# Patient Record
Sex: Male | Born: 1965 | Race: Black or African American | Hispanic: No | Marital: Single | Smoking: Former smoker
Health system: Southern US, Community
[De-identification: ages and names within clinical notes are randomized; demographics above are authoritative.]

## PROBLEM LIST (undated history)

## (undated) DIAGNOSIS — G43909 Migraine, unspecified, not intractable, without status migrainosus: Secondary | ICD-10-CM

## (undated) DIAGNOSIS — F10239 Alcohol dependence with withdrawal, unspecified: Secondary | ICD-10-CM

## (undated) DIAGNOSIS — Z89429 Acquired absence of other toe(s), unspecified side: Secondary | ICD-10-CM

## (undated) DIAGNOSIS — I671 Cerebral aneurysm, nonruptured: Secondary | ICD-10-CM

## (undated) DIAGNOSIS — M1990 Unspecified osteoarthritis, unspecified site: Secondary | ICD-10-CM

## (undated) DIAGNOSIS — F10939 Alcohol use, unspecified with withdrawal, unspecified: Secondary | ICD-10-CM

## (undated) DIAGNOSIS — K7031 Alcoholic cirrhosis of liver with ascites: Secondary | ICD-10-CM

## (undated) DIAGNOSIS — R569 Unspecified convulsions: Secondary | ICD-10-CM

## (undated) DIAGNOSIS — Z7289 Other problems related to lifestyle: Secondary | ICD-10-CM

## (undated) HISTORY — DX: Unspecified osteoarthritis, unspecified site: M19.90

## (undated) HISTORY — DX: Alcohol dependence with withdrawal, unspecified: F10.239

## (undated) HISTORY — DX: Cerebral aneurysm, nonruptured: I67.1

## (undated) HISTORY — PX: TOE AMPUTATION: SHX809

## (undated) HISTORY — DX: Alcohol use, unspecified with withdrawal, unspecified: F10.939

## (undated) HISTORY — PX: POLYPECTOMY: SHX149

## (undated) HISTORY — PX: COLONOSCOPY: SHX174

## (undated) HISTORY — DX: Acquired absence of other toe(s), unspecified side: Z89.429

## (undated) HISTORY — PX: OTHER SURGICAL HISTORY: SHX169

## (undated) HISTORY — DX: Unspecified convulsions: R56.9

## (undated) HISTORY — DX: Alcoholic cirrhosis of liver with ascites: K70.31

---
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
## 1988-12-11 HISTORY — PX: BACK SURGERY: SHX140

## 1999-07-20 ENCOUNTER — Emergency Department (HOSPITAL_COMMUNITY): Admission: EM | Admit: 1999-07-20 | Discharge: 1999-07-20 | Payer: Self-pay | Admitting: Emergency Medicine

## 1999-07-21 ENCOUNTER — Encounter: Payer: Self-pay | Admitting: Nephrology

## 1999-07-21 ENCOUNTER — Ambulatory Visit (HOSPITAL_COMMUNITY): Admission: RE | Admit: 1999-07-21 | Discharge: 1999-07-21 | Payer: Self-pay | Admitting: Nephrology

## 1999-12-19 ENCOUNTER — Emergency Department (HOSPITAL_COMMUNITY): Admission: EM | Admit: 1999-12-19 | Discharge: 1999-12-19 | Payer: Self-pay | Admitting: Emergency Medicine

## 2001-09-21 ENCOUNTER — Encounter: Payer: Self-pay | Admitting: Emergency Medicine

## 2001-09-21 ENCOUNTER — Emergency Department (HOSPITAL_COMMUNITY): Admission: EM | Admit: 2001-09-21 | Discharge: 2001-09-21 | Payer: Self-pay | Admitting: Emergency Medicine

## 2002-11-09 ENCOUNTER — Encounter: Payer: Self-pay | Admitting: Emergency Medicine

## 2002-11-09 ENCOUNTER — Emergency Department (HOSPITAL_COMMUNITY): Admission: EM | Admit: 2002-11-09 | Discharge: 2002-11-09 | Payer: Self-pay | Admitting: Emergency Medicine

## 2004-09-05 ENCOUNTER — Emergency Department (HOSPITAL_COMMUNITY): Admission: EM | Admit: 2004-09-05 | Discharge: 2004-09-05 | Payer: Self-pay | Admitting: Emergency Medicine

## 2004-10-25 ENCOUNTER — Emergency Department (HOSPITAL_COMMUNITY): Admission: EM | Admit: 2004-10-25 | Discharge: 2004-10-25 | Payer: Self-pay | Admitting: Emergency Medicine

## 2005-03-27 ENCOUNTER — Inpatient Hospital Stay (HOSPITAL_COMMUNITY): Admission: RE | Admit: 2005-03-27 | Discharge: 2005-03-31 | Payer: Self-pay | Admitting: Psychiatry

## 2005-03-27 ENCOUNTER — Ambulatory Visit: Payer: Self-pay | Admitting: Psychiatry

## 2005-06-10 ENCOUNTER — Emergency Department (HOSPITAL_COMMUNITY): Admission: EM | Admit: 2005-06-10 | Discharge: 2005-06-10 | Payer: Self-pay | Admitting: Emergency Medicine

## 2005-12-28 ENCOUNTER — Emergency Department (HOSPITAL_COMMUNITY): Admission: EM | Admit: 2005-12-28 | Discharge: 2005-12-28 | Payer: Self-pay | Admitting: Emergency Medicine

## 2006-06-17 ENCOUNTER — Emergency Department (HOSPITAL_COMMUNITY): Admission: EM | Admit: 2006-06-17 | Discharge: 2006-06-17 | Payer: Self-pay | Admitting: *Deleted

## 2014-06-15 ENCOUNTER — Emergency Department (HOSPITAL_COMMUNITY): Payer: Medicaid Other

## 2014-06-15 ENCOUNTER — Emergency Department (HOSPITAL_COMMUNITY)
Admission: EM | Admit: 2014-06-15 | Discharge: 2014-06-16 | Disposition: A | Discharge status: Home / Self Care | Payer: Medicaid Other | Attending: Emergency Medicine | Admitting: Emergency Medicine

## 2014-06-15 ENCOUNTER — Encounter (HOSPITAL_COMMUNITY): Payer: Self-pay | Admitting: Emergency Medicine

## 2014-06-15 DIAGNOSIS — IMO0002 Reserved for concepts with insufficient information to code with codable children: Secondary | ICD-10-CM | POA: Insufficient documentation

## 2014-06-15 DIAGNOSIS — S8990XA Unspecified injury of unspecified lower leg, initial encounter: Secondary | ICD-10-CM | POA: Insufficient documentation

## 2014-06-15 DIAGNOSIS — R296 Repeated falls: Secondary | ICD-10-CM | POA: Insufficient documentation

## 2014-06-15 DIAGNOSIS — S0990XA Unspecified injury of head, initial encounter: Secondary | ICD-10-CM | POA: Insufficient documentation

## 2014-06-15 DIAGNOSIS — S199XXA Unspecified injury of neck, initial encounter: Secondary | ICD-10-CM | POA: Diagnosis not present

## 2014-06-15 DIAGNOSIS — Z79899 Other long term (current) drug therapy: Secondary | ICD-10-CM | POA: Insufficient documentation

## 2014-06-15 DIAGNOSIS — F1010 Alcohol abuse, uncomplicated: Secondary | ICD-10-CM | POA: Insufficient documentation

## 2014-06-15 DIAGNOSIS — I671 Cerebral aneurysm, nonruptured: Secondary | ICD-10-CM | POA: Insufficient documentation

## 2014-06-15 DIAGNOSIS — F17200 Nicotine dependence, unspecified, uncomplicated: Secondary | ICD-10-CM | POA: Diagnosis not present

## 2014-06-15 DIAGNOSIS — Y9389 Activity, other specified: Secondary | ICD-10-CM | POA: Diagnosis not present

## 2014-06-15 DIAGNOSIS — F10920 Alcohol use, unspecified with intoxication, uncomplicated: Secondary | ICD-10-CM

## 2014-06-15 DIAGNOSIS — Y92410 Unspecified street and highway as the place of occurrence of the external cause: Secondary | ICD-10-CM | POA: Diagnosis not present

## 2014-06-15 DIAGNOSIS — S0993XA Unspecified injury of face, initial encounter: Secondary | ICD-10-CM | POA: Diagnosis not present

## 2014-06-15 DIAGNOSIS — Z9889 Other specified postprocedural states: Secondary | ICD-10-CM | POA: Insufficient documentation

## 2014-06-15 DIAGNOSIS — S99919A Unspecified injury of unspecified ankle, initial encounter: Secondary | ICD-10-CM

## 2014-06-15 DIAGNOSIS — S99929A Unspecified injury of unspecified foot, initial encounter: Secondary | ICD-10-CM

## 2014-06-15 LAB — I-STAT CHEM 8, ED
BUN: 4 mg/dL — ABNORMAL LOW (ref 6–23)
CALCIUM ION: 1.09 mmol/L — AB (ref 1.12–1.23)
CREATININE: 1.1 mg/dL (ref 0.50–1.35)
Chloride: 100 mEq/L (ref 96–112)
GLUCOSE: 89 mg/dL (ref 70–99)
HCT: 40 % (ref 39.0–52.0)
HEMOGLOBIN: 13.6 g/dL (ref 13.0–17.0)
Potassium: 3.5 mEq/L — ABNORMAL LOW (ref 3.7–5.3)
SODIUM: 140 meq/L (ref 137–147)
TCO2: 24 mmol/L (ref 0–100)

## 2014-06-15 LAB — ETHANOL: Alcohol, Ethyl (B): 365 mg/dL — ABNORMAL HIGH (ref 0–11)

## 2014-06-15 MED ORDER — IOHEXOL 350 MG/ML SOLN
100.0000 mL | Freq: Once | INTRAVENOUS | Status: AC | PRN
  Administered 2014-06-15: 100 mL via INTRAVENOUS
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
## 2014-06-15 NOTE — ED Notes (Signed)
Pt. Returned from radiology.

## 2014-06-16 NOTE — Discharge Instructions (Signed)
Alcohol Intoxication Follow up with Dr. Estanislado Pandy regarding the possible aneurysm in your brain. Return to the ED if you develop new or worsening symptoms. Alcohol intoxication occurs when the amount of alcohol that a person has consumed impairs his or her ability to mentally and physically function. Alcohol directly impairs the normal chemical activity of the brain. Drinking large amounts of alcohol can lead to changes in mental function and behavior, and it can cause many physical effects that can be harmful.  Alcohol intoxication can range in severity from mild to very severe. Various factors can affect the level of intoxication that occurs, such as the person's age, gender, weight, frequency of alcohol consumption, and the presence of other medical conditions (such as diabetes, seizures, or heart conditions). Dangerous levels of alcohol intoxication may occur when people drink large amounts of alcohol in a short period (binge drinking). Alcohol can also be especially dangerous when combined with certain prescription medicines or "recreational" drugs. SIGNS AND SYMPTOMS Some common signs and symptoms of mild alcohol intoxication include:  Loss of coordination.  Changes in mood and behavior.  Impaired judgment.  Slurred speech. As alcohol intoxication progresses to more severe levels, other signs and symptoms will appear. These may include:  Vomiting.  Confusion and impaired memory.  Slowed breathing.  Seizures.  Loss of consciousness. DIAGNOSIS  Your health care provider will take a medical history and perform a physical exam. You will be asked about the amount and type of alcohol you have consumed. Blood tests will be done to measure the concentration of alcohol in your blood. In many places, your blood alcohol level must be lower than 80 mg/dL (0.08%) to legally drive. However, many dangerous effects of alcohol can occur at much lower levels.  TREATMENT  People with alcohol  intoxication often do not require treatment. Most of the effects of alcohol intoxication are temporary, and they go away as the alcohol naturally leaves the body. Your health care provider will monitor your condition until you are stable enough to go home. Fluids are sometimes given through an IV access tube to help prevent dehydration.  HOME CARE INSTRUCTIONS  Do not drive after drinking alcohol.  Stay hydrated. Drink enough water and fluids to keep your urine clear or pale yellow. Avoid caffeine.   Only take over-the-counter or prescription medicines as directed by your health care provider.  SEEK MEDICAL CARE IF:   You have persistent vomiting.   You do not feel better after a few days.  You have frequent alcohol intoxication. Your health care provider can help determine if you should see a substance use treatment counselor. SEEK IMMEDIATE MEDICAL CARE IF:   You become shaky or tremble when you try to stop drinking.   You shake uncontrollably (seizure).   You throw up (vomit) blood. This may be bright red or may look like black coffee grounds.   You have blood in your stool. This may be bright red or may appear as a black, tarry, bad smelling stool.   You become lightheaded or faint.  MAKE SURE YOU:   Understand these instructions.  Will watch your condition.  Will get help right away if you are not doing well or get worse. Document Released: 09/06/2005 Document Revised: 07/30/2013 Document Reviewed: 05/02/2013 [facility] Patient Information 2015 [place], [place]. This information is not intended to replace advice given to you by your health care provider. Make sure you discuss any questions you have with your health care provider.

## 2014-06-16 NOTE — ED Notes (Signed)
Pt ambulated with no difficulty

## 2014-06-25 ENCOUNTER — Other Ambulatory Visit (HOSPITAL_COMMUNITY): Payer: Self-pay | Admitting: Emergency Medicine

## 2014-06-25 ENCOUNTER — Telehealth (HOSPITAL_COMMUNITY): Payer: Self-pay | Admitting: Interventional Radiology

## 2014-06-25 DIAGNOSIS — I729 Aneurysm of unspecified site: Secondary | ICD-10-CM

## 2014-06-25 NOTE — Telephone Encounter (Signed)
Called pt, spoke to his mother. Asked her to have pt call me to schedule a consultation to discuss his recent incidental finding of a RMCA aneurysm. She states she will have him return my call. JM

## 2014-07-13 ENCOUNTER — Ambulatory Visit (HOSPITAL_COMMUNITY): Payer: No Typology Code available for payment source

## 2014-08-11 ENCOUNTER — Ambulatory Visit (HOSPITAL_COMMUNITY): Admission: RE | Admit: 2014-08-11 | Payer: No Typology Code available for payment source | Source: Ambulatory Visit

## 2014-08-14 ENCOUNTER — Ambulatory Visit (HOSPITAL_COMMUNITY): Admission: RE | Admit: 2014-08-14 | Payer: No Typology Code available for payment source | Source: Ambulatory Visit

## 2014-09-01 ENCOUNTER — Telehealth (HOSPITAL_COMMUNITY): Payer: Self-pay | Admitting: Interventional Radiology

## 2014-09-01 NOTE — Telephone Encounter (Signed)
Attempted to call pt again to reschedule his consult for new dx of aneurysm. No answer and no VM. I also called the other number listed on his chart which belongs to his aunt, Ms. Wynetta Emery. She stated that the best way to contact Mr. Careaga would be to call his mother's home phone. I will try back. JM

## 2015-08-07 ENCOUNTER — Inpatient Hospital Stay (HOSPITAL_COMMUNITY): Payer: Medicaid Other

## 2015-08-07 ENCOUNTER — Inpatient Hospital Stay (HOSPITAL_COMMUNITY)
Admission: EM | Admit: 2015-08-07 | Discharge: 2015-08-10 | DRG: 897 | Disposition: A | Discharge status: Home / Self Care | Payer: Medicaid Other | Attending: Internal Medicine | Admitting: Internal Medicine

## 2015-08-07 ENCOUNTER — Encounter (HOSPITAL_COMMUNITY): Payer: Self-pay | Admitting: Emergency Medicine

## 2015-08-07 DIAGNOSIS — K920 Hematemesis: Secondary | ICD-10-CM

## 2015-08-07 DIAGNOSIS — I671 Cerebral aneurysm, nonruptured: Secondary | ICD-10-CM

## 2015-08-07 DIAGNOSIS — F1010 Alcohol abuse, uncomplicated: Secondary | ICD-10-CM | POA: Diagnosis not present

## 2015-08-07 DIAGNOSIS — F10239 Alcohol dependence with withdrawal, unspecified: Secondary | ICD-10-CM | POA: Diagnosis present

## 2015-08-07 DIAGNOSIS — F17210 Nicotine dependence, cigarettes, uncomplicated: Secondary | ICD-10-CM | POA: Diagnosis present

## 2015-08-07 DIAGNOSIS — F1290 Cannabis use, unspecified, uncomplicated: Secondary | ICD-10-CM | POA: Diagnosis present

## 2015-08-07 DIAGNOSIS — F10230 Alcohol dependence with withdrawal, uncomplicated: Secondary | ICD-10-CM | POA: Diagnosis not present

## 2015-08-07 DIAGNOSIS — E872 Acidosis: Secondary | ICD-10-CM

## 2015-08-07 DIAGNOSIS — E871 Hypo-osmolality and hyponatremia: Secondary | ICD-10-CM | POA: Diagnosis present

## 2015-08-07 DIAGNOSIS — R569 Unspecified convulsions: Secondary | ICD-10-CM | POA: Insufficient documentation

## 2015-08-07 DIAGNOSIS — Z79899 Other long term (current) drug therapy: Secondary | ICD-10-CM

## 2015-08-07 DIAGNOSIS — E860 Dehydration: Secondary | ICD-10-CM | POA: Diagnosis present

## 2015-08-07 DIAGNOSIS — I10 Essential (primary) hypertension: Secondary | ICD-10-CM | POA: Diagnosis present

## 2015-08-07 DIAGNOSIS — F10939 Alcohol use, unspecified with withdrawal, unspecified: Secondary | ICD-10-CM | POA: Insufficient documentation

## 2015-08-07 DIAGNOSIS — E876 Hypokalemia: Secondary | ICD-10-CM | POA: Diagnosis present

## 2015-08-07 DIAGNOSIS — E8729 Other acidosis: Secondary | ICD-10-CM

## 2015-08-07 LAB — COMPREHENSIVE METABOLIC PANEL
ALT: 53 U/L (ref 17–63)
ANION GAP: 17 — AB (ref 5–15)
AST: 99 U/L — AB (ref 15–41)
Albumin: 4.2 g/dL (ref 3.5–5.0)
Alkaline Phosphatase: 69 U/L (ref 38–126)
BILIRUBIN TOTAL: 0.7 mg/dL (ref 0.3–1.2)
CHLORIDE: 91 mmol/L — AB (ref 101–111)
CO2: 23 mmol/L (ref 22–32)
Calcium: 9.5 mg/dL (ref 8.9–10.3)
Creatinine, Ser: 0.66 mg/dL (ref 0.61–1.24)
GFR calc Af Amer: >60 mL/min (ref >60)
Glucose, Bld: 158 mg/dL — ABNORMAL HIGH (ref 65–99)
POTASSIUM: 3.2 mmol/L — AB (ref 3.5–5.1)
Sodium: 131 mmol/L — ABNORMAL LOW (ref 135–145)
TOTAL PROTEIN: 9.2 g/dL — AB (ref 6.5–8.1)

## 2015-08-07 LAB — PHOSPHORUS: PHOSPHORUS: 3.1 mg/dL (ref 2.5–4.6)

## 2015-08-07 LAB — CBC
HEMATOCRIT: 33.9 % — AB (ref 39.0–52.0)
HEMOGLOBIN: 11.9 g/dL — AB (ref 13.0–17.0)
MCH: 27.6 pg (ref 26.0–34.0)
MCHC: 35.1 g/dL (ref 30.0–36.0)
MCV: 78.7 fL (ref 78.0–100.0)
Platelets: 132 10*3/uL — ABNORMAL LOW (ref 150–400)
RBC: 4.31 MIL/uL (ref 4.22–5.81)
RDW: 15.3 % (ref 11.5–15.5)
WBC: 5 10*3/uL (ref 4.0–10.5)

## 2015-08-07 LAB — URINALYSIS, ROUTINE W REFLEX MICROSCOPIC
BILIRUBIN URINE: NEGATIVE
Glucose, UA: NEGATIVE mg/dL
Ketones, ur: NEGATIVE mg/dL
LEUKOCYTES UA: NEGATIVE
NITRITE: NEGATIVE
PH: 7 (ref 5.0–8.0)
Protein, ur: 100 mg/dL — AB
SPECIFIC GRAVITY, URINE: 1.007 (ref 1.005–1.030)
UROBILINOGEN UA: 1 mg/dL (ref 0.0–1.0)

## 2015-08-07 LAB — RAPID URINE DRUG SCREEN, HOSP PERFORMED
AMPHETAMINES: NOT DETECTED
Barbiturates: NOT DETECTED
Benzodiazepines: POSITIVE — AB
Cocaine: NOT DETECTED
OPIATES: NOT DETECTED
Tetrahydrocannabinol: POSITIVE — AB

## 2015-08-07 LAB — MAGNESIUM: MAGNESIUM: 1.9 mg/dL (ref 1.7–2.4)

## 2015-08-07 LAB — URINE MICROSCOPIC-ADD ON

## 2015-08-07 LAB — ETHANOL

## 2015-08-07 LAB — LIPASE, BLOOD: LIPASE: 52 U/L — AB (ref 22–51)

## 2015-08-07 MED ORDER — LORAZEPAM 2 MG/ML IJ SOLN: 0.0000 mg | Freq: Two times a day (BID) | INTRAMUSCULAR | Status: DC

## 2015-08-07 MED ORDER — VITAMIN B-1 100 MG PO TABS
100.0000 mg | ORAL_TABLET | Freq: Every day | ORAL | Status: DC
  Administered 2015-08-08 – 2015-08-10 (×3): 100 mg via ORAL
  Filled 2015-08-07 (×3): qty 1

## 2015-08-07 MED ORDER — SODIUM CHLORIDE 0.9 % IJ SOLN
3.0000 mL | Freq: Two times a day (BID) | INTRAMUSCULAR | Status: DC
  Administered 2015-08-07 – 2015-08-08 (×3): 3 mL via INTRAVENOUS

## 2015-08-07 MED ORDER — FOLIC ACID 1 MG PO TABS
1.0000 mg | ORAL_TABLET | Freq: Every day | ORAL | Status: DC
  Administered 2015-08-07 – 2015-08-08 (×2): 1 mg via ORAL
  Filled 2015-08-07 (×2): qty 1

## 2015-08-07 MED ORDER — SODIUM CHLORIDE 0.9 % IV BOLUS (SEPSIS)
1000.0000 mL | Freq: Once | INTRAVENOUS | Status: AC
  Administered 2015-08-07: 1000 mL via INTRAVENOUS

## 2015-08-07 MED ORDER — THIAMINE HCL 100 MG/ML IJ SOLN
100.0000 mg | Freq: Every day | INTRAMUSCULAR | Status: DC
  Administered 2015-08-07: 100 mg via INTRAVENOUS
  Filled 2015-08-07 (×3): qty 2

## 2015-08-07 MED ORDER — ONDANSETRON HCL 4 MG/2ML IJ SOLN
4.0000 mg | Freq: Once | INTRAMUSCULAR | Status: AC
  Administered 2015-08-07: 4 mg via INTRAVENOUS
  Filled 2015-08-07: qty 2

## 2015-08-07 MED ORDER — ENOXAPARIN SODIUM 40 MG/0.4ML ~~LOC~~ SOLN
40.0000 mg | SUBCUTANEOUS | Status: DC
  Administered 2015-08-07 – 2015-08-09 (×3): 40 mg via SUBCUTANEOUS
  Filled 2015-08-07 (×3): qty 0.4

## 2015-08-07 MED ORDER — LORAZEPAM 0.5 MG PO TABS
1.0000 mg | ORAL_TABLET | Freq: Four times a day (QID) | ORAL | Status: DC | PRN
  Administered 2015-08-08: 1 mg via ORAL
  Filled 2015-08-07: qty 1

## 2015-08-07 MED ORDER — LORAZEPAM 2 MG/ML IJ SOLN
2.0000 mg | Freq: Once | INTRAMUSCULAR | Status: AC
Start: 2015-08-07 — End: 2015-08-07
  Administered 2015-08-07: 2 mg via INTRAVENOUS

## 2015-08-07 MED ORDER — HYDRALAZINE HCL 10 MG PO TABS: 10.0000 mg | ORAL_TABLET | Freq: Three times a day (TID) | ORAL | Status: DC

## 2015-08-07 MED ORDER — LORAZEPAM 2 MG/ML IJ SOLN
INTRAMUSCULAR | Status: AC
  Filled 2015-08-07: qty 1

## 2015-08-07 MED ORDER — LORAZEPAM 2 MG/ML IJ SOLN
0.0000 mg | Freq: Four times a day (QID) | INTRAMUSCULAR | Status: DC
Start: 2015-08-07 — End: 2015-08-07

## 2015-08-07 MED ORDER — ADULT MULTIVITAMIN W/MINERALS CH
1.0000 | ORAL_TABLET | Freq: Every day | ORAL | Status: DC
  Administered 2015-08-07 – 2015-08-08 (×2): 1 via ORAL
  Filled 2015-08-07 (×2): qty 1

## 2015-08-07 MED ORDER — LORAZEPAM 2 MG/ML IJ SOLN
2.0000 mg | Freq: Once | INTRAMUSCULAR | Status: AC
  Administered 2015-08-07: 2 mg via INTRAVENOUS
  Filled 2015-08-07: qty 1

## 2015-08-07 MED ORDER — LORAZEPAM 2 MG/ML IJ SOLN
1.0000 mg | Freq: Four times a day (QID) | INTRAMUSCULAR | Status: DC | PRN
  Administered 2015-08-07: 1 mg via INTRAVENOUS
  Administered 2015-08-08: 2 mg via INTRAVENOUS
  Administered 2015-08-08 (×3): 1 mg via INTRAVENOUS
  Filled 2015-08-07 (×4): qty 1

## 2015-08-07 MED ORDER — ONDANSETRON 4 MG PO TBDP
4.0000 mg | ORAL_TABLET | Freq: Once | ORAL | Status: AC | PRN
  Administered 2015-08-07: 4 mg via ORAL

## 2015-08-07 MED ORDER — LORAZEPAM 1 MG PO TABS: 0.0000 mg | ORAL_TABLET | Freq: Two times a day (BID) | ORAL | Status: DC

## 2015-08-07 MED ORDER — HYDRALAZINE HCL 20 MG/ML IJ SOLN
5.0000 mg | INTRAMUSCULAR | Status: DC | PRN
  Administered 2015-08-07 – 2015-08-09 (×4): 5 mg via INTRAVENOUS
  Filled 2015-08-07 (×5): qty 1

## 2015-08-07 MED ORDER — SODIUM CHLORIDE 0.9 % IJ SOLN
3.0000 mL | Freq: Two times a day (BID) | INTRAMUSCULAR | Status: DC
  Administered 2015-08-08 – 2015-08-09 (×4): 3 mL via INTRAVENOUS

## 2015-08-07 MED ORDER — POTASSIUM CHLORIDE 10 MEQ/100ML IV SOLN
10.0000 meq | INTRAVENOUS | Status: AC
  Administered 2015-08-07 (×4): 10 meq via INTRAVENOUS
  Filled 2015-08-07 (×3): qty 100

## 2015-08-07 MED ORDER — ONDANSETRON 4 MG PO TBDP
ORAL_TABLET | ORAL | Status: AC
  Filled 2015-08-07: qty 1

## 2015-08-07 MED ORDER — LORAZEPAM 1 MG PO TABS: 0.0000 mg | ORAL_TABLET | Freq: Four times a day (QID) | ORAL | Status: DC

## 2015-08-07 NOTE — ED Notes (Signed)
Upon entering to speak with patient further and administer nausea medication this RN noted that patient has a severe tremor in his extremities. When asked patient reports daily use of 40oz of ETOH. Last use yesterday @ 1700. Currently beginning to feel nauseated again. Patient could not take medication cup from this RN because of tremor. CIWA completed. Acuity increased.

## 2015-08-07 NOTE — ED Notes (Signed)
Patient here via EMS with complaint of nausea and hematemesis x1. Denies chest pain, abdominal pain.

## 2015-08-07 NOTE — ED Notes (Signed)
Patient resting; visitor at bedside; wants some food; will let Billy Fischer, MD aware

## 2015-08-07 NOTE — Consult Note (Signed)
Internal Medicine Teaching Service Note  We saw Stephen Carroll in the emergency department at 1100 with plans of admitting him for alcohol withdrawa. When we saw the patient, he had decided to leave against medical advice. After a lengthy discussion, as documented below, we surmised that he had the competence to make this decision.  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 In the emergency department, he was hypertensive to 190/110, tachycardic to low 100s, and a nurse noted he was tremulous. His last drink of alcohol was 2 days ago, and he usually drinks 3-4 beers per day per his report. There was mention that he was coughing up blood but he insisted this was a misunderstanding and the red color was actually from his [employer].  When we saw the patient, he amadently refused to be admitted to the hospital, stating he wanted to go home to his mother. We explained the risks of leaving, specifically that might die because he is withdrawing from alcohol, or perhaps asphyxiating on his own blood if he did indeed have an esophageal tear, and he acknowledged and repeated these risks but insisted that he go home. He was alert to his name, the date and year, the preand sident, his address, and why he came to the emergency room, and we decided he had full competence to leave against medical advice. We spoke with Dr. Billy Fischer who said she would fill out the [facility] paperwork and discharge the patient from the emergency room.  Loleta Chance, MD

## 2015-08-07 NOTE — ED Provider Notes (Signed)
CSN: [id number]     Arrival date & time 08/07/15  0435 History   First MD Initiated Contact with Patient 08/07/15 [phone]     Chief Complaint  Patient presents with  . Nausea  . Hematemesis     (Consider location/radiation/quality/duration/timing/severity/associated sxs/prior Treatment) HPI Comments:  49 year old male who presents to the emergency department with alcohol withdrawal.  Reportedly patient reported nausea and vomiting in triage, however on my evaluation patient does not remember why he is in the emergency department, is altered, and tremulous. Reports his last drink was the day before yesterday, and reports a history of alcohol withdrawal however no history of alcohol withdrawal seizures.  Denied hallucinations.  Patient is answering questions but unclear if history is reliable given pt does not remember why he came to the ED.  Reports he is shaking a lot.  Acknowledges he told triage about emesis. Denies hematemesis however.  Patient is a 49 y.o. male presenting with alcohol problem and neurologic complaint.  Alcohol Problem Pertinent negatives include no chest pain, no abdominal pain, no headaches and no shortness of breath.  Neurologic Problem This is a new problem. Episode onset: does not remember. The problem occurs constantly. The problem has not changed since onset.Pertinent negatives include no chest pain, no abdominal pain, no headaches and no shortness of breath. Associated symptoms comments: Shaking of body, extremities (tremors). Nothing aggravates the symptoms. Nothing relieves the symptoms. He has tried nothing for the symptoms.    History reviewed. No pertinent past medical history. Past Surgical History  Procedure Laterality Date  . Back surgery      Pt. reports rods placed in back   History reviewed. No pertinent family history. Social History  Substance Use Topics  . Smoking status: Current Some Day Smoker -- 0.00 packs/day    Types: Cigarettes  . Smokeless  tobacco: None  . Alcohol Use: Yes     Comment: occasional    Review of Systems  Constitutional: Negative for fever.  HENT: Negative for sore throat.   Eyes: Negative for visual disturbance.  Respiratory: Negative for shortness of breath.   Cardiovascular: Negative for chest pain.  Gastrointestinal: Positive for nausea and vomiting. Negative for abdominal pain and diarrhea.  Genitourinary: Negative for difficulty urinating.  Musculoskeletal: Negative for back pain and neck stiffness.  Skin: Negative for rash.  Neurological: Negative for syncope and headaches.      Allergies  Review of patient's allergies indicates no known allergies.  Home Medications   Prior to Admission medications   Not on File   BP 169/94 mmHg  Pulse 99  Temp(Src) 98.9 F (37.2 C) (Oral)  Resp 17  Ht 5\' 8"  (1.727 m)  Wt 152 lb (68.947 kg)  BMI 23.12 kg/m2  SpO2 100% Physical Exam  Constitutional: He appears well-developed and well-nourished. No distress.  HENT:  Head: Normocephalic and atraumatic.  Eyes: Conjunctivae and EOM are normal. Pupils are equal, round, and reactive to light.  Neck: Normal range of motion.  Cardiovascular: Normal rate, regular rhythm, normal heart sounds and intact distal pulses.  Exam reveals no gallop and no friction rub.   No murmur heard. Pulmonary/Chest: Effort normal and breath sounds normal. No respiratory distress. He has no wheezes. He has no rales.  Abdominal: Soft. He exhibits no distension. There is no tenderness. There is no guarding.  Musculoskeletal: He exhibits no edema.  Neurological: He is alert. He displays tremor. He displays seizure activity (pt with seizure during examination 40 sec). GCS eye subscore  is 4. GCS verbal subscore is 5. GCS motor subscore is 6.  Oriented to self, knows in hospital, does not know which one, does not know day/year  Skin: Skin is warm. No rash noted. He is diaphoretic.  Nursing note and vitals reviewed.   ED Course   Procedures (including critical care time) Labs Review Labs Reviewed  LIPASE, BLOOD - Abnormal; Notable for the following:    Lipase 52 (*)    All other components within normal limits  COMPREHENSIVE METABOLIC PANEL - Abnormal; Notable for the following:    Sodium 131 (*)    Potassium 3.2 (*)    Chloride 91 (*)    Glucose, Bld 158 (*)    BUN <5 (*)    Total Protein 9.2 (*)    AST 99 (*)    Anion gap 17 (*)    All other components within normal limits  CBC - Abnormal; Notable for the following:    Hemoglobin 11.9 (*)    HCT 33.9 (*)    Platelets 132 (*)    All other components within normal limits  URINALYSIS, ROUTINE W REFLEX MICROSCOPIC (NOT AT [facility]) - Abnormal; Notable for the following:    Hgb urine dipstick SMALL (*)    Protein, ur 100 (*)    All other components within normal limits  URINE RAPID DRUG SCREEN, HOSP PERFORMED - Abnormal; Notable for the following:    Benzodiazepines POSITIVE (*)    Tetrahydrocannabinol POSITIVE (*)    All other components within normal limits  MAGNESIUM  PHOSPHORUS  URINE MICROSCOPIC-ADD ON  ETHANOL  BASIC METABOLIC PANEL    Imaging Review Dg Chest 2 View  08/07/2015   CLINICAL DATA:  49 year old male with history of an alcohol withdrawal seizure.  EXAM: CHEST  2 VIEW  COMPARISON:  Chest x-ray 06/15/2014.  FINDINGS: Lung volumes are normal. No consolidative airspace disease. No pleural effusions. No pneumothorax. No pulmonary nodule or mass noted. Pulmonary vasculature and the cardiomediastinal silhouette are within normal limits. 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  IMPRESSION: 1. No radiographic evidence of acute cardiopulmonary disease.   Electronically Signed   By: Vinnie Langton M.D.   On: 08/07/2015 15:12   I have personally reviewed and evaluated these images and lab results as part of my medical decision-making.   EKG  Interpretation   Date/Time:  Saturday August 07 2015 07:30:21 EDT Ventricular Rate:  110 PR Interval:  137 QRS Duration: 103 QT Interval:  341 QTC Calculation: 461 R Axis:   23 Text Interpretation:  Sinus tachycardia Probable left atrial enlargement  RSR' in V1 or V2, right VCD or RVH Left ventricular hypertrophy No  previous ECGs available Confirmed by [facility] MD, Verdell Kincannon ([id number]) on  08/07/2015 8:34:56 PM      MDM   Final diagnoses:  Alcohol withdrawal seizure   49 year old male who presents to the emergency department with alcohol withdrawal.  Reportedly patient reported nausea and vomiting in triage, however on my evaluation patient does not remember why he is in the emergency department, is altered, and tremulous. Reports his last drink was the day before yesterday, and reports a history of alcohol withdrawal however no history of alcohol withdrawal seizures.  Denied hallucinations. Patient was significant tremors of my initial exam, with tachycardia and hypertension and nursing was found to give patient 2 mg of Ativan. After receiving the 2 mg of Ativan as I continued my evaluation, patient developed a  grand mal tonic-clonic seizure lasting approximately 40 seconds for which he was given additional emergent Ativan. CIWA protocol was ordered.  Patient's mental status improved while he was in the emergency department. Internal medicine was consulted for admission for alcohol withdrawal with withdrawal seizures, and patient initially declined admission at which time he was alert and oriented. Patient's girlfriend came to the emergency department and convinced patient to stay in the hospital and he was admitted to stepdown for further care.   CRITICAL CARE: ETOH WITHDRAWAL SEIZURES Performed by: Alvino Chapel   Total critical care time: 1min  Critical care time was exclusive of separately billable procedures and treating other patients.  Critical care was necessary to  treat or prevent imminent or life-threatening deterioration.  Critical care was time spent personally by me on the following activities: development of treatment plan with patient and/or surrogate as well as nursing, discussions with consultants, evaluation of patient's response to treatment, examination of patient, obtaining history from patient or surrogate, ordering and performing treatments and interventions, ordering and review of laboratory studies, ordering and review of radiographic studies, pulse oximetry and re-evaluation of patient's condition.   Gareth Morgan, MD 08/07/15 2039

## 2015-08-07 NOTE — H&P (Signed)
Date: 08/07/2015               Patient Name:  Stephen Carroll MRN: [id number]  DOB: 1965-12-15 Age / Sex: 49 y.o., male   PCP: No Pcp Per Patient         Medical Service: Internal Medicine Teaching Service         Attending Physician: Dr. Gareth Morgan, MD    First Contact: Dr. Loleta Chance Pager: [phone]  Second Contact: Dr. Duwaine Maxin Pager: [phone]       After Hours (After 5p/  First Contact Pager: [phone]  weekends / holidays): Second Contact Pager: [phone]   Chief Complaint: "I stopped drinking now I got the shakes."  History of Present Illness:  Stephen Carroll is a 42 year-old man with a history of alcohol abuse with no report of delirium tremens, and an incidentally-discovered thrombosed and calcified aneurysm in the right MCA discovered July 2015, who presented to the emergency department with shaking after he stopped drinking since yesterday at 2pm. He said he drinks about 3 beers a day; however, his girlfriend in the room shook her head and said he drinks "way more than that." He said he gets the shakes when he stops drinking but he's never had full-blown seizures or hallucinated. Other than alcohol, he smokes marijuana but doesn't use any other drugs. There also also a report from the emergency department physicians that he was throwing up blood, but he says this was from choking on a red [employer]. He adamently says he's never coughed up blood. Moreover, besides shaking, he denies hallucinations, vomiting, hematemesis, distended abdomen, focal weakness, change in vision, headache, blood in his stool, or any other complaints.  Meds: Current Facility-Administered Medications  Medication Dose Route Frequency Provider Last Rate Last Dose  . LORazepam (ATIVAN) injection 0-4 mg  0-4 mg Intravenous 4 times per day Gareth Morgan, MD   Stopped at 08/07/15 0820  . thiamine (B-1) injection 100 mg  100 mg Intravenous Daily Gareth Morgan, MD   100 mg at 08/07/15 0825  . thiamine  (VITAMIN B-1) tablet 100 mg  100 mg Oral Daily Gareth Morgan, MD   100 mg at 08/07/15 1232   No current outpatient prescriptions on file.    Allergies: Allergies as of 08/07/2015  . (No Known Allergies)   History reviewed. No pertinent past medical history. Past Surgical History  Procedure Laterality Date  . Back surgery      Pt. reports rods placed in back   History reviewed. No pertinent family history. Social History   Social History  . Marital Status: Single    Spouse Name: N/A  . Number of Children: N/A  . Years of Education: N/A   Occupational History  . Not on file.   Social History Main Topics  . Smoking status: Current Some Day Smoker  . Smokeless tobacco: Not on file  . Alcohol Use: Yes     Comment: occasional  . Drug Use: Yes    Special: Marijuana  . Sexual Activity: Not on file   Other Topics Concern  . Not on file   Social History Narrative   Review of Systems  Constitutional: Negative for fever, chills, weight loss and malaise/fatigue.  Eyes: Negative for blurred vision and double vision.  Respiratory: Negative for cough, hemoptysis and shortness of breath.   Cardiovascular: Negative for chest pain and palpitations.  Gastrointestinal: Negative for heartburn, nausea, vomiting and abdominal pain.  Musculoskeletal: Negative for myalgias.  Skin:  Negative for rash.  Neurological: Positive for tremors. Negative for dizziness, sensory change, focal weakness, seizures, loss of consciousness, weakness and headaches.  Psychiatric/Behavioral: Positive for substance abuse. Negative for hallucinations. The patient is not nervous/anxious.     Physical Exam: Blood pressure 164/98, pulse 90, temperature 98.9 F (37.2 C), temperature source Oral, resp. rate 15, height 5\' 8"  (1.727 m), weight 72.576 kg (160 lb), SpO2 100 %.  General: Tremulous while lying in bed HEENT: Muddy sclera but not icteric. Oral mucosa normal. Cardiac: Tachycardic but regular, no rubs,  murmurs or gallops Pulm: Clear to auscultation bilaterally, moving normal volumes of air Abd: Soft, nontender, nondistended, BS present Ext: Warm and well perfused, no pedal edema. No cirrhotic stigmata Neuro: Alert and oriented X3, cranial nerves II-XII intact, strength 5/5, intact to light touch throughout, no ataxia, pronator drift normal, no asterixis  Lab results: Basic Metabolic Panel:  Recent Labs  08/07/15 0457  NA 131*  K 3.2*  CL 91*  CO2 23  GLUCOSE 158*  BUN <5*  CREATININE 0.66  CALCIUM 9.5  MG 1.9  PHOS 3.1   Liver Function Tests:  Recent Labs  08/07/15 0457  AST 99*  ALT 53  ALKPHOS 69  BILITOT 0.7  PROT 9.2*  ALBUMIN 4.2    Recent Labs  08/07/15 0457  LIPASE 52*   CBC:  Recent Labs  08/07/15 0457  WBC 5.0  HGB 11.9*  HCT 33.9*  MCV 78.7  PLT 132*   Other results: EKG: Tachycardic sinus rhythm of 177m, borderline left axis deviation, left atrial enlargement, left ventricular hypertrophy, isolated inverted T-wave in aVR, otherwise non-ischemic. No prior EKG to compare  Assessment & Plan by Problem:  Stephen Carroll is a 16 year old man here for alcohol withdrawal. He'll be admitted with CIWA protocol. 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 He was confabulating a bit about the [employer] incident, which begs the question whether he actually did have hematemesis. We'll order a chest x-ray and follow him closely for a Mallory-Weiss tear or perhaps esophageal varices, although he has no cirrhotic stigmata on exam today. He also has this incidentally-discovered right MCA thrombosed aneurysm that is concerning given his hypertension but his neurologic exam is non-focal. We'll need to control his blood pressures but we'll hold on ordering a head CT at this time.  Alcohol withdrawal -CIWA protocol -Thiamine and folate -Will get social work involved -BMP tomorrow -Replete electrolytes as needed  Right thrombosed  MCA aneurysm -We'll keep his pressures under control with 5mg  hydralazine PRN for BP >170/100 -No head CT at this time -Will need to follow-up with neurosurgery for catheter-direct angiogram as an outpatient  Hematemesis versus red Jolly Rancher sputum -Ordered chest x-ray and we'll monitor him closely  Dispo: Disposition is deferred at this time, awaiting improvement of current medical problems.  The patient does not have a current PCP (No Pcp Per Patient) and does need an [facility] hospital follow-up appointment after discharge.  The patient does not know have transportation limitations that hinder transportation to clinic appointments.  Signed: Loleta Chance, MD 08/07/2015, 1:24 PM

## 2015-08-07 NOTE — Progress Notes (Signed)
Patient's BP elevated 180's/ 90's. Tachy 120's-140.Patient was out of bed to bedside commode during that time. Hydralazine PRN given. Ativan also given for his tremors. See flowsheet for vitals. MD aware.

## 2015-08-07 NOTE — ED Notes (Signed)
MD at bedside. 

## 2015-08-07 NOTE — Progress Notes (Signed)
Patient arrived on floor at approximately 1550 with wife. Patient alert oriented x3. Placed on tele box 2. Made comfortable in bed, needs attended. Currently eating his early dinner tray.

## 2015-08-07 NOTE — ED Notes (Signed)
Pt had an incontinent episode earlier. Bed changed and pt placed in new gown.

## 2015-08-07 NOTE — ED Notes (Signed)
Schlossman, MD aware of patient's concern; patient was told he can not have any food at this moment, he would need to wait to see what the internal medicine MD would want to do first; patient acknowledged and will wait; 2 warm blankets given; visitor at bedside; both are watching television at this time

## 2015-08-08 LAB — BASIC METABOLIC PANEL
Anion gap: 13 (ref 5–15)
Anion gap: 13 (ref 5–15)
CALCIUM: 9.4 mg/dL (ref 8.9–10.3)
CALCIUM: 9.5 mg/dL (ref 8.9–10.3)
CO2: 26 mmol/L (ref 22–32)
CO2: 23 mmol/L (ref 22–32)
CREATININE: 0.8 mg/dL (ref 0.61–1.24)
Chloride: 89 mmol/L — ABNORMAL LOW (ref 101–111)
Chloride: 92 mmol/L — ABNORMAL LOW (ref 101–111)
Creatinine, Ser: 0.68 mg/dL (ref 0.61–1.24)
GFR calc Af Amer: >60 mL/min (ref >60)
Glucose, Bld: 121 mg/dL — ABNORMAL HIGH (ref 65–99)
Glucose, Bld: 126 mg/dL — ABNORMAL HIGH (ref 65–99)
Potassium: 2.8 mmol/L — ABNORMAL LOW (ref 3.5–5.1)
Potassium: 3.2 mmol/L — ABNORMAL LOW (ref 3.5–5.1)
SODIUM: 128 mmol/L — AB (ref 135–145)
SODIUM: 128 mmol/L — AB (ref 135–145)

## 2015-08-08 LAB — CBC
HCT: 34.7 % — ABNORMAL LOW (ref 39.0–52.0)
Hemoglobin: 11.9 g/dL — ABNORMAL LOW (ref 13.0–17.0)
MCH: 27.8 pg (ref 26.0–34.0)
MCHC: 34.3 g/dL (ref 30.0–36.0)
MCV: 81.1 fL (ref 78.0–100.0)
PLATELETS: 117 10*3/uL — AB (ref 150–400)
RBC: 4.28 MIL/uL (ref 4.22–5.81)
RDW: 15.4 % (ref 11.5–15.5)
WBC: 6.4 10*3/uL (ref 4.0–10.5)

## 2015-08-08 LAB — MAGNESIUM: Magnesium: 1.9 mg/dL (ref 1.7–2.4)

## 2015-08-08 LAB — TROPONIN I

## 2015-08-08 MED ORDER — LORAZEPAM 2 MG/ML IJ SOLN
2.0000 mg | Freq: Once | INTRAMUSCULAR | Status: AC
  Administered 2015-08-08: 2 mg via INTRAVENOUS
  Filled 2015-08-08: qty 1

## 2015-08-08 MED ORDER — POTASSIUM CHLORIDE CRYS ER 20 MEQ PO TBCR
40.0000 meq | EXTENDED_RELEASE_TABLET | Freq: Two times a day (BID) | ORAL | Status: DC
  Administered 2015-08-08: 40 meq via ORAL
  Filled 2015-08-08: qty 2

## 2015-08-08 MED ORDER — LABETALOL HCL 5 MG/ML IV SOLN
5.0000 mg | Freq: Once | INTRAVENOUS | Status: AC
  Administered 2015-08-08: 5 mg via INTRAVENOUS
  Filled 2015-08-08: qty 4

## 2015-08-08 MED ORDER — INFLUENZA VAC SPLIT QUAD 0.5 ML IM SUSY
0.5000 mL | PREFILLED_SYRINGE | INTRAMUSCULAR | Status: AC
  Administered 2015-08-09: 0.5 mL via INTRAMUSCULAR
  Filled 2015-08-08: qty 0.5

## 2015-08-08 MED ORDER — SODIUM CHLORIDE 0.9 % IV SOLN
INTRAVENOUS | Status: AC
  Administered 2015-08-08 – 2015-08-09 (×2): via INTRAVENOUS

## 2015-08-08 MED ORDER — LORAZEPAM 2 MG/ML IJ SOLN
INTRAMUSCULAR | Status: AC
  Filled 2015-08-08: qty 1

## 2015-08-08 MED ORDER — LORAZEPAM 2 MG/ML IJ SOLN
2.0000 mg | INTRAMUSCULAR | Status: DC | PRN
  Administered 2015-08-08: 3 mg via INTRAVENOUS
  Administered 2015-08-09: 2 mg via INTRAVENOUS
  Administered 2015-08-09: 3 mg via INTRAVENOUS
  Filled 2015-08-08: qty 1
  Filled 2015-08-08 (×2): qty 2

## 2015-08-08 MED ORDER — POTASSIUM CHLORIDE CRYS ER 20 MEQ PO TBCR
40.0000 meq | EXTENDED_RELEASE_TABLET | Freq: Two times a day (BID) | ORAL | Status: AC
  Administered 2015-08-08: 40 meq via ORAL
  Filled 2015-08-08: qty 2

## 2015-08-08 MED ORDER — LORAZEPAM 2 MG/ML IJ SOLN
1.0000 mg | Freq: Once | INTRAMUSCULAR | Status: AC
  Administered 2015-08-08: 1 mg via INTRAVENOUS
  Filled 2015-08-08: qty 1

## 2015-08-08 NOTE — Progress Notes (Signed)
PCCM evaluation note:   S: Called by floor team for pt w/ severe EtOH w/d and agitation, eval for ICU transfer.  O: Examined patient at approx 11:40 PM after having received several doses of ativan, most notably 3 mg @ 11:24 PM. Found to be somnolent, but arousal to voice. Able to give strong cough and follow commands. Was able to help aid reposition patient to bed. Sitter arrived while I was examining patient. Review of MAR shows several PRN doses, but no standing orders for ativan. HR currently in the 90s, had previously been in the 130s-140s. Normotensive.  A/P: - Severe EtOH w/d. Hx of neurovascular lesion makes patient high risk if HTN due to w/d uncontrolled. - Appears to have responded well to higher dose of BZD. Recommend starting standing dose of ativan, 2mg  q3 hrs with additional, CIWA-triggered doses available. - Recommend keeping patient in stepdown for now with sitter and standing bzd. If becomes too sedated or too agitated will gladly accept to the ICU.  Luz Brazen, MD Pulmonary & Critical Care Medicine August 09, 2015, 12:05 AM

## 2015-08-08 NOTE — Progress Notes (Signed)
Patient ID: Stephen Carroll, male   DOB: 08-07-1966, 49 y.o.   MRN: [id number]   Subjective: Stephen Carroll was tremulous and diaphoretic when I saw him this morning. He said his tremors were bothering him but otherwise he felt his normal self; specifically, he denied nausea, hallucinations, feeling like bugs are crawling on his skin, or anxiety. He just finished he breakfast and got a shower before I came in. The social worker hadn't seen him yet but he's interested in stopping drinking so I reassured he or she would see him before he left, perhaps sometime tomorrow.  I was paged around noon that he was very agitated and wanted to leave against medical advice [place]. I explained to him how the Ativan works on the brain like alcohol so we can help control his agitation while he's here to get him through the withdrawal phase, and we can also watch him in case he starts having seizures. I explained how his blood pressure is high and I'm worried about his brain aneurysm. He understood what I was saying. He was very resistant at first but eventually agreed to stay the night.  Objective: Vital signs in last 24 hours: Filed Vitals:   08/08/15 0015 08/08/15 0105 08/08/15 0608 08/08/15 1000  BP: 157/109 143/85 155/93 178/97  Pulse: 124 107 95 67  Temp:   99.1 F (37.3 C) 98.4 F (36.9 C)  TempSrc:   Oral Oral  Resp:   18 19  Height:      Weight:   68.629 kg (151 lb 4.8 oz)   SpO2:   100% 100%    General: sitting on the edge of the bed, tremulous HEENT: no scleral icterus Cardiac: tachycardic, no rubs, murmurs or gallops Pulm: clear to auscultation bilaterally, moving normal volumes of air Abd: soft, nontender, nondistended, BS present Ext: warm and well perfused, no pedal edema  Lab Results: Basic Metabolic Panel:  Recent Labs Lab 08/07/15 0457 08/08/15 0610  NA 131* 128*  K 3.2* 2.8*  CL 91* 89*  CO2 23 26  GLUCOSE 158* 121*  BUN <5* <5*  CREATININE 0.66  0.68  CALCIUM 9.5 9.4  MG 1.9  --   PHOS 3.1  --    Medications: I have reviewed the patient's current medications. Scheduled Meds: . enoxaparin (LOVENOX) injection  40 mg Subcutaneous Q24H  . folic acid  1 mg Oral Daily  . multivitamin with minerals  1 tablet Oral Daily  . potassium chloride  40 mEq Oral BID  . sodium chloride  3 mL Intravenous Q12H  . sodium chloride  3 mL Intravenous Q12H  . thiamine  100 mg Oral Daily   Continuous Infusions: . sodium chloride     PRN Meds:.hydrALAZINE, LORazepam **OR** LORazepam   Assessment/Plan:  Alcohol withdrawal: On CIWA, getting ativan about every 6 hours for tremor, anxiety, and diaphoresis. Last drink on 8/26 at 1400 (2 days ago). No hallucinations, tactile sensations, or overt seizures. Social work will see him today or tomorrow. -CIWA -Social work to see  Hyponatremia: Likely from beer potomania and dehydration. We'll start normal saline at 100cc/hr and check BMPs every 12 hours. -NS 100cc/hr -BMP q12hr  Right thrombosed MCA aneurysm: Non-focal on exam during entirety of his stay. Controlling blood pressures with PRN hydralazine order -Continue 5mg  hydralazine PRN BP>170/110  Dispo: Disposition is deferred at this time, awaiting improvement of current medical problems.   The patient does not have a current PCP (  No Pcp Per Patient) and does need an [facility] hospital follow-up appointment after discharge.  The patient does not know have transportation limitations that hinder transportation to clinic appointments.  .Services Needed at time of discharge: Y = Yes, Blank = No PT:   OT:   RN:   Equipment:   Other:     LOS: 1 day   Loleta Chance, MD 08/08/2015, 12:02 PM

## 2015-08-08 NOTE — Social Work (Signed)
CSW met with patient and his wife. Patient was minimally responsive and had difficulty responding to questions about substance use and treatment options. Patient's was able to identify that he was interested in outpatient treatment options. CSW provided list of Treatment options both outpatient and inpatient.  No further CSW needs at this time. CSW signing off.  Christene Lye MSW, [place]

## 2015-08-08 NOTE — Progress Notes (Signed)
IMTS coverage note  Called to bedside by nursing for patient agitation.  Nursing staff reported increased confusion and patient attempting to leave hospital.  I came to the bedside where I found Stephen Carroll disoriented, reporting that we were at his mothers house and was nonsensical about his plans as far as where he was going.  He is agitated, mildly tremulous on exam.    He is going through alcohol withdraw and does not have capacity to make the decision to leave the hospital.  I have discussed this with his girlfriend who is at the bedside and she agrees.  I have ordered an IV Ativan 2mg  dose now and will redose as necessary to gain control of his agitated state.  I am transferring him to stepdown as he will need a more frequent monitoring and drug administration.  He currently is redirectable but I have informed security that he is a danger to himself and cannot leave the hospital, if he attempts to leave or refuse treatment I will fill out IVC paperwork.  Lucious Groves, DO 6:26 PM

## 2015-08-09 DIAGNOSIS — F1010 Alcohol abuse, uncomplicated: Secondary | ICD-10-CM

## 2015-08-09 DIAGNOSIS — E876 Hypokalemia: Secondary | ICD-10-CM

## 2015-08-09 DIAGNOSIS — I671 Cerebral aneurysm, nonruptured: Secondary | ICD-10-CM

## 2015-08-09 DIAGNOSIS — F10230 Alcohol dependence with withdrawal, uncomplicated: Secondary | ICD-10-CM

## 2015-08-09 DIAGNOSIS — E872 Acidosis: Secondary | ICD-10-CM

## 2015-08-09 HISTORY — DX: Cerebral aneurysm, nonruptured: I67.1

## 2015-08-09 LAB — BASIC METABOLIC PANEL
ANION GAP: 7 (ref 5–15)
ANION GAP: 9 (ref 5–15)
Anion gap: 11 (ref 5–15)
BUN: <5 mg/dL — ABNORMAL LOW (ref 6–20)
CALCIUM: 9.5 mg/dL (ref 8.9–10.3)
CHLORIDE: 96 mmol/L — AB (ref 101–111)
CHLORIDE: 99 mmol/L — AB (ref 101–111)
CO2: 23 mmol/L (ref 22–32)
CO2: 27 mmol/L (ref 22–32)
CO2: 25 mmol/L (ref 22–32)
CREATININE: 0.63 mg/dL (ref 0.61–1.24)
CREATININE: 0.69 mg/dL (ref 0.61–1.24)
Calcium: 9.5 mg/dL (ref 8.9–10.3)
Calcium: 9.3 mg/dL (ref 8.9–10.3)
Chloride: 99 mmol/L — ABNORMAL LOW (ref 101–111)
Creatinine, Ser: 0.67 mg/dL (ref 0.61–1.24)
GFR calc Af Amer: >60 mL/min (ref >60)
GFR calc Af Amer: >60 mL/min (ref >60)
GFR calc Af Amer: >60 mL/min (ref >60)
GFR calc non Af Amer: >60 mL/min (ref >60)
GFR calc non Af Amer: >60 mL/min (ref >60)
GLUCOSE: 101 mg/dL — AB (ref 65–99)
GLUCOSE: 105 mg/dL — AB (ref 65–99)
Glucose, Bld: 122 mg/dL — ABNORMAL HIGH (ref 65–99)
POTASSIUM: 3.1 mmol/L — AB (ref 3.5–5.1)
POTASSIUM: 3.4 mmol/L — AB (ref 3.5–5.1)
Potassium: 3.1 mmol/L — ABNORMAL LOW (ref 3.5–5.1)
Sodium: 130 mmol/L — ABNORMAL LOW (ref 135–145)
Sodium: 133 mmol/L — ABNORMAL LOW (ref 135–145)
Sodium: 133 mmol/L — ABNORMAL LOW (ref 135–145)

## 2015-08-09 LAB — MAGNESIUM: MAGNESIUM: 2.1 mg/dL (ref 1.7–2.4)

## 2015-08-09 LAB — CK: Total CK: 1596 U/L — ABNORMAL HIGH (ref 49–397)

## 2015-08-09 LAB — OSMOLALITY: Osmolality: 279 mOsm/kg (ref 275–300)

## 2015-08-09 MED ORDER — LORAZEPAM 2 MG/ML IJ SOLN: 2.0000 mg | INTRAMUSCULAR | Status: AC

## 2015-08-09 MED ORDER — LORAZEPAM 2 MG/ML IJ SOLN
2.0000 mg | INTRAMUSCULAR | Status: AC
  Administered 2015-08-09 (×2): 2 mg via INTRAVENOUS
  Filled 2015-08-09 (×2): qty 1

## 2015-08-09 MED ORDER — CLONIDINE HCL 0.1 MG PO TABS
0.1000 mg | ORAL_TABLET | Freq: Three times a day (TID) | ORAL | Status: DC
  Administered 2015-08-09 (×2): 0.1 mg via ORAL
  Filled 2015-08-09 (×2): qty 1

## 2015-08-09 MED ORDER — POTASSIUM CHLORIDE IN NACL 20-0.9 MEQ/L-% IV SOLN
INTRAVENOUS | Status: AC
  Administered 2015-08-09: 22:00:00 via INTRAVENOUS
  Filled 2015-08-09: qty 1000

## 2015-08-09 MED ORDER — SODIUM CHLORIDE 0.9 % IV SOLN
INTRAVENOUS | Status: DC
  Administered 2015-08-10: 06:00:00 via INTRAVENOUS

## 2015-08-09 MED ORDER — LORAZEPAM 2 MG/ML IJ SOLN
2.0000 mg | INTRAMUSCULAR | Status: DC
  Administered 2015-08-09 (×2): 2 mg via INTRAVENOUS
  Filled 2015-08-09 (×2): qty 1

## 2015-08-09 MED ORDER — LABETALOL HCL 5 MG/ML IV SOLN
5.0000 mg | INTRAVENOUS | Status: DC | PRN
  Administered 2015-08-09: 5 mg via INTRAVENOUS
  Filled 2015-08-09: qty 4

## 2015-08-09 MED ORDER — POTASSIUM CHLORIDE 10 MEQ/100ML IV SOLN
10.0000 meq | INTRAVENOUS | Status: AC
  Administered 2015-08-09 (×2): 10 meq via INTRAVENOUS
  Filled 2015-08-09 (×2): qty 100

## 2015-08-09 MED ORDER — LORAZEPAM 2 MG/ML IJ SOLN: 2.0000 mg | INTRAMUSCULAR | Status: DC

## 2015-08-09 MED ORDER — POTASSIUM CHLORIDE 10 MEQ/100ML IV SOLN
10.0000 meq | INTRAVENOUS | Status: AC
  Administered 2015-08-09 (×3): 10 meq via INTRAVENOUS
  Filled 2015-08-09 (×4): qty 100

## 2015-08-09 MED ORDER — SODIUM CHLORIDE 0.9 % IV SOLN: INTRAVENOUS | Status: DC

## 2015-08-09 NOTE — Consult Note (Signed)
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  Developed HTN/ tachy. His last drink of alcohol was 2 days ago, and he usually drinks 3-4 beers per day per his report. Admit to floor. Then moved SDU 8/28 for worsening ETOH WD. Ciwa protocol started. Ativan received scheduled, called to assess need precedex, icu .  PAST MEDICAL HISTORY :  MCA clot aneurysm ETOH  No Known Allergies  FAMILY HISTORY:  family history is not on file. not for etoh, difficult to obtain this with enceph SOCIAL HISTORY:  reports that he has been smoking Cigarettes.  He has been smoking about 0.00 packs per day. He does not have any smokeless tobacco history on file. He reports that he drinks alcohol. He reports that he uses illicit drugs (Marijuana).  REVIEW OF SYSTEMS:   unable to perform with encephalopthy  SUBJECTIVE:   VITAL SIGNS: Temp:  [97.9 F (36.6 C)-98.7 F (37.1 C)] 98.7 F (37.1 C) (08/29 0400) Pulse Rate:  [87-157] 127 (08/29 1000) Resp:  [13-24] 20 (08/29 0900) BP: (120-221)/(73-137) 148/92 mmHg (08/29 0900) SpO2:  [100 %] 100 % (08/29 0900) Weight:  [68.947 kg (152 lb)] 68.947 kg (152 lb) (08/29 0405)  PHYSICAL EXAMINATION: General:  Awake, not fx, no distress Neuro:  Awakens, O x 1 probably, nonfocal, agitation, moving upper ext erratic  HEENT:  jvd wnl Cardiovascular:  s1 s2 RRR not tachy Lungs: CTA Abdomen:  BS wnl, no r/g Musculoskeletal:  No edema Skin:  No rash   Recent Labs Lab 08/08/15 0610 08/08/15 2039  08/09/15 0232  NA 128* 128* 130*  K 2.8* 3.2* 3.1*  CL 89* 92* 96*  CO2 26 23 23   BUN <5* <5* <5*  CREATININE 0.68 0.80 0.63  GLUCOSE 121* 126* 122*    Recent Labs Lab 08/07/15 0457 08/08/15 1410  HGB 11.9* 11.9*  HCT 33.9* 34.7*  WBC 5.0 6.4  PLT 132* 117*   Dg Chest 2 View  08/07/2015   CLINICAL DATA:  49 year old male with history of an alcohol withdrawal seizure.  EXAM: CHEST  2 VIEW  COMPARISON:  Chest x-ray 06/15/2014.  FINDINGS: Lung volumes are normal. No consolidative airspace disease. No pleural effusions. No pneumothorax. No pulmonary nodule or mass noted. Pulmonary vasculature and the cardiomediastinal silhouette are within normal limits. 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  IMPRESSION: 1. No radiographic evidence of acute cardiopulmonary disease.   Electronically Signed   By: Stephen Carroll M.D.   On: 08/07/2015 15:12    ASSESSMENT / PLAN:  Etoh WD Severe agitation HTN in setting known MCA thrombosed aneurysm  PLAN: Add clonidine in setting hTN and ETOH WD He received total 11 mg ativan last 24 hr He is eating, has intermittent agitation Continued thiamine, folic, MVI Can remain in SDU for now Current HR 97, sys 160, not diaphoretic May in fact get worse and need precedex and ICU Will follow daily  Requires continued  scheduled benzo May consider addition haldol with qtc assessment May need swallow evaluation with his current encephalaopthy  Stephen Carroll. Stephen Mould, MD, FACP Pgr: [place] [facility]  Pulmonary and [place] Pager: [phone]  08/09/2015, 10:59 AM

## 2015-08-09 NOTE — Progress Notes (Signed)
Patient ID: Stephen Carroll, male   DOB: 1966-01-10, 49 y.o.   MRN: [id number]   Subjective: Stephen Carroll had a rough time with his withdrawals last night. Per his girlfriend, he was hallucinating, shaking, and very agitated. When I saw him this morning, he was calm, normotensive, arousable, protecting his airway, but completely disoriented.  Objective: Vital signs in last 24 hours: Filed Vitals:   08/09/15 0400 08/09/15 0405 08/09/15 0500 08/09/15 0519  BP: 166/103  221/137 221/137  Pulse: 115     Temp: 98.7 F (37.1 C)     TempSrc: Oral     Resp: 19  21   Height:      Weight:  68.947 kg (152 lb)    SpO2: 100%      General: resting in bed, muscles rigid, tremulous Cardiac: tachycardic, no rubs, murmurs or gallops Pulm: clear to auscultation bilaterally, moving normal volumes of air Abd: soft, nontender, nondistended, BS present Ext: warm and well perfused, no pedal edema Neuro: alert and oriented times zero, cranial nerves II-XII grossly intact   Lab Results: Basic Metabolic Panel:  Recent Labs Lab 08/07/15 0457  08/08/15 2039 08/09/15 0232  NA 131*  < > 128* 130*  K 3.2*  < > 3.2* 3.1*  CL 91*  < > 92* 96*  CO2 23  < > 23 23  GLUCOSE 158*  < > 126* 122*  BUN <5*  < > <5* <5*  CREATININE 0.66  < > 0.80 0.63  CALCIUM 9.5  < > 9.5 9.5  MG 1.9  --  1.9  --   PHOS 3.1  --   --   --   < > = values in this interval not displayed.    Recent Labs Lab 08/07/15 0457 08/08/15 1410  WBC 5.0 6.4  HGB 11.9* 11.9*  HCT 33.9* 34.7*  MCV 78.7 81.1  PLT 132* 117*   Medications: I have reviewed the patient's current medications. Scheduled Meds: . enoxaparin (LOVENOX) injection  40 mg Subcutaneous Q24H  . Influenza vac split quadrivalent PF  0.5 mL Intramuscular Tomorrow-1000  . LORazepam  2 mg Intravenous Q3H  . sodium chloride  3 mL Intravenous Q12H  . sodium chloride  3 mL Intravenous Q12H  . thiamine  100 mg Oral Daily   Continuous Infusions: . sodium chloride 100  mL/hr at 08/09/15 0434   PRN Meds:.hydrALAZINE, LORazepam   Assessment/Plan:  Alcohol withdrawal: He was much worse overnight given he was in the 48 hour time window. PCCM evaluated him and recommending holding the course. He received 18mg  IV Ativan and is now getting 2mg  scheduled every 3 hours on top of CIWA scoring PRN orders. We'll continue with this plan today and readdress his need this afternoon; we may be able to stop the standing 2mg  q3hr if his CIWAs improve. He was protecting his airway and was satting 100% on room air when I saw him. He was also quite rigid when I saw him so I put in for a CK this afternoon; we may need to increase his fluids depending on the level. -Continue Ativan 2mg  IV every 3 hours -Continue CIWA scoring, ativan 2-3mg  -Follow-up CK this afternoon; may need to increase fluid rate if he has rhabdo  Hyponatremia and hypokalemia: Likely from dehydration and beer potomania; he is correcting with fluids appropriately. -BMP at noon -BMP in the morning -Replete as needed  Hypertension: We need to keep it controlled because of his MCA aneurysm. It seems to be  doing well when he's calm and hyralazine PRN is keeping him low when he's agitated. -Change IV hydralazing to IV labetalol 5mg  for BP >170/100  Dispo: Disposition is deferred at this time, awaiting improvement of current medical problems.   The patient does not have a current PCP (No Pcp Per Patient) and does need an [facility] hospital follow-up appointment after discharge.  The patient does have transportation limitations that hinder transportation to clinic appointments.  .Services Needed at time of discharge: Y = Yes, Blank = No PT:   OT:   RN:   Equipment:   Other:     LOS: 2 days   Loleta Chance, MD 08/09/2015, 8:26 AM

## 2015-08-09 NOTE — Progress Notes (Signed)
pts systolic BP is in the low 200's.  Not sure how accurate it is due to pts status of agitation, movement, tremors, and muscle tension.  5mg  of PRN Hydralazine given at 0520. RN will continue to monitor.

## 2015-08-09 NOTE — Progress Notes (Signed)
Utilization Review Completed.[name] T8/29/2016

## 2015-08-09 NOTE — Progress Notes (Signed)
08/09/2015 influenza vaccine given in left deltoid at 1221. Lot #[id number] and expire May 24, 2016. Rico Sheehan RN

## 2015-08-09 NOTE — Progress Notes (Signed)
Patient seen multiple times overnight on account of severe agitation and alcohol withdrawal. CIWA as high as 31 at one point in the evening. Patient very tremulous overnight, seemed to be actively hallucinating, requiring high and frequent doses of IV Ativan. BP and HR also elevated periodically 2/2 agitation. Discussed w/ PCCM, please see separate note. Patient most recently seen at 6:00 AM, had just received Ativan 2 mg + Hydralazine for SBP in the 200's. Now HR in the 80's, SBP 160's, SpO2 100% on RA. Patient resting comfortably, in no acute distress. OF NOTE: Patient has received 18 mg IV Ativan since 5 PM yesterday. He does seem to respond quite well to benzos, although short lived.  -Will continue to monitor closely in stepdown -Appreciate PCCM recommendations; will formally consult for ICU transfer/Precedex gtt if patient continues to be severely agitated or sedated 2/2 benzos.  -Continue Ativan scheduled; 2 mg IV q3h (holding parameters in place for bradycardia, decreased respiratory effort, or hypotension) -PRN Ativan; 2-3 mg q1h prn based on CIWA score.  -Continue to closely monitor BP given h/o MCA aneurysm; Hydralazine 5 mg IV q4h prn for SBP > 170, DBP >100.   Natasha Bence, MD PGY-3, Internal Medicine Pager: [phone]

## 2015-08-10 DIAGNOSIS — I671 Cerebral aneurysm, nonruptured: Secondary | ICD-10-CM

## 2015-08-10 DIAGNOSIS — F10239 Alcohol dependence with withdrawal, unspecified: Principal | ICD-10-CM

## 2015-08-10 DIAGNOSIS — F10939 Alcohol use, unspecified with withdrawal, unspecified: Secondary | ICD-10-CM | POA: Insufficient documentation

## 2015-08-10 DIAGNOSIS — R569 Unspecified convulsions: Secondary | ICD-10-CM

## 2015-08-10 LAB — BASIC METABOLIC PANEL
Anion gap: 8 (ref 5–15)
Anion gap: 7 (ref 5–15)
Anion gap: 7 (ref 5–15)
CALCIUM: 9.1 mg/dL (ref 8.9–10.3)
CALCIUM: 8.8 mg/dL — AB (ref 8.9–10.3)
CALCIUM: 9.1 mg/dL (ref 8.9–10.3)
CHLORIDE: 97 mmol/L — AB (ref 101–111)
CO2: 24 mmol/L (ref 22–32)
CO2: 25 mmol/L (ref 22–32)
CO2: 26 mmol/L (ref 22–32)
CREATININE: 0.75 mg/dL (ref 0.61–1.24)
CREATININE: 0.68 mg/dL (ref 0.61–1.24)
CREATININE: 0.74 mg/dL (ref 0.61–1.24)
Chloride: 97 mmol/L — ABNORMAL LOW (ref 101–111)
Chloride: 101 mmol/L (ref 101–111)
GFR calc Af Amer: >60 mL/min (ref >60)
GLUCOSE: 108 mg/dL — AB (ref 65–99)
Glucose, Bld: 111 mg/dL — ABNORMAL HIGH (ref 65–99)
Glucose, Bld: 107 mg/dL — ABNORMAL HIGH (ref 65–99)
POTASSIUM: 3.4 mmol/L — AB (ref 3.5–5.1)
Potassium: 3.3 mmol/L — ABNORMAL LOW (ref 3.5–5.1)
Potassium: 3.3 mmol/L — ABNORMAL LOW (ref 3.5–5.1)
SODIUM: 129 mmol/L — AB (ref 135–145)
SODIUM: 134 mmol/L — AB (ref 135–145)
Sodium: 129 mmol/L — ABNORMAL LOW (ref 135–145)

## 2015-08-10 LAB — OSMOLALITY, URINE: Osmolality, Ur: 299 mOsm/kg — ABNORMAL LOW (ref 390–1090)

## 2015-08-10 LAB — MAGNESIUM: MAGNESIUM: 1.9 mg/dL (ref 1.7–2.4)

## 2015-08-10 LAB — SODIUM, URINE, RANDOM: SODIUM UR: 29 mmol/L

## 2015-08-10 MED ORDER — POTASSIUM CHLORIDE CRYS ER 20 MEQ PO TBCR
20.0000 meq | EXTENDED_RELEASE_TABLET | Freq: Two times a day (BID) | ORAL | Status: DC
  Administered 2015-08-10: 20 meq via ORAL
  Filled 2015-08-10: qty 1

## 2015-08-10 MED ORDER — SODIUM CHLORIDE 0.9 % IV SOLN: INTRAVENOUS | Status: DC

## 2015-08-10 NOTE — Consult Note (Signed)
   Name: Stephen Carroll MRN: [id number] DOB: 04-13-1966    ADMISSION DATE:  08/07/2015 CONSULTATION DATE:  08/09/15  REFERRING MD :  Dr Luanne Bras  CHIEF COMPLAINT:  ETOH WD  BRIEF PATIENT DESCRIPTION: 49 yr old ETOH WD  SIGNIFICANT EVENTS  8/27- admission n/v , developed HTN / tachy  STUDIES:    SUBJECTIVE: MAJOR improved, alert, calm  VITAL SIGNS: Temp:  [98.1 F (36.7 C)-98.9 F (37.2 C)] 98.9 F (37.2 C) (08/30 0800) Pulse Rate:  [57-127] 67 (08/30 0600) Resp:  [10-21] 17 (08/30 0600) BP: (120-172)/(77-113) 154/96 mmHg (08/30 0600) SpO2:  [99 %-100 %] 100 % (08/30 0600) Weight:  [70.761 kg (156 lb)] 70.761 kg (156 lb) (08/30 0500)  PHYSICAL EXAMINATION: General:  Awake, not fx, no distress Neuro:  Awakens, O x 2.5 , nonfocal, agitation resolved, knows who president is, hungry  HEENT:  jvd wnl Cardiovascular:  s1 s2 RRR not tachy Lungs: CTA Abdomen:  BS wnl, no r/g Musculoskeletal:  No edema Skin:  No rash   Recent Labs Lab 08/09/15 1844 08/10/15 0105 08/10/15 0223  NA 133* 129* 129*  K 3.4* 3.3* 3.3*  CL 99* 97* 97*  CO2 25 24 25   BUN <5* <5* <5*  CREATININE 0.67 0.75 0.68  GLUCOSE 105* 111* 107*    Recent Labs Lab 08/07/15 0457 08/08/15 1410  HGB 11.9* 11.9*  HCT 33.9* 34.7*  WBC 5.0 6.4  PLT 132* 117*   No results found.  ASSESSMENT / PLAN:  Etoh WD Severe agitation HTN in setting known MCA thrombosed aneurysm  PLAN: MAJOR improved this am  No role precedex Slow taper benzo Remains HTN , consider increase clonidine or other agents per primary Follow lytes Move to floor Thiamine, folic, MVI i started clonidine as he has ETOH WD, but given his MCA aneurysm this is likely NOT a good outpt agent  Will sign off, call if needed  Lavon Paganini. Titus Mould, MD, FACP Pgr: [place] [facility]  Pulmonary and [place] Pager: [phone]  08/10/2015, 9:07 AM
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
## 2015-08-10 NOTE — Discharge Instructions (Signed)
Alcohol Withdrawal °Anytime drug use is interfering with normal living activities it has become abuse. This includes problems with family and friends. Psychological dependence has developed when your mind tells you that the drug is needed. This is usually followed by physical dependence when a continuing increase of drugs are required to get the same feeling or "high." This is known as addiction or chemical dependency. A person's risk is much higher if there is a history of chemical dependency in the family. °Mild Withdrawal Following Stopping Alcohol, When Addiction or Chemical Dependency Has Developed °When a person has developed tolerance to alcohol, any sudden stopping of alcohol can cause uncomfortable physical symptoms. Most of the time these are mild and consist of tremors in the hands and increases in heart rate, breathing, and temperature. Sometimes these symptoms are associated with anxiety, panic attacks, and bad dreams. There may also be stomach upset. Normal sleep patterns are often interrupted with periods of inability to sleep (insomnia). This may last for 6 months. Because of this discomfort, many people choose to continue drinking to get rid of this discomfort and to try to feel normal. °Severe Withdrawal with Decreased or No Alcohol Intake, When Addiction or Chemical Dependency Has Developed °About five percent of alcoholics will develop signs of severe withdrawal when they stop using alcohol. One sign of this is development of generalized seizures (convulsions). Other signs of this are severe agitation and confusion. This may be associated with believing in things which are not real or seeing things which are not really there (delusions and hallucinations). Vitamin deficiencies are usually present if alcohol intake has been long-term. Treatment for this most often requires hospitalization and close observation. °Addiction can only be helped by stopping use of all chemicals. This is hard but may  save your life. With continual alcohol use, possible outcomes are usually loss of self respect and esteem, violence, and death. °Addiction cannot be cured but it can be stopped. This often requires outside help and the care of professionals. Treatment centers are listed in the yellow pages under Cocaine, Narcotics, and Alcoholics Anonymous. Most hospitals and clinics can refer you to a specialized care center. °It is not necessary for you to go through the uncomfortable symptoms of withdrawal. Your caregiver can provide you with medicines that will help you through this difficult period. Try to avoid situations, friends, or drugs that made it possible for you to keep using alcohol in the past. Learn how to say no. °It takes a long period of time to overcome addictions to all drugs, including alcohol. There may be many times when you feel as though you want a drink. After getting rid of the physical addiction and withdrawal, you will have a lessening of the craving which tells you that you need alcohol to feel normal. Call your caregiver if more support is needed. Learn who to talk to in your family and among your friends so that during these periods you can receive outside help. Alcoholics Anonymous (AA) has helped many people over the years. To get further help, contact AA or call your caregiver, counselor, or clergyperson. Al-Anon and Alateen are support groups for friends and family members of an alcoholic. The people who love and care for an alcoholic often need help, too. For information about these organizations, check your phone directory or call a local alcoholism treatment center.  °SEEK IMMEDIATE MEDICAL CARE IF:  °· You have a seizure. °· You have a fever. °· You experience uncontrolled vomiting or you   vomit up blood. This may be bright red or look like black coffee grounds. °· You have blood in the stool. This may be bright red or appear as a black, tarry, bad-smelling stool. °· You become lightheaded or  faint. Do not drive if you feel this way. Have someone else drive you or call 911 for help. °· You become more agitated or confused. °· You develop uncontrolled anxiety. °· You begin to see things that are not really there (hallucinate). °Your caregiver has determined that you completely understand your medical condition, and that your mental state is back to normal. You understand that you have been treated for alcohol withdrawal, have agreed not to drink any alcohol for a minimum of 1 day, will not operate a car or other machinery for 24 hours, and have had an opportunity to ask any questions about your condition. °Document Released: 09/06/2005 Document Revised: 02/19/2012 Document Reviewed: 07/15/2008 °ExitCare® Patient Information ©2015 [employer]. This information is not intended to replace advice given to you by your health care provider. Make sure you discuss any questions you have with your health care provider. ° °Alcohol Use Disorder °Alcohol use disorder is a mental disorder. It is not a one-time incident of heavy drinking. Alcohol use disorder is the excessive and uncontrollable use of alcohol over time that leads to problems with functioning in one or more areas of daily living. People with this disorder risk harming themselves and others when they drink to excess. Alcohol use disorder also can cause other mental disorders, such as mood and anxiety disorders, and serious physical problems. People with alcohol use disorder often misuse other drugs.  °Alcohol use disorder is common and widespread. Some people with this disorder drink alcohol to cope with or escape from negative life events. Others drink to relieve chronic pain or symptoms of mental illness. People with a family history of alcohol use disorder are at higher risk of losing control and using alcohol to excess.  °SYMPTOMS  °Signs and symptoms of alcohol use disorder may include the following:  °· Consumption of alcohol in larger amounts or  over a longer period of time than intended. °· Multiple unsuccessful attempts to cut down or control alcohol use.   °· A great deal of time spent obtaining alcohol, using alcohol, or recovering from the effects of alcohol (hangover). °· A strong desire or urge to use alcohol (cravings).   °· Continued use of alcohol despite problems at work, school, or home because of alcohol use.   °· Continued use of alcohol despite problems in relationships because of alcohol use. °· Continued use of alcohol in situations when it is physically hazardous, such as driving a car. °· Continued use of alcohol despite awareness of a physical or psychological problem that is likely related to alcohol use. Physical problems related to alcohol use can involve the brain, heart, liver, stomach, and intestines. Psychological problems related to alcohol use include intoxication, depression, anxiety, psychosis, delirium, and dementia.   °· The need for increased amounts of alcohol to achieve the same desired effect, or a decreased effect from the consumption of the same amount of alcohol (tolerance). °· Withdrawal symptoms upon reducing or stopping alcohol use, or alcohol use to reduce or avoid withdrawal symptoms. Withdrawal symptoms include: °¨ Racing heart. °¨ Hand tremor. °¨ Difficulty sleeping. °¨ Nausea. °¨ Vomiting. °¨ Hallucinations. °¨ Restlessness. °¨ Seizures. °DIAGNOSIS °Alcohol use disorder is diagnosed through an assessment by your health care provider. Your health care provider may start by asking three or four questions   to screen for excessive or problematic alcohol use. To confirm a diagnosis of alcohol use disorder, at least two symptoms must be present within a 12-month period. The severity of alcohol use disorder depends on the number of symptoms: °· Mild--two or three. °· Moderate--four or five. °· Severe--six or more. °Your health care provider may perform a physical exam or use results from lab tests to see if you have  physical problems resulting from alcohol use. Your health care provider may refer you to a mental health professional for evaluation. °TREATMENT  °Some people with alcohol use disorder are able to reduce their alcohol use to low-risk levels. Some people with alcohol use disorder need to quit drinking alcohol. When necessary, mental health professionals with specialized training in substance use treatment can help. Your health care provider can help you decide how severe your alcohol use disorder is and what type of treatment you need. The following forms of treatment are available:  °· Detoxification. Detoxification involves the use of prescription medicines to prevent alcohol withdrawal symptoms in the first week after quitting. This is important for people with a history of symptoms of withdrawal and for heavy drinkers who are likely to have withdrawal symptoms. Alcohol withdrawal can be dangerous and, in severe cases, cause death. Detoxification is usually provided in a hospital or in-patient substance use treatment facility. °· Counseling or talk therapy. Talk therapy is provided by substance use treatment counselors. It addresses the reasons people use alcohol and ways to keep them from drinking again. The goals of talk therapy are to help people with alcohol use disorder find healthy activities and ways to cope with life stress, to identify and avoid triggers for alcohol use, and to handle cravings, which can cause relapse. °· Medicines. Different medicines can help treat alcohol use disorder through the following actions: °¨ Decrease alcohol cravings. °¨ Decrease the positive reward response felt from alcohol use. °¨ Produce an uncomfortable physical reaction when alcohol is used (aversion therapy). °· Support groups. Support groups are run by people who have quit drinking. They provide emotional support, advice, and guidance. °These forms of treatment are often combined. Some people with alcohol use disorder  benefit from intensive combination treatment provided by specialized substance use treatment centers. Both inpatient and outpatient treatment programs are available. °Document Released: 01/04/2005 Document Revised: 04/13/2014 Document Reviewed: 03/06/2013 °ExitCare® Patient Information ©2015 [employer]. This information is not intended to replace advice given to you by your health care provider. Make sure you discuss any questions you have with your health care provider. ° °

## 2015-08-10 NOTE — Progress Notes (Signed)
CSW informed pt lost list of resources- new list provided at bedside.  CSW signing off.  Domenica Reamer, [place] Social Worker [phone]

## 2015-08-10 NOTE — Progress Notes (Signed)
Patient ID: Stephen Carroll, male   DOB: 06-20-1966, 49 y.o.   MRN: [id number]   Subjective: Stephen Carroll looked remarkably better this morning. He has no complaints, specifically he denies headache, tremor, hallucinations, tremor, muscle aches, or chest pain. His CIWA scores were 5, 5, 3, and 4 since midnight and he didn't receive any Ativan since 11pm. He says he's ready to go home and he will call the rehab outpatient center and schedule an appointment with his PCP whenever he leaves.  Objective: Vital signs in last 24 hours: Filed Vitals:   08/10/15 0400 08/10/15 0500 08/10/15 0600 08/10/15 0800  BP: 134/78  154/96   Pulse: 57  67   Temp: 98.3 F (36.8 C)   98.9 F (37.2 C)  TempSrc: Axillary   Oral  Resp: 10  17   Height:      Weight:  70.761 kg (156 lb)    SpO2: 100%  100%    General: resting in bed, without tremors or muscle rigidity Cardiac: slightly tachycardic, no rubs, murmurs or gallops Pulm: clear to auscultation bilaterally, moving normal volumes of air Abd: soft, nontender, nondistended, BS present Ext: warm and well perfused, no pedal edema Neuro: alert and oriented times three, cranial nerves II-XII grossly intact, no asterixis  Lab Results: Basic Metabolic Panel:  Recent Labs Lab 08/07/15 0457  08/09/15 1205  08/10/15 0105 08/10/15 0223 08/10/15 1043  NA 131*  < > 133*  < > 129* 129* 134*  K 3.2*  < > 3.1*  < > 3.3* 3.3* 3.4*  CL 91*  < > 99*  < > 97* 97* 101  CO2 23  < > 27  < > 24 25 26   GLUCOSE 158*  < > 101*  < > 111* 107* 108*  BUN <5*  < > <5*  < > <5* <5* <5*  CREATININE 0.66  < > 0.69  < > 0.75 0.68 0.74  CALCIUM 9.5  < > 9.5  < > 9.1 8.8* 9.1  MG 1.9  < > 2.1  --  1.9  --   --   PHOS 3.1  --   --   --   --   --   --   < > = values in this interval not displayed.   Cardiac Enzymes:  Recent Labs Lab 08/08/15 1410 08/09/15 1205  CKTOTAL  --  1596*  TROPONINI <0.03  --    Medications: I have reviewed the patient's current  medications. Scheduled Meds: . enoxaparin (LOVENOX) injection  40 mg Subcutaneous Q24H  . potassium chloride  20 mEq Oral BID  . sodium chloride  3 mL Intravenous Q12H  . thiamine  100 mg Oral Daily   Continuous Infusions: . sodium chloride 200 mL/hr (08/10/15 0920)   PRN Meds:.labetalol, LORazepam   Assessment/Plan:  Alcohol withdrawal: He turned a corner last night and hasn't required any Ativan since 10pm with CIWAs normalizing and consistently less than 8 since then. He was in high spirits today and glad he stayed. Social work saw him and got him the paperwork to follow-up with outpatient rehab. He said he's very interested in stopping now that he's gone through the withdrawal phase. -Continue CIWA scoring, ativan PRN -He has outpatient rehab paperwork and is very   Hyponatremia and hypokalemia: Likely from dehydration and beer potomania. We were correcting his hyponatremia slowly but I think our fluids were running too slow because his urine osmoles were high at 280. I increased the rate  of fluids to 200cc/hr and his sodium came up to 134. He also had some CK elevation to 1200 because of his tremors so I'm hoping the fluids will flush that out too. His creatinine remains stable and normal. He remains slightly hypokalemic which I'm repleting with PO potassium. -Continue fluids at 200cc/hr -Repleting hypokalemia with PO potassium  Hypertension: Resolved along with his withdrawal.  Dispo: Disposition is deferred at this time, awaiting improvement of current medical problems. Perhaps later today or tomorrow.  The patient does have a current PCP and does need an [facility] hospital follow-up appointment after discharge.  The patient does not know have transportation limitations that hinder transportation to clinic appointments.  .Services Needed at time of discharge: Y = Yes, Blank = No PT:   OT:   RN:   Equipment:   Other:     LOS: 3 days   Loleta Chance, MD 08/10/2015, 12:51 PM

## 2015-12-12 DIAGNOSIS — K7031 Alcoholic cirrhosis of liver with ascites: Secondary | ICD-10-CM

## 2015-12-12 HISTORY — DX: Alcoholic cirrhosis of liver with ascites: K70.31

## 2016-01-27 ENCOUNTER — Encounter (HOSPITAL_COMMUNITY): Payer: Self-pay | Admitting: Emergency Medicine

## 2016-01-27 ENCOUNTER — Emergency Department (HOSPITAL_COMMUNITY)
Admission: EM | Admit: 2016-01-27 | Discharge: 2016-01-27 | Disposition: A | Discharge status: Home / Self Care | Payer: Medicaid Other | Attending: Emergency Medicine | Admitting: Emergency Medicine

## 2016-01-27 DIAGNOSIS — R000 Tachycardia, unspecified: Secondary | ICD-10-CM | POA: Insufficient documentation

## 2016-01-27 DIAGNOSIS — R197 Diarrhea, unspecified: Secondary | ICD-10-CM | POA: Diagnosis not present

## 2016-01-27 DIAGNOSIS — R112 Nausea with vomiting, unspecified: Secondary | ICD-10-CM | POA: Diagnosis present

## 2016-01-27 DIAGNOSIS — Z8679 Personal history of other diseases of the circulatory system: Secondary | ICD-10-CM | POA: Diagnosis not present

## 2016-01-27 DIAGNOSIS — Z87891 Personal history of nicotine dependence: Secondary | ICD-10-CM | POA: Insufficient documentation

## 2016-01-27 HISTORY — DX: Migraine, unspecified, not intractable, without status migrainosus: G43.909

## 2016-01-27 LAB — BASIC METABOLIC PANEL
Anion gap: 15 (ref 5–15)
CALCIUM: 8.3 mg/dL — AB (ref 8.9–10.3)
CHLORIDE: 97 mmol/L — AB (ref 101–111)
CO2: 21 mmol/L — AB (ref 22–32)
CREATININE: 0.75 mg/dL (ref 0.61–1.24)
GFR calc non Af Amer: >60 mL/min (ref >60)
Glucose, Bld: 140 mg/dL — ABNORMAL HIGH (ref 65–99)
Potassium: 3.3 mmol/L — ABNORMAL LOW (ref 3.5–5.1)
Sodium: 133 mmol/L — ABNORMAL LOW (ref 135–145)

## 2016-01-27 LAB — CBC
HEMATOCRIT: 30.1 % — AB (ref 39.0–52.0)
HEMOGLOBIN: 10.3 g/dL — AB (ref 13.0–17.0)
MCH: 30.4 pg (ref 26.0–34.0)
MCHC: 34.2 g/dL (ref 30.0–36.0)
MCV: 88.8 fL (ref 78.0–100.0)
Platelets: 108 10*3/uL — ABNORMAL LOW (ref 150–400)
RBC: 3.39 MIL/uL — ABNORMAL LOW (ref 4.22–5.81)
RDW: 15.9 % — AB (ref 11.5–15.5)
WBC: 4.7 10*3/uL (ref 4.0–10.5)

## 2016-01-27 LAB — I-STAT TROPONIN, ED: Troponin i, poc: 0 ng/mL (ref 0.00–0.08)

## 2016-01-27 LAB — LIPASE, BLOOD: Lipase: 41 U/L (ref 11–51)

## 2016-01-27 MED ORDER — SODIUM CHLORIDE 0.9 % IV BOLUS (SEPSIS)
1000.0000 mL | Freq: Once | INTRAVENOUS | Status: AC
  Administered 2016-01-27: 1000 mL via INTRAVENOUS

## 2016-01-27 MED ORDER — ONDANSETRON HCL 4 MG/2ML IJ SOLN
4.0000 mg | Freq: Once | INTRAMUSCULAR | Status: AC
  Administered 2016-01-27: 4 mg via INTRAVENOUS
  Filled 2016-01-27: qty 2

## 2016-01-27 MED ORDER — ONDANSETRON 4 MG PO TBDP: 4.0000 mg | ORAL_TABLET | Freq: Three times a day (TID) | ORAL | Status: DC | PRN

## 2016-01-27 NOTE — ED Notes (Signed)
Patient here with persistent NVD. States onset over 1 week ago. HR 160 in triage. Patient denies pain and fever.

## 2016-01-27 NOTE — Discharge Instructions (Signed)
As discussed, your evaluation today has been largely reassuring.  But, it is important that you monitor your condition carefully, and do not hesitate to return to the ED if you develop new, or concerning changes in your condition. ? ?Otherwise, please follow-up with your physician for appropriate ongoing care. ? ?

## 2016-01-27 NOTE — ED Notes (Signed)
Pt is in stable condition upon d/c and ambulates from ED. 

## 2016-01-27 NOTE — ED Provider Notes (Signed)
CSN: [id number]     Arrival date & time 01/27/16  0542 History   First MD Initiated Contact with Patient 01/27/16 [phone]     Chief Complaint  Patient presents with  . Emesis  . Diarrhea     (Consider location/radiation/quality/duration/timing/severity/associated sxs/prior Treatment) HPI Patient presents with concern of nausea, vomiting, diarrhea. Onset was about one week ago, since onset patient has had persistent by mouth intolerance, both with postprandial vomiting and postprandial diarrhea. No sustained abdominal pain, and currently no pain at all. precipitant, though the patient does acknowledge multiple sick contacts. Patient does not smoke currently, drinks about 1 beer daily. Since onset, no clear alleviating or exacerbating factors beyond oral intake. No fever, chills. Patient does have history of multiple surgeries, though no abdominal surgery.   Past Medical History  Diagnosis Date  . Migraine    Past Surgical History  Procedure Laterality Date  . Back surgery      Pt. reports rods placed in back   History reviewed. No pertinent family history. Social History  Substance Use Topics  . Smoking status: Former Smoker -- 0.00 packs/day    Types: Cigarettes  . Smokeless tobacco: None  . Alcohol Use: Yes     Comment: occasional    Review of Systems  Constitutional:       Per HPI, otherwise negative  HENT:       Per HPI, otherwise negative  Respiratory:       Per HPI, otherwise negative  Cardiovascular:       Per HPI, otherwise negative  Gastrointestinal: Positive for nausea, vomiting and diarrhea. Negative for abdominal pain.  Endocrine:       Negative aside from HPI  Genitourinary:       Neg aside from HPI   Musculoskeletal:       Per HPI, otherwise negative  Skin: Negative.   Neurological: Negative for syncope.      Allergies  Review of patient's allergies indicates no known allergies.  Home Medications   Prior to Admission medications   Not on  File   BP 146/94 mmHg  Pulse 83  Temp(Src) 98.2 F (36.8 C)  Resp 13  SpO2 100% Physical Exam  Constitutional: He is oriented to person, place, and time. He appears well-developed. No distress.  HENT:  Head: Normocephalic and atraumatic.  Eyes: Conjunctivae and EOM are normal.  Cardiovascular: Regular rhythm.  Tachycardia present.   Pulmonary/Chest: Effort normal. No stridor. No respiratory distress.  Abdominal: He exhibits no distension. There is no tenderness. There is no rebound.  Musculoskeletal: He exhibits no edema.  Neurological: He is alert and oriented to person, place, and time.  Skin: Skin is warm and dry.  Psychiatric: He has a normal mood and affect.  Nursing note and vitals reviewed.   ED Course  Procedures (including critical care time) Labs Review Labs Reviewed  BASIC METABOLIC PANEL - Abnormal; Notable for the following:    Sodium 133 (*)    Potassium 3.3 (*)    Chloride 97 (*)    CO2 21 (*)    Glucose, Bld 140 (*)    BUN <5 (*)    Calcium 8.3 (*)    All other components within normal limits  CBC - Abnormal; Notable for the following:    RBC 3.39 (*)    Hemoglobin 10.3 (*)    HCT 30.1 (*)    RDW 15.9 (*)    Platelets 108 (*)    All other components within normal limits  LIPASE, BLOOD  I-STAT TROPOININ, ED    Imaging Review No results found. I have personally reviewed and evaluated these images and lab results as part of my medical decision-making.   EKG Interpretation   Date/Time:  Thursday January 27 2016 05:48:07 EST Ventricular Rate:  134 PR Interval:  126 QRS Duration: 84 QT Interval:  294 QTC Calculation: 439 R Axis:   54 Text Interpretation:  Sinus tachycardia Nonspecific ST abnormality  Abnormal ECG Sinus tachycardia ST-t wave abnormality Abnormal ekg  Confirmed by Carmin Muskrat  MD ([id number]) on 01/27/2016 7:05:37 AM     Cardiac 85 sinus normal  9:03 AM Patient awake, alert, in no distress, states that he feels better. We  discussed all findings per Patient was started on Zofran, ODT, follow up with GI as needed.  MDM  Patient presents with one week of ongoing nausea, vomiting, diarrhea. Initially, the patient is tachycardic, though this resolves here. Patient has no abdominal pain at all, and after fluid resuscitation, states that he feels better. He remains hemodynamically stable. No evidence for ongoing peritonitis, bacteremia, sepsis. Patient started on antibiotics, provided dietary instructions, will follow up with GI if he does not improve in the coming days.    Carmin Muskrat, MD 01/27/16 [phone]

## 2016-05-22 ENCOUNTER — Emergency Department (HOSPITAL_COMMUNITY): Payer: Medicaid Other

## 2016-05-22 ENCOUNTER — Inpatient Hospital Stay (HOSPITAL_COMMUNITY)
Admission: EM | Admit: 2016-05-22 | Discharge: 2016-05-24 | DRG: 433 | Disposition: A | Discharge status: Home / Self Care | Payer: Medicaid Other | Attending: Family Medicine | Admitting: Family Medicine

## 2016-05-22 ENCOUNTER — Encounter (HOSPITAL_COMMUNITY): Payer: Self-pay | Admitting: Emergency Medicine

## 2016-05-22 DIAGNOSIS — Z8249 Family history of ischemic heart disease and other diseases of the circulatory system: Secondary | ICD-10-CM | POA: Diagnosis not present

## 2016-05-22 DIAGNOSIS — Y906 Blood alcohol level of 120-199 mg/100 ml: Secondary | ICD-10-CM | POA: Diagnosis present

## 2016-05-22 DIAGNOSIS — R000 Tachycardia, unspecified: Secondary | ICD-10-CM | POA: Diagnosis not present

## 2016-05-22 DIAGNOSIS — K7460 Unspecified cirrhosis of liver: Secondary | ICD-10-CM | POA: Diagnosis not present

## 2016-05-22 DIAGNOSIS — F10239 Alcohol dependence with withdrawal, unspecified: Secondary | ICD-10-CM | POA: Diagnosis present

## 2016-05-22 DIAGNOSIS — K766 Portal hypertension: Secondary | ICD-10-CM | POA: Diagnosis present

## 2016-05-22 DIAGNOSIS — J90 Pleural effusion, not elsewhere classified: Secondary | ICD-10-CM | POA: Diagnosis present

## 2016-05-22 DIAGNOSIS — F419 Anxiety disorder, unspecified: Secondary | ICD-10-CM | POA: Diagnosis present

## 2016-05-22 DIAGNOSIS — Z87891 Personal history of nicotine dependence: Secondary | ICD-10-CM

## 2016-05-22 DIAGNOSIS — K7031 Alcoholic cirrhosis of liver with ascites: Principal | ICD-10-CM | POA: Diagnosis present

## 2016-05-22 DIAGNOSIS — G43909 Migraine, unspecified, not intractable, without status migrainosus: Secondary | ICD-10-CM | POA: Diagnosis present

## 2016-05-22 DIAGNOSIS — F10230 Alcohol dependence with withdrawal, uncomplicated: Secondary | ICD-10-CM | POA: Diagnosis not present

## 2016-05-22 DIAGNOSIS — F1010 Alcohol abuse, uncomplicated: Secondary | ICD-10-CM

## 2016-05-22 DIAGNOSIS — I10 Essential (primary) hypertension: Secondary | ICD-10-CM | POA: Diagnosis present

## 2016-05-22 DIAGNOSIS — Z6821 Body mass index (BMI) 21.0-21.9, adult: Secondary | ICD-10-CM | POA: Diagnosis not present

## 2016-05-22 DIAGNOSIS — R188 Other ascites: Secondary | ICD-10-CM

## 2016-05-22 DIAGNOSIS — E871 Hypo-osmolality and hyponatremia: Secondary | ICD-10-CM | POA: Diagnosis present

## 2016-05-22 DIAGNOSIS — I671 Cerebral aneurysm, nonruptured: Secondary | ICD-10-CM | POA: Diagnosis present

## 2016-05-22 DIAGNOSIS — J9811 Atelectasis: Secondary | ICD-10-CM | POA: Diagnosis present

## 2016-05-22 DIAGNOSIS — K760 Fatty (change of) liver, not elsewhere classified: Secondary | ICD-10-CM | POA: Diagnosis present

## 2016-05-22 DIAGNOSIS — N390 Urinary tract infection, site not specified: Secondary | ICD-10-CM | POA: Diagnosis present

## 2016-05-22 DIAGNOSIS — K7030 Alcoholic cirrhosis of liver without ascites: Secondary | ICD-10-CM | POA: Insufficient documentation

## 2016-05-22 DIAGNOSIS — F10930 Alcohol use, unspecified with withdrawal, uncomplicated: Secondary | ICD-10-CM

## 2016-05-22 HISTORY — DX: Other problems related to lifestyle: Z72.89

## 2016-05-22 LAB — BODY FLUID CELL COUNT WITH DIFFERENTIAL
Lymphs, Fluid: 47 %
Monocyte-Macrophage-Serous Fluid: 31 % — ABNORMAL LOW (ref 50–90)
Neutrophil Count, Fluid: 22 % (ref 0–25)
Total Nucleated Cell Count, Fluid: 34 cu mm (ref 0–1000)

## 2016-05-22 LAB — URINE MICROSCOPIC-ADD ON

## 2016-05-22 LAB — COMPREHENSIVE METABOLIC PANEL
ALBUMIN: 2.2 g/dL — AB (ref 3.5–5.0)
ALT: 27 U/L (ref 17–63)
AST: 93 U/L — AB (ref 15–41)
Alkaline Phosphatase: 70 U/L (ref 38–126)
Anion gap: 10 (ref 5–15)
CHLORIDE: 98 mmol/L — AB (ref 101–111)
CO2: 23 mmol/L (ref 22–32)
Calcium: 8.2 mg/dL — ABNORMAL LOW (ref 8.9–10.3)
Creatinine, Ser: 0.68 mg/dL (ref 0.61–1.24)
GFR calc Af Amer: >60 mL/min (ref >60)
GLUCOSE: 121 mg/dL — AB (ref 65–99)
POTASSIUM: 3.6 mmol/L (ref 3.5–5.1)
Sodium: 131 mmol/L — ABNORMAL LOW (ref 135–145)
TOTAL PROTEIN: 10.5 g/dL — AB (ref 6.5–8.1)
Total Bilirubin: 2.7 mg/dL — ABNORMAL HIGH (ref 0.3–1.2)

## 2016-05-22 LAB — CBC
HEMATOCRIT: 30.8 % — AB (ref 39.0–52.0)
HEMOGLOBIN: 10.6 g/dL — AB (ref 13.0–17.0)
MCH: 28.4 pg (ref 26.0–34.0)
MCHC: 34.4 g/dL (ref 30.0–36.0)
MCV: 82.6 fL (ref 78.0–100.0)
Platelets: 187 10*3/uL (ref 150–400)
RBC: 3.73 MIL/uL — ABNORMAL LOW (ref 4.22–5.81)
RDW: 15 % (ref 11.5–15.5)
WBC: 6.6 10*3/uL (ref 4.0–10.5)

## 2016-05-22 LAB — URINALYSIS, ROUTINE W REFLEX MICROSCOPIC
Glucose, UA: NEGATIVE mg/dL
Hgb urine dipstick: NEGATIVE
Ketones, ur: 15 mg/dL — AB
NITRITE: POSITIVE — AB
Protein, ur: NEGATIVE mg/dL
SPECIFIC GRAVITY, URINE: 1.021 (ref 1.005–1.030)
pH: 5.5 (ref 5.0–8.0)

## 2016-05-22 LAB — TROPONIN I

## 2016-05-22 LAB — LACTATE DEHYDROGENASE, PLEURAL OR PERITONEAL FLUID: LD, Fluid: 89 U/L — ABNORMAL HIGH (ref 3–23)

## 2016-05-22 LAB — LIPASE, BLOOD: LIPASE: 18 U/L (ref 11–51)

## 2016-05-22 LAB — AMMONIA: AMMONIA: 30 umol/L (ref 9–35)

## 2016-05-22 LAB — RAPID URINE DRUG SCREEN, HOSP PERFORMED
AMPHETAMINES: NOT DETECTED
Barbiturates: NOT DETECTED
Benzodiazepines: NOT DETECTED
COCAINE: NOT DETECTED
OPIATES: NOT DETECTED
Tetrahydrocannabinol: POSITIVE — AB

## 2016-05-22 LAB — GRAM STAIN

## 2016-05-22 LAB — GLUCOSE, SEROUS FLUID: GLUCOSE FL: 105 mg/dL

## 2016-05-22 LAB — PROTIME-INR
INR: 1.63 — ABNORMAL HIGH (ref 0.00–1.49)
Prothrombin Time: 19.4 seconds — ABNORMAL HIGH (ref 11.6–15.2)

## 2016-05-22 LAB — PROTEIN, BODY FLUID: Total protein, fluid: 3.9 g/dL

## 2016-05-22 LAB — ALBUMIN, FLUID (OTHER): Albumin, Fluid: 1.1 g/dL

## 2016-05-22 LAB — ETHANOL: Alcohol, Ethyl (B): 145 mg/dL — ABNORMAL HIGH (ref <5)

## 2016-05-22 MED ORDER — IBUPROFEN 200 MG PO TABS: 400.0000 mg | ORAL_TABLET | Freq: Four times a day (QID) | ORAL | Status: DC | PRN

## 2016-05-22 MED ORDER — VITAMIN B-1 100 MG PO TABS
100.0000 mg | ORAL_TABLET | Freq: Every day | ORAL | Status: DC
  Administered 2016-05-22 – 2016-05-24 (×3): 100 mg via ORAL
  Filled 2016-05-22 (×3): qty 1

## 2016-05-22 MED ORDER — FUROSEMIDE 40 MG PO TABS
20.0000 mg | ORAL_TABLET | Freq: Every day | ORAL | Status: DC
  Administered 2016-05-22 – 2016-05-23 (×2): 20 mg via ORAL
  Filled 2016-05-22 (×2): qty 1

## 2016-05-22 MED ORDER — LIDOCAINE HCL (PF) 1 % IJ SOLN
INTRAMUSCULAR | Status: AC
  Filled 2016-05-22: qty 10

## 2016-05-22 MED ORDER — SODIUM CHLORIDE 0.9 % IV SOLN
INTRAVENOUS | Status: AC
  Administered 2016-05-22: 22:00:00 via INTRAVENOUS

## 2016-05-22 MED ORDER — THIAMINE HCL 100 MG/ML IJ SOLN: 100.0000 mg | Freq: Every day | INTRAMUSCULAR | Status: DC

## 2016-05-22 MED ORDER — LORAZEPAM 2 MG/ML IJ SOLN: 0.0000 mg | Freq: Two times a day (BID) | INTRAMUSCULAR | Status: DC

## 2016-05-22 MED ORDER — SODIUM CHLORIDE 0.9 % IV SOLN
INTRAVENOUS | Status: DC
  Administered 2016-05-22: 17:00:00 via INTRAVENOUS

## 2016-05-22 MED ORDER — LORAZEPAM 2 MG/ML IJ SOLN
0.0000 mg | Freq: Four times a day (QID) | INTRAMUSCULAR | Status: AC
  Administered 2016-05-23 – 2016-05-24 (×3): 1 mg via INTRAVENOUS
  Administered 2016-05-24: 2 mg via INTRAVENOUS
  Filled 2016-05-22 (×4): qty 1

## 2016-05-22 MED ORDER — SODIUM CHLORIDE 0.9 % IV BOLUS (SEPSIS)
500.0000 mL | Freq: Once | INTRAVENOUS | Status: AC
  Administered 2016-05-22: 500 mL via INTRAVENOUS

## 2016-05-22 MED ORDER — SODIUM CHLORIDE 0.9% FLUSH
3.0000 mL | Freq: Two times a day (BID) | INTRAVENOUS | Status: DC
  Administered 2016-05-23 – 2016-05-24 (×2): 3 mL via INTRAVENOUS

## 2016-05-22 MED ORDER — SPIRONOLACTONE 25 MG PO TABS
50.0000 mg | ORAL_TABLET | Freq: Every day | ORAL | Status: DC
  Administered 2016-05-22 – 2016-05-23 (×2): 50 mg via ORAL
  Filled 2016-05-22 (×2): qty 1
  Filled 2016-05-22: qty 2

## 2016-05-22 MED ORDER — LORAZEPAM 2 MG/ML IJ SOLN
1.0000 mg | Freq: Once | INTRAMUSCULAR | Status: AC
  Administered 2016-05-22: 1 mg via INTRAVENOUS
  Filled 2016-05-22: qty 1

## 2016-05-22 MED ORDER — HYDRALAZINE HCL 20 MG/ML IJ SOLN
5.0000 mg | INTRAMUSCULAR | Status: DC | PRN
  Administered 2016-05-22 – 2016-05-24 (×2): 5 mg via INTRAVENOUS
  Filled 2016-05-22 (×3): qty 1

## 2016-05-22 NOTE — B[facility] Counselor (Signed)
Called charge nurse at [facility] to order cart for tele-psych. Nurse will put cart in TTS for tele-psych. Shelma Eiben K. Nash Shearer, LPC-A, [facility]  Counselor 05/22/2016 1:03 PM

## 2016-05-22 NOTE — Progress Notes (Signed)
Brief attending H&PE note.  I have discussed with residents and will cosign the H&PE when it is ready.  We have agreed on their documentation and management.  Briefly, 50 yo who presents to ER with painless abd swelling for one month.  Found to have ascites and is now SP large volume paracentesis.  Per patient and wife, no previous diagnosis of liver disease or cirrhosis.  Issues 1. Cirrhosis, we need to clarify etiology.  Is it purely the obvious alcohol or does he have other contributing factors.  Appreciate GI help. 2. At risk for ETOH withdrawal.  Cover with CIWA. 3. Alcohol abuse.  Needs to change lifestyle promptly given now proven liver disease.

## 2016-05-22 NOTE — Consult Note (Signed)
Consultation  Referring Provider: ER MD -Dr Lajuan Lines Primary Care Physician:   [facility] medicine clinic Primary Gastroenterologist:  None/unassigned  Reason for Consultation:  New ascites /Cirrhosis HPI: Stephen Carroll is a 50 y.o. male  who presented to the emergency room today with complaints of progressive abdominal swelling over the past 1 month. He underwent upper abdominal ultrasound which showed a large amount of ascites and a nodular liver consistent with cirrhosis. Chest x-ray showed a moderate right pleural effusion. Patient has long history of EtOH abuse, he states usually just drinks on the weekends but generally will drink a case a day and has been doing this since she was 19. He is unaware of any prior diagnosis of liver disease. He was hospitalized in August 2016 with an EtOH induced seizure and had alcohol withdrawal. Patient has no complaints of abdominal pain and no nausea or vomiting, denies any peripheral edema. His wife states that he hasn't been eating much but other than that has seemed fine. Labs on admission show EtOH level of 145, drug screen positive for THC, hemoglobin 10.8, albumin 2.2, total bilirubin of 2.7 AST of 93 Pro time is 19.1/INR of 1.6. He also has positive UA ER physician ordered a large volume paracentesis which has already been done full of 6 L. Labs were sent for cell counts etc. He feels fine post paracentesis.   Past Medical History  Diagnosis Date  . Migraine   . Alcohol use [facility])     Past Surgical History  Procedure Laterality Date  . Back surgery      Pt. reports rods placed in back    Prior to Admission medications   Medication Sig Start Date End Date Taking? Authorizing Provider  ondansetron (ZOFRAN ODT) 4 MG disintegrating tablet Take 1 tablet (4 mg total) by mouth every 8 (eight) hours as needed for nausea or vomiting. 01/27/16  Yes Carmin Muskrat, MD    Current Facility-Administered Medications  Medication Dose Route  Frequency Provider Last Rate Last Dose  . 0.9 %  sodium chloride infusion   Intravenous Continuous Francine Graven, DO      . lidocaine (PF) (XYLOCAINE) 1 % injection           . LORazepam (ATIVAN) injection 0-4 mg  0-4 mg Intravenous Q6H Francine Graven, DO   0 mg at 05/22/16 1259   Followed by  . [START ON 05/24/2016] LORazepam (ATIVAN) injection 0-4 mg  0-4 mg Intravenous Q12H Francine Graven, DO      . thiamine (VITAMIN B-1) tablet 100 mg  100 mg Oral Daily Francine Graven, DO   100 mg at 05/22/16 1256   Or  . thiamine (B-1) injection 100 mg  100 mg Intravenous Daily Francine Graven, DO       Current Outpatient Prescriptions  Medication Sig Dispense Refill  . ondansetron (ZOFRAN ODT) 4 MG disintegrating tablet Take 1 tablet (4 mg total) by mouth every 8 (eight) hours as needed for nausea or vomiting. 20 tablet 0    Allergies as of 05/22/2016  . (No Known Allergies)    History reviewed. No pertinent family history.  Social History   Social History  . Marital Status: Single    Spouse Name: N/A  . Number of Children: N/A  . Years of Education: N/A   Occupational History  . Not on file.   Social History Main Topics  . Smoking status: Former Smoker -- 0.00 packs/day    Types: Cigarettes  . Smokeless tobacco:  Not on file  . Alcohol Use: Yes     Comment: daily  . Drug Use: Yes    Special: Marijuana  . Sexual Activity: Not on file   Other Topics Concern  . Not on file   Social History Narrative    Review of Systems: Pertinent positive and negative review of systems were noted in the above HPI section.  All other review of systems was otherwise negative.Marland Kitchen  Physical Exam: Vital signs in last 24 hours: Temp:  [98.4 F (36.9 C)] 98.4 F (36.9 C) (06/12 1102) Pulse Rate:  [108-136] 108 (06/12 1534) Resp:  [16-23] 16 (06/12 1534) BP: (144-187)/(100-117) 160/103 mmHg (06/12 1534) SpO2:  [97 %-100 %] 99 % (06/12 1534) Weight:  [150 lb (68.04 kg)] 150 lb (68.04 kg)  (06/12 1102)   General:   Alert,  Well-developed, Very thin chronically ill-appearing African-American male, pleasant and cooperative in NAD Head:  Normocephalic and atraumatic. Eyes:  Sclera clear, no icterus.   Conjunctiva pink. Ears:  Normal auditory acuity. Nose:  No deformity, discharge,  or lesions. Mouth:  No deformity or lesions.   Neck:  Supple; no masses or thyromegaly. Lungs:  Clear throughout to auscultation.   No wheezes, crackles, or rhonchi. Heart:  Regular rate and rhythm; no murmurs, clicks, rubs,  or gallops. Abdomen:  Soft, nontender ascites nontender BS active,nonpalp , liver is palpable down 3 fingerbreadths from the right costal margin   Rectal:  Deferred  Msk:  Symmetrical without gross deformities. . Pulses:  Normal pulses noted. Extremities:  Without clubbing or edema. Neurologic:  Alert and  oriented x4;  grossly normal neurologically., No asterixis Skin:  Intact without significant lesions or rashes.. Psych:  Alert and cooperative. Normal mood and affect.  Intake/Output from previous day:   Intake/Output this shift:    Lab Results:  Recent Labs  05/22/16 1135  WBC 6.6  HGB 10.6*  HCT 30.8*  PLT 187   BMET  Recent Labs  05/22/16 1135  NA 131*  K 3.6  CL 98*  CO2 23  GLUCOSE 121*  BUN <5*  CREATININE 0.68  CALCIUM 8.2*   LFT  Recent Labs  05/22/16 1135  PROT 10.5*  ALBUMIN 2.2*  AST 93*  ALT 27  ALKPHOS 70  BILITOT 2.7*   PT/INR  Recent Labs  05/22/16 1135  LABPROT 19.4*  INR 1.63*   Hepatitis Panel No results for input(s): HEPBSAG, HCVAB, HEPAIGM, HEPBIGM in the last 72 hours.   IMPRESSION:   #65 50 year old African-American male with new onset of large volume ascites, in setting of chronic EtOH abuse. No previous diagnosis of cirrhosis but ultrasound today confirms a cirrhotic-appearing liver. Patient has had a large-volume paracentesis and he tolerated without difficulty and cell counts are pending. 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 Start low-dose diuretics with Lasix 20 mg by mouth every morning and Aldactone 50 mg by mouth every morning He will need a 2 g sodium diet Will check AFP, ammonia and viral hepatitis serologies Eventually will need an EGD to screen for varices Watch for alcohol withdrawal Patient needs to stop drinking altogether We will follow during this admission    Amy Esterwood  05/22/2016, 3:57 PM

## 2016-05-22 NOTE — ED Notes (Signed)
Pt c/o abd swelling x 2 months-- NO hx ascites-- states drinks 2-3 beers a day-- and family noticed abd swelling-- abd firm/distended/nontender--

## 2016-05-22 NOTE — ED Provider Notes (Signed)
CSN: [id number]     Arrival date & time 05/22/16  [id number] History   First MD Initiated Contact with Patient 05/22/16 1125     Chief Complaint  Patient presents with  . Abdominal Pain      HPI  Pt was seen at 1130.  Per pt, c/o gradual onset and worsening of constant "abd swelling" for the past 1 month.  Has been associated with no other symptoms. Endorses hx of daily etoh. Denies abd pain. Denies N/V, no diarrhea, no fevers, no back pain, no rash, no CP/SOB, no black or blood in stools.      Past Medical History  Diagnosis Date  . Migraine   . Alcohol use [facility])    Past Surgical History  Procedure Laterality Date  . Back surgery      Pt. reports rods placed in back    Social History  Substance Use Topics  . Smoking status: Former Smoker -- 0.00 packs/day    Types: Cigarettes  . Smokeless tobacco: None  . Alcohol Use: Yes     Comment: daily    Review of Systems ROS: Statement: All systems negative except as marked or noted in the HPI; Constitutional: Negative for fever and chills. ; ; Eyes: Negative for eye pain, redness and discharge. ; ; ENMT: Negative for ear pain, hoarseness, nasal congestion, sinus pressure and sore throat. ; ; Cardiovascular: Negative for chest pain, palpitations, diaphoresis, dyspnea and peripheral edema. ; ; Respiratory: Negative for cough, wheezing and stridor. ; ; Gastrointestinal: +"abd swelling." Negative for nausea, vomiting, diarrhea, abdominal pain, blood in stool, hematemesis, jaundice and rectal bleeding. . ; ; Genitourinary: Negative for dysuria, flank pain and hematuria. ; ; Musculoskeletal: Negative for back pain and neck pain. Negative for swelling and trauma.; ; Skin: Negative for pruritus, rash, abrasions, blisters, bruising and skin lesion.; ; Neuro: Negative for headache, lightheadedness and neck stiffness. Negative for weakness, altered level of consciousness, altered mental status, extremity weakness, paresthesias, involuntary movement, seizure  and syncope.      Allergies  Review of patient's allergies indicates no known allergies.  Home Medications   Prior to Admission medications   Medication Sig Start Date End Date Taking? Authorizing Provider  ondansetron (ZOFRAN ODT) 4 MG disintegrating tablet Take 1 tablet (4 mg total) by mouth every 8 (eight) hours as needed for nausea or vomiting. 01/27/16   Carmin Muskrat, MD   BP 159/114 mmHg  Pulse 136  Temp(Src) 98.4 F (36.9 C) (Oral)  Resp 16  Ht 5\' 8"  (1.727 m)  Wt 150 lb (68.04 kg)  BMI 22.81 kg/m2  SpO2 98% Physical Exam  1135: Physical examination:  Nursing notes reviewed; Vital signs and O2 SAT reviewed;  Constitutional: Well developed, Well nourished, Well hydrated, In no acute distress; Head:  Normocephalic, atraumatic; Eyes: EOMI, PERRL, No scleral icterus; ENMT: Mouth and pharynx normal, Mucous membranes moist; Neck: Supple, Full range of motion, No lymphadenopathy; Cardiovascular: Tachycardic rate and rhythm, No gallop; Respiratory: Breath sounds clear & equal bilaterally, No wheezes.  Speaking full sentences with ease, Normal respiratory effort/excursion; Chest: Nontender, Movement normal; Abdomen: Soft, Nontender, +distended, Normal bowel sounds; Genitourinary: No CVA tenderness; Extremities: Pulses normal, No tenderness, No edema, No calf edema or asymmetry.; Neuro: AA&Ox3, Major CN grossly intact.  Speech clear. No gross focal motor or sensory deficits in extremities.; Skin: Color normal, Warm, Dry.   ED Course  Procedures (including critical care time) Labs Review  Imaging Review  I have personally reviewed and evaluated these  images and lab results as part of my medical decision-making.   EKG Interpretation   Date/Time:  Monday May 22 2016 11:11:07 EDT Ventricular Rate:  136 PR Interval:  126 QRS Duration: 80 QT Interval:  288 QTC Calculation: 433 R Axis:   131 Text Interpretation:  Sinus tachycardia Right axis deviation Cannot rule  out Anterior  infarct , age undetermined Nonspecific ST and T wave  abnormality Anterior leads Nonspecific ST and T wave abnormality Inferior  leads When compared with ECG of 01/27/2016 Nonspecific ST and T wave  abnormality is now Present Confirmed by [facility]  MD, Nunzio Cory [phone]) on  05/22/2016 12:02:36 PM      MDM  MDM Reviewed: previous chart, nursing note and vitals Reviewed previous: labs and ECG Interpretation: labs, ECG, x-ray and ultrasound      Results for orders placed or performed during the hospital encounter of 05/22/16  Lipase, blood  Result Value Ref Range   Lipase 18 11 - 51 U/L  Comprehensive metabolic panel  Result Value Ref Range   Sodium 131 (L) 135 - 145 mmol/L   Potassium 3.6 3.5 - 5.1 mmol/L   Chloride 98 (L) 101 - 111 mmol/L   CO2 23 22 - 32 mmol/L   Glucose, Bld 121 (H) 65 - 99 mg/dL   BUN <5 (L) 6 - 20 mg/dL   Creatinine, Ser 0.68 0.61 - 1.24 mg/dL   Calcium 8.2 (L) 8.9 - 10.3 mg/dL   Total Protein 10.5 (H) 6.5 - 8.1 g/dL   Albumin 2.2 (L) 3.5 - 5.0 g/dL   AST 93 (H) 15 - 41 U/L   ALT 27 17 - 63 U/L   Alkaline Phosphatase 70 38 - 126 U/L   Total Bilirubin 2.7 (H) 0.3 - 1.2 mg/dL   GFR calc non Af Amer >60 >60 mL/min   GFR calc Af Amer >60 >60 mL/min   Anion gap 10 5 - 15  CBC  Result Value Ref Range   WBC 6.6 4.0 - 10.5 K/uL   RBC 3.73 (L) 4.22 - 5.81 MIL/uL   Hemoglobin 10.6 (L) 13.0 - 17.0 g/dL   HCT 30.8 (L) 39.0 - 52.0 %   MCV 82.6 78.0 - 100.0 fL   MCH 28.4 26.0 - 34.0 pg   MCHC 34.4 30.0 - 36.0 g/dL   RDW 15.0 11.5 - 15.5 %   Platelets 187 150 - 400 K/uL  Urinalysis, Routine w reflex microscopic  Result Value Ref Range   Color, Urine ORANGE (A) YELLOW   APPearance CLEAR CLEAR   Specific Gravity, Urine 1.021 1.005 - 1.030   pH 5.5 5.0 - 8.0   Glucose, UA NEGATIVE NEGATIVE mg/dL   Hgb urine dipstick NEGATIVE NEGATIVE   Bilirubin Urine SMALL (A) NEGATIVE   Ketones, ur 15 (A) NEGATIVE mg/dL   Protein, ur NEGATIVE NEGATIVE mg/dL   Nitrite  POSITIVE (A) NEGATIVE   Leukocytes, UA SMALL (A) NEGATIVE  Ammonia  Result Value Ref Range   Ammonia 30 9 - 35 umol/L  Urine rapid drug screen (hosp performed)  Result Value Ref Range   Opiates NONE DETECTED NONE DETECTED   Cocaine NONE DETECTED NONE DETECTED   Benzodiazepines NONE DETECTED NONE DETECTED   Amphetamines NONE DETECTED NONE DETECTED   Tetrahydrocannabinol POSITIVE (A) NONE DETECTED   Barbiturates NONE DETECTED NONE DETECTED  Ethanol  Result Value Ref Range   Alcohol, Ethyl (B) 145 (H) <5 mg/dL  Troponin I  Result Value Ref Range   Troponin I <  0.03 <0.031 ng/mL  Protime-INR  Result Value Ref Range   Prothrombin Time 19.4 (H) 11.6 - 15.2 seconds   INR 1.63 (H) 0.00 - 1.49  Urine microscopic-add on  Result Value Ref Range   Squamous Epithelial / LPF 6-30 (A) NONE SEEN   WBC, UA 6-30 0 - 5 WBC/hpf   RBC / HPF 0-5 0 - 5 RBC/hpf   Bacteria, UA FEW (A) NONE SEEN   Casts HYALINE CASTS (A) NEGATIVE   Urine-Other MUCOUS PRESENT    US Abdomen Complete 05/22/2016  CLINICAL DATA:  Abdominal swelling. EXAM: ABDOMEN ULTRASOUND COMPLETE COMPARISON:  06/15/2014 FINDINGS: Gallbladder: Gallbladder has a normal appearance. Gallbladder wall is 2.6 mm, within normal limits. No stones or pericholecystic fluid. No sonographic Murphy's sign. Common bile duct: Diameter: 5.6 mm Liver: Liver contour is nodular. The liver is echogenic. There is attenuation of the ultrasound wave, poor visualization of the internal hepatic architecture, and loss of definition of the diaphragm. No focal liver lesions are identified. IVC: Visualized portion is within normal limits. Pancreas: Pancreas is not well seen because of bowel gas. Spleen: Size and appearance within normal limits. Right Kidney: Length: 12.2 cm. Echogenicity within normal limits. No mass or hydronephrosis visualized. Left Kidney: Length: 12.5 cm. Echogenicity within normal limits. No mass or hydronephrosis visualized. Abdominal aorta: No aneurysm  visualized. Other findings: Moderate amount of ascites. IMPRESSION: 1. Ascites. 2. Cirrhotic morphology of the liver. 3. Hepatic steatosis. 4. No evidence for acute cholecystitis. Electronically Signed   By: Nolon Nations M.D.   On: 05/22/2016 12:38   Dg Abd Acute W/chest 05/22/2016  CLINICAL DATA:  Abdominal swelling for 2 months. History of ascites. EXAM: DG ABDOMEN ACUTE W/ 1V CHEST COMPARISON:  CT abdomen and pelvis 06/15/2014. PA and lateral chest 08/07/2015. FINDINGS: Single-view of the chest demonstrates a small to moderate right pleural effusion. No left effusion is identified. Right basilar atelectasis is noted. The lungs are otherwise clear. Heart size is normal. Heterotopic ossification and degenerative disease about the left shoulder noted. Two views of the abdomen show no free intraperitoneal air. Bowel loops appear centralized compatible with ascites. There is no evidence of bowel obstruction. Postoperative change of upper lumbar fusion noted. IMPRESSION: Findings consistent with abdominal ascites. Small to moderate right pleural effusion and basilar atelectasis. Electronically Signed   By: Inge Rise M.D.   On: 05/22/2016 12:43    1335:  Concern for etoh withdrawal; CIWA protocol ordered and ativan IV given. Dx and therapeutic paracentesis pending. T/C to GI [facility], case discussed, including:  HPI, pertinent PM/SHx, VS/PE, dx testing, ED course and treatment:  Agreeable to consult. T/C to [facility] Resident, case discussed, including:  HPI, pertinent PM/SHx, VS/PE, dx testing, ED course and treatment:  Agreeable to admit, requests to write temporary orders, obtain tele bed to Dr. Lowella Bandy service.   Francine Graven, DO 05/24/16 2255

## 2016-05-22 NOTE — Procedures (Signed)
   US guided RLQ paracentesis  6 liters yellow fluid collected Tolerated well  Sent for labs per MD

## 2016-05-22 NOTE — ED Notes (Signed)
Pt in US

## 2016-05-22 NOTE — Care Management Note (Signed)
Case Management Note  Patient Details  Name: PARV RASHID MRN: [id number] Date of Birth: 07/20/1966  Subjective/Objective:                  50 y.o. male who presented to the emergency room today with complaints of progressive abdominal swelling over the past 1 month. He underwent upper abdominal ultrasound which showed a large amount of ascites and a nodular liver consistent with cirrhosis. / From home alone.  Action/Plan: Follow for disposition needs.   Expected Discharge Date:        05/23/16          Expected Discharge Plan:  Home with home health  In-House Referral:     Discharge planning Services  CM Consult  Post Acute Care Choice:    Choice offered to:     DME Arranged:    DME Agency:     HH Arranged:    [place] Agency:     Status of Service:  In process, will continue to follow  Medicare Important Message Given:    Date Medicare IM Given:    Medicare IM give by:    Date Additional Medicare IM Given:    Additional Medicare Important Message give by:     If discussed at [place] of Stay Meetings, dates discussed:    Additional Comments:  Fuller Mandril, RN 05/22/2016, 4:17 PM
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
## 2016-05-22 NOTE — Progress Notes (Signed)
Brief attending H&PE note.  I have discussed with residents and will cosign the H&PE when it is ready.  We have agreed on their documentation and management.  Briefly, 50 yo male who drinks a significant amount of beer presents to ER today with painless swelling of the abd.  Found to have ascites and is now status post large volume paracentesis.  Per patient and wife, he has not had the previous diagnosis of liver problems or cirrhosis. Imp: New onset cirrhosis.  Highly likely to be alcoholic cirrhosis.  We need to be thorough and check for other causes of cirrhosis such as chronic hep b or c, hemochromotosis, etc.  Appreciate GI help. At risk for alcohol withdrawal.  Cover with CIWA protocol Alcohol abuse, offer counseling.  Needs to change lifestyle.

## 2016-05-22 NOTE — ED Notes (Signed)
Admitting at bedside 

## 2016-05-23 LAB — COMPREHENSIVE METABOLIC PANEL
ALK PHOS: 69 U/L (ref 38–126)
ALT: 23 U/L (ref 17–63)
ANION GAP: 9 (ref 5–15)
AST: 76 U/L — ABNORMAL HIGH (ref 15–41)
Albumin: 2 g/dL — ABNORMAL LOW (ref 3.5–5.0)
BILIRUBIN TOTAL: 3.3 mg/dL — AB (ref 0.3–1.2)
BUN: <5 mg/dL — ABNORMAL LOW (ref 6–20)
CALCIUM: 7.9 mg/dL — AB (ref 8.9–10.3)
CO2: 22 mmol/L (ref 22–32)
Chloride: 97 mmol/L — ABNORMAL LOW (ref 101–111)
Creatinine, Ser: 0.63 mg/dL (ref 0.61–1.24)
GFR calc non Af Amer: >60 mL/min (ref >60)
Glucose, Bld: 94 mg/dL (ref 65–99)
Potassium: 3.4 mmol/L — ABNORMAL LOW (ref 3.5–5.1)
SODIUM: 128 mmol/L — AB (ref 135–145)
TOTAL PROTEIN: 9.4 g/dL — AB (ref 6.5–8.1)

## 2016-05-23 LAB — CBC
HCT: 32.2 % — ABNORMAL LOW (ref 39.0–52.0)
HEMOGLOBIN: 11 g/dL — AB (ref 13.0–17.0)
MCH: 28.3 pg (ref 26.0–34.0)
MCHC: 34.2 g/dL (ref 30.0–36.0)
MCV: 82.8 fL (ref 78.0–100.0)
Platelets: 140 10*3/uL — ABNORMAL LOW (ref 150–400)
RBC: 3.89 MIL/uL — AB (ref 4.22–5.81)
RDW: 14.8 % (ref 11.5–15.5)
WBC: 10.1 10*3/uL (ref 4.0–10.5)

## 2016-05-23 LAB — HEPATITIS PANEL, ACUTE
HCV Ab: 0.2 s/co ratio (ref 0.0–0.9)
HEP A IGM: NEGATIVE
HEP B C IGM: NEGATIVE
Hepatitis B Surface Ag: NEGATIVE

## 2016-05-23 LAB — PROTIME-INR
INR: 1.8 — AB (ref 0.00–1.49)
Prothrombin Time: 20.8 seconds — ABNORMAL HIGH (ref 11.6–15.2)

## 2016-05-23 LAB — AFP TUMOR MARKER: AFP-Tumor Marker: 2.6 ng/mL (ref 0.0–8.3)

## 2016-05-23 LAB — FERRITIN: Ferritin: 348 ng/mL — ABNORMAL HIGH (ref 24–336)

## 2016-05-23 LAB — PHOSPHORUS: Phosphorus: 2.9 mg/dL (ref 2.5–4.6)

## 2016-05-23 LAB — MAGNESIUM: MAGNESIUM: 1.5 mg/dL — AB (ref 1.7–2.4)

## 2016-05-23 MED ORDER — METOPROLOL TARTRATE 25 MG PO TABS
25.0000 mg | ORAL_TABLET | Freq: Two times a day (BID) | ORAL | Status: DC
  Administered 2016-05-23 (×2): 25 mg via ORAL
  Filled 2016-05-23 (×2): qty 1

## 2016-05-23 MED ORDER — METOPROLOL TARTRATE 25 MG PO TABS: 25.0000 mg | ORAL_TABLET | Freq: Two times a day (BID) | ORAL | Status: DC

## 2016-05-23 MED ORDER — FUROSEMIDE 40 MG PO TABS: 40.0000 mg | ORAL_TABLET | Freq: Every day | ORAL | Status: DC

## 2016-05-23 MED ORDER — THIAMINE HCL 100 MG PO TABS: 100.0000 mg | ORAL_TABLET | Freq: Every day | ORAL | Status: DC

## 2016-05-23 MED ORDER — SPIRONOLACTONE 25 MG PO TABS
100.0000 mg | ORAL_TABLET | Freq: Every day | ORAL | Status: DC
  Administered 2016-05-24: 100 mg via ORAL
  Filled 2016-05-23: qty 4

## 2016-05-23 MED ORDER — FUROSEMIDE 40 MG PO TABS
40.0000 mg | ORAL_TABLET | Freq: Every day | ORAL | Status: DC
  Administered 2016-05-24: 40 mg via ORAL
  Filled 2016-05-23: qty 1

## 2016-05-23 MED ORDER — MAGNESIUM SULFATE 50 % IJ SOLN
1.0000 g | Freq: Once | INTRAMUSCULAR | Status: DC
  Filled 2016-05-23: qty 2

## 2016-05-23 MED ORDER — ADULT MULTIVITAMIN W/MINERALS CH
1.0000 | ORAL_TABLET | Freq: Every day | ORAL | Status: DC
  Administered 2016-05-23 – 2016-05-24 (×2): 1 via ORAL
  Filled 2016-05-23 (×2): qty 1

## 2016-05-23 MED ORDER — FOLIC ACID 1 MG PO TABS
1.0000 mg | ORAL_TABLET | Freq: Every day | ORAL | Status: DC
  Administered 2016-05-23 – 2016-05-24 (×2): 1 mg via ORAL
  Filled 2016-05-23 (×2): qty 1

## 2016-05-23 MED ORDER — SPIRONOLACTONE 100 MG PO TABS: 100.0000 mg | ORAL_TABLET | Freq: Every day | ORAL | Status: DC

## 2016-05-23 MED ORDER — MAGNESIUM SULFATE IN D5W 1-5 GM/100ML-% IV SOLN
1.0000 g | Freq: Once | INTRAVENOUS | Status: AC
  Administered 2016-05-23: 1 g via INTRAVENOUS
  Filled 2016-05-23: qty 100

## 2016-05-23 MED ORDER — POTASSIUM CHLORIDE CRYS ER 20 MEQ PO TBCR
40.0000 meq | EXTENDED_RELEASE_TABLET | Freq: Once | ORAL | Status: AC
  Administered 2016-05-23: 40 meq via ORAL
  Filled 2016-05-23: qty 2

## 2016-05-23 MED ORDER — ADULT MULTIVITAMIN W/MINERALS CH: 1.0000 | ORAL_TABLET | Freq: Every day | ORAL | Status: DC

## 2016-05-23 NOTE — ED Notes (Signed)
RN  Attempted to call report to floor; RN to call back

## 2016-05-23 NOTE — Discharge Summary (Signed)
[facility] Discharge Summary  Patient name: Stephen Carroll Medical record number: [id number] Date of birth: 1966-08-22 Age: 50 y.o. Gender: male Date of Admission: 05/22/2016  Date of Discharge: 05/24/16 Admitting Physician: Zenia Resides, MD  Primary Care Provider: No PCP Per Patient Consultants: GI, IR  Indication for Hospitalization: Ascites  Discharge Diagnoses/Problem List:  Ascites  Alcohol abuse Cirrhosis     Disposition: Home  Discharge Condition: Stable   Discharge Exam:  Filed Vitals:   05/24/16 0800 05/24/16 0857  BP: 113/80 99/76  Pulse: 29 102  Temp: 98.7 F (37.1 C)   Resp: 22    Physical Exam: General: Well appearing, no acute distress Cardiovascular: Tachycardic, regular rhythm, no murmurs Respiratory: CTAB, no wheezes Abdomen: Mildly distended, no masses, non tender  Extremities: No peripheral edema Psych: euthymic mood. Affect congruent    Brief Hospital Course:  Pt presented to hospital with painless, abdominal swelling that had been ongoing for the past month. In the the ED pt had abdominal U/S showing cirrhotic liver and ascites. GI was consulted and recommended paracentesis in which he had 6L removed by IR with analysis of ascitic fluid. Pt was admitted for further evaluation of cirrhosis and placed on CIWA protocol for hx of alcohol abuse and delirium tremens. Labs were negative for ascitic fluid infection, Hepatitis and AFP was low. During the course of pt's stay he had elevated BP and tachycardia likely consistent with withdrawal signs in the setting of possibly having undiagnosed HTN. He was started on labetalol bid. CIWA scores throughout hospital course were 0->7->6->12. Anxiety and tremor were managed with IV Ativan. Pt's last CIWA score was 4 and patient was given PO Ativan. GI recommended pt having follow up for [facility] screening and Hepatitis vaccination with them and for management of cirrhosis with likely alcohol  etiology. Pt was given resources by social work for inpatient and outpatient detox programs. At discharge pt had improved BP, his tachycardia had resolved and he remained asymptomatic. Due to hx of alcohol use pt was prescribed 5 Ativan 1 mg tablets, folate and thiamine supplements and given instructions for strict return precautions for withdrawal symptoms.      Issues for Follow Up:  1. Electrolytes (Hyponatemia) 2. HTN 3. Cirrhosis  4. TSH, FT4  Significant Procedures: Paracentesis   Significant Labs and Imaging:   Recent Labs Lab 05/22/16 1135 05/23/16 0503 05/24/16 0354  WBC 6.6 10.1 7.7  HGB 10.6* 11.0* 10.6*  HCT 30.8* 32.2* 32.2*  PLT 187 140* 142*    Recent Labs Lab 05/22/16 1135 05/23/16 0503 05/24/16 0354  NA 131* 128* 127*  K 3.6 3.4* 3.5  CL 98* 97* 99*  CO2 23 22 23   GLUCOSE 121* 94 104*  BUN <5* <5* <5*  CREATININE 0.68 0.63 0.58*  CALCIUM 8.2* 7.9* 8.0*  MG  --  1.5* 1.9  PHOS  --  2.9  --   ALKPHOS 70 69 58  AST 93* 76* 62*  ALT 27 23 20   ALBUMIN 2.2* 2.0* 1.6*      Results/Tests Pending at Time of Discharge:  Discharge Medications:    Medication List    TAKE these medications        furosemide 40 MG tablet  Commonly known as:  LASIX  Take 1 tablet (40 mg total) by mouth daily.     LORazepam 1 MG tablet  Commonly known as:  ATIVAN  Take 1 tablet (1 mg total) by mouth every 4 (four) hours  as needed for anxiety (CIWA >5).     multivitamin with minerals Tabs tablet  Take 1 tablet by mouth daily.     ondansetron 4 MG disintegrating tablet  Commonly known as:  ZOFRAN ODT  Take 1 tablet (4 mg total) by mouth every 8 (eight) hours as needed for nausea or vomiting.     spironolactone 100 MG tablet  Commonly known as:  ALDACTONE  Take 1 tablet (100 mg total) by mouth daily.     thiamine 100 MG tablet  Take 1 tablet (100 mg total) by mouth daily.        Discharge Instructions: Please refer to Patient Instructions section of EMR  for full details.  Patient was counseled important signs and symptoms that should prompt return to medical care, changes in medications, dietary instructions, activity restrictions, and follow up appointments.   Follow-Up Appointments: Follow-up Information    Follow up with Manus Gunning, MD On 06/20/2016.   Specialty:  Gastroenterology   Why:  Fabienne Bruns follow-up at 3:30 pm    Contact information:   [place] [address] [phone]       Follow up with [facility].   Why:  appointment Monday, June 19th, @ 2:15 PM   Contact information:   [address] [place] [id number]      Follow-up Information    Follow up with Manus Gunning, MD On 06/20/2016.   Specialty:  Gastroenterology   Why:  Fabienne Bruns follow-up at 3:30 pm    Contact information:   [address] [place] [address] [phone]       Follow up with [facility] [place].   Why:  appointment Monday, June 19th, @ 2:15 PM   Contact information:   [address] Sidell Vazquez, MD 05/25/2016, 12:31 PM [facility]  Resident Addendum I have separately seen and examined the patient. I have discussed the findings and exam with the medical student and agree with the above note. I helped develop the management plan that is described in the student's note and I agree with the content.With any changes or additions added above   Bria Portales A. Lincoln Brigham MD, [facility] Resident PGY-2 Pager [phone] :

## 2016-05-23 NOTE — Progress Notes (Signed)
Received call from MD Gerarda Fraction regarding patient status, confirmed that patient has agreed to stay another night inpatient for continued observation & last CIWA score with interventions.
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
## 2016-05-23 NOTE — Progress Notes (Signed)
Progress Note   Subjective   Pt says he feels fine, denies anxiety, jitters etc Wants to go home   Peritoneal fluid counts reviewed- No SBP AFP- 2.6 Hepatitis serologies -negative   Objective   Vital signs in last 24 hours: Temp:  [98.4 F (36.9 C)-99.4 F (37.4 C)] 99.4 F (37.4 C) (06/13 1100) Pulse Rate:  [103-143] 143 (06/13 1100) Resp:  [16-46] 22 (06/13 1100) BP: (132-177)/(86-118) 138/101 mmHg (06/13 1100) SpO2:  [91 %-100 %] 99 % (06/13 1100) Weight:  [140 lb 14.4 oz (63.912 kg)] 140 lb 14.4 oz (63.912 kg) (06/13 [id number]) Last BM Date: 05/23/16 General:  AA male in NAD Heart:  Regular rate and rhythm; no murmurs- tachy at 100 Lungs: Respirations even and unlabored, lungs CTA bilaterally Abdomen:  Soft, nontender and nondistended. Normal bowel sounds.non tense ascites Extremities:  Without edema. Neurologic:  Alert and oriented,  grossly normal neurologically. Psych:  Cooperative. Normal mood and affect.  Intake/Output from previous day: 06/12 0701 - 06/13 0700 In: -  Out: 2350 [Urine:2350] Intake/Output this shift:    Lab Results:  Recent Labs  05/22/16 1135 05/23/16 0503  WBC 6.6 10.1  HGB 10.6* 11.0*  HCT 30.8* 32.2*  PLT 187 140*   BMET  Recent Labs  05/22/16 1135 05/23/16 0503  NA 131* 128*  K 3.6 3.4*  CL 98* 97*  CO2 23 22  GLUCOSE 121* 94  BUN <5* <5*  CREATININE 0.68 0.63  CALCIUM 8.2* 7.9*   LFT  Recent Labs  05/23/16 0503  PROT 9.4*  ALBUMIN 2.0*  AST 76*  ALT 23  ALKPHOS 69  BILITOT 3.3*   PT/INR  Recent Labs  05/22/16 1135 05/23/16 0503  LABPROT 19.4* 20.8*  INR 1.63* 1.80*    Studies/Results: US Abdomen Complete  05/22/2016  CLINICAL DATA:  Abdominal swelling. EXAM: ABDOMEN ULTRASOUND COMPLETE COMPARISON:  06/15/2014 FINDINGS: Gallbladder: Gallbladder has a normal appearance. Gallbladder wall is 2.6 mm, within normal limits. No stones or pericholecystic fluid. No sonographic Murphy's sign. Common bile duct:  Diameter: 5.6 mm Liver: Liver contour is nodular. The liver is echogenic. There is attenuation of the ultrasound wave, poor visualization of the internal hepatic architecture, and loss of definition of the diaphragm. No focal liver lesions are identified. IVC: Visualized portion is within normal limits. Pancreas: Pancreas is not well seen because of bowel gas. Spleen: Size and appearance within normal limits. Right Kidney: Length: 12.2 cm. Echogenicity within normal limits. No mass or hydronephrosis visualized. Left Kidney: Length: 12.5 cm. Echogenicity within normal limits. No mass or hydronephrosis visualized. Abdominal aorta: No aneurysm visualized. Other findings: Moderate amount of ascites. IMPRESSION: 1. Ascites. 2. Cirrhotic morphology of the liver. 3. Hepatic steatosis. 4. No evidence for acute cholecystitis. Electronically Signed   By: Nolon Nations M.D.   On: 05/22/2016 12:38   US Paracentesis  05/22/2016  INDICATION: ascites EXAM: ULTRASOUND-GUIDED PARACENTESIS COMPARISON:  None. MEDICATIONS: 10 cc 1% lidocaine COMPLICATIONS: None immediate. TECHNIQUE: Informed written consent was obtained from the patient after a discussion of the risks, benefits and alternatives to treatment. A timeout was performed prior to the initiation of the procedure. Initial ultrasound scanning demonstrates a large amount of ascites within the right lower abdominal quadrant. The right lower abdomen was prepped and draped in the usual sterile fashion. 1% lidocaine with epinephrine was used for local anesthesia. Under direct ultrasound guidance, a 19 gauge, 7-cm, Yueh catheter was introduced. An ultrasound image was saved for documentation purposed. The paracentesis was  performed. The catheter was removed and a dressing was applied. The patient tolerated the procedure well without immediate post procedural complication. FINDINGS: A total of approximately 6 liters of yellow fluid was removed. Samples were sent to the  laboratory as requested by the clinical team. IMPRESSION: Successful ultrasound-guided paracentesis yielding 6 liters of peritoneal fluid. Read by Lavonia [facility] Electronically Signed   By: Marybelle Killings M.D.   On: 05/22/2016 15:26   Dg Abd Acute W/chest  05/22/2016  CLINICAL DATA:  Abdominal swelling for 2 months. History of ascites. EXAM: DG ABDOMEN ACUTE W/ 1V CHEST COMPARISON:  CT abdomen and pelvis 06/15/2014. PA and lateral chest 08/07/2015. FINDINGS: Single-view of the chest demonstrates a small to moderate right pleural effusion. No left effusion is identified. Right basilar atelectasis is noted. The lungs are otherwise clear. Heart size is normal. Heterotopic ossification and degenerative disease about the left shoulder noted. Two views of the abdomen show no free intraperitoneal air. Bowel loops appear centralized compatible with ascites. There is no evidence of bowel obstruction. Postoperative change of upper lumbar fusion noted. IMPRESSION: Findings consistent with abdominal ascites. Small to moderate right pleural effusion and basilar atelectasis. Electronically Signed   By: Inge Rise M.D.   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 Tolerated paracentesis well  Diuretics started #2 BP elevation and tachycardia today -reflecting early ETOH withdrawal  Plan; He does not need to stay in hospital from GI perspective He says he is done with ETOH Needs 2 gram sodium diet  Complete ETOH abstinence  Can increase diuretic to 40 mg lasix qam, and 50 mg aldactone qam He needs follow up with PCP closely - monitor renal function on diuretics Will arrange office follow up GI- if he keeps follow up , will plan EGD to screen for varices outpt Appt made  With Dr Havery Moros  For July 11th at 3:30 pm [facility] / [address])   Active Problems:   Ascites     LOS: 1 day   Danaly Bari   05/23/2016, 2:41 PM

## 2016-05-24 LAB — COMPREHENSIVE METABOLIC PANEL WITH GFR
ALT: 20 U/L (ref 17–63)
AST: 62 U/L — ABNORMAL HIGH (ref 15–41)
Albumin: 1.6 g/dL — ABNORMAL LOW (ref 3.5–5.0)
Alkaline Phosphatase: 58 U/L (ref 38–126)
Anion gap: 5 (ref 5–15)
BUN: <5 mg/dL — ABNORMAL LOW (ref 6–20)
CO2: 23 mmol/L (ref 22–32)
Calcium: 8 mg/dL — ABNORMAL LOW (ref 8.9–10.3)
Chloride: 99 mmol/L — ABNORMAL LOW (ref 101–111)
Creatinine, Ser: 0.58 mg/dL — ABNORMAL LOW (ref 0.61–1.24)
GFR calc Af Amer: >60 mL/min
GFR calc non Af Amer: >60 mL/min
Glucose, Bld: 104 mg/dL — ABNORMAL HIGH (ref 65–99)
Potassium: 3.5 mmol/L (ref 3.5–5.1)
Sodium: 127 mmol/L — ABNORMAL LOW (ref 135–145)
Total Bilirubin: 3.2 mg/dL — ABNORMAL HIGH (ref 0.3–1.2)
Total Protein: 8.3 g/dL — ABNORMAL HIGH (ref 6.5–8.1)

## 2016-05-24 LAB — CBC
HCT: 32.2 % — ABNORMAL LOW (ref 39.0–52.0)
Hemoglobin: 10.6 g/dL — ABNORMAL LOW (ref 13.0–17.0)
MCH: 27.5 pg (ref 26.0–34.0)
MCHC: 32.9 g/dL (ref 30.0–36.0)
MCV: 83.4 fL (ref 78.0–100.0)
Platelets: 142 K/uL — ABNORMAL LOW (ref 150–400)
RBC: 3.86 MIL/uL — ABNORMAL LOW (ref 4.22–5.81)
RDW: 14.5 % (ref 11.5–15.5)
WBC: 7.7 K/uL (ref 4.0–10.5)

## 2016-05-24 LAB — PROTIME-INR
INR: 2.05 — ABNORMAL HIGH (ref 0.00–1.49)
Prothrombin Time: 23 s — ABNORMAL HIGH (ref 11.6–15.2)

## 2016-05-24 LAB — URINE CULTURE

## 2016-05-24 LAB — PATHOLOGIST SMEAR REVIEW

## 2016-05-24 LAB — MAGNESIUM: Magnesium: 1.9 mg/dL (ref 1.7–2.4)

## 2016-05-24 MED ORDER — LORAZEPAM 1 MG PO TABS
1.0000 mg | ORAL_TABLET | ORAL | Status: DC | PRN
  Administered 2016-05-24: 1 mg via ORAL
  Filled 2016-05-24: qty 1

## 2016-05-24 MED ORDER — FUROSEMIDE 40 MG PO TABS: 40.0000 mg | ORAL_TABLET | Freq: Every day | ORAL | Status: DC

## 2016-05-24 MED ORDER — LORAZEPAM 1 MG PO TABS: 1.0000 mg | ORAL_TABLET | ORAL | Status: DC | PRN

## 2016-05-24 NOTE — Discharge Instructions (Signed)
Stephen Carroll,  You were treated for cirrhosis of the liver and had fluid removed from your abdomen. To help keep fluid from building up, please take spironolactone and lasix daily, as prescribed.   Refraining from using alcohol and avoiding OTC pain medicines like acetaminophen (tylenol) and ibuprofen can help prevent progression of disease. You can take the prescribed ativan for symptoms of alcohol withdrawal (shaking, confusion, sweating).   Cirrhosis Cirrhosis is long-term (chronic) liver injury. The liver is your largest internal organ, and it performs many functions. The liver converts food into energy, removes toxic material from your blood, makes important proteins, and absorbs necessary vitamins from your diet. If you have cirrhosis, it means many of your healthy liver cells have been replaced by scar tissue. This prevents blood from flowing through your liver, which makes it difficult for your liver to function. This scarring is not reversible, but treatment can prevent it from getting worse.  CAUSES  Hepatitis C and long-term alcohol abuse are the most common causes of cirrhosis. Other causes include:  Nonalcoholic fatty liver disease.  Hepatitis B infection.  Autoimmune hepatitis.  Diseases that cause blockage of ducts inside the liver.  Inherited liver diseases.  Reactions to certain long-term medicines.  Parasitic infections.  Long-term exposure to certain toxins. RISK FACTORS You may have a higher risk of cirrhosis if you:  Have certain hepatitis viruses.  Abuse alcohol, especially if you are male.  Are overweight.  Share needles.  Have unprotected sex with someone who has hepatitis. SYMPTOMS  You may not have any signs and symptoms at first. Symptoms may not develop until the damage to your liver starts to get worse. Signs and symptoms of cirrhosis may include:   Tenderness in the right-upper part of your abdomen.  Weakness and tiredness (fatigue).  Loss  of appetite.  Nausea.  Weight loss and muscle loss.  Itchiness.  Yellow skin and eyes (jaundice).  Buildup of fluid in the abdomen (ascites).  Swelling of the feet and ankles (edema).  Appearance of tiny blood vessels under the skin.  Mental confusion.  Easy bruising and bleeding. DIAGNOSIS  Your health care provider may suspect cirrhosis based on your symptoms and medical history, especially if you have other medical conditions or a history of alcohol abuse. Your health care provider will do a physical exam to feel your liver and check for signs of cirrhosis. Your health care provider may perform other tests, including:   Blood tests to check:   Whether you have hepatitis B or C.   Kidney function.  Liver function.  Imaging tests such as:  MRI or CT scan to look for changes seen in advanced cirrhosis.  Ultrasound to see if normal liver tissue is being replaced by scar tissue.  A procedure using a long needle to take a sample of liver tissue (biopsy) for examination under a microscope. Liver biopsy can confirm the diagnosis of cirrhosis.  TREATMENT  Treatment depends on how damaged your liver is and what caused the damage. Treatment may include treating cirrhosis symptoms or treating the underlying causes of the condition to try to slow the progression of the damage. Treatment may include:  Making lifestyle changes, such as:   Eating a healthy diet.  Restricting salt intake.  Maintaining a healthy weight.   Not abusing drugs or alcohol.  Taking medicines to:  Treat liver infections or other infections.  Control itching.  Reduce fluid buildup.  Reduce certain blood toxins.  Reduce risk of bleeding from  enlarged blood vessels in the stomach or esophagus (varices).  If varices are causing bleeding problems, you may need treatment with a procedure that ties up the vessels causing them to fall off (band ligation).  If cirrhosis is causing your liver to  fail, your health care provider may recommend a liver transplant.  Other treatments may be recommended depending on any complications of cirrhosis, such as liver-related kidney failure (hepatorenal syndrome). HOME CARE INSTRUCTIONS   Take medicines only as directed by your health care provider. Do not use drugs that are toxic to your liver. Ask your health care provider before taking any new medicines, including over-the-counter medicines.   Rest as needed.  Eat a well-balanced diet. Ask your health care provider or dietitian for more information.   You may have to follow a low-salt diet or restrict your water intake as directed.  Do not drink alcohol. This is especially important if you are taking acetaminophen.  Keep all follow-up visits as directed by your health care provider. This is important. SEEK MEDICAL CARE IF:  You have fatigue or weakness that is getting worse.  You develop swelling of the hands, feet, legs, or face.  You have a fever.  You develop loss of appetite.  You have nausea or vomiting.  You develop jaundice.  You develop easy bruising or bleeding. SEEK IMMEDIATE MEDICAL CARE IF:  You vomit bright red blood or a material that looks like coffee grounds.  You have blood in your stools.  Your stools appear black and tarry.  You become confused.  You have chest pain or trouble breathing.   This information is not intended to replace advice given to you by your health care provider. Make sure you discuss any questions you have with your health care provider.   Document Released: 11/27/2005 Document Revised: 12/18/2014 Document Reviewed: 08/05/2014 Elsevier Interactive Patient Education 2016 [employer].   Alcohol Withdrawal When a person who drinks a lot of alcohol stops drinking, he or she may go through alcohol withdrawal. Alcohol withdrawal causes problems. It can make you feel:  Tired (fatigued).  Sad (depressed).  Fearful  (anxious).  Grouchy (irritable).  Not hungry.  Sick to your stomach (nauseous).  Shaky. It can also make you have:  Nightmares.  Trouble sleeping.  Trouble thinking clearly.  Mood swings.  Clammy skin.  Very bad sweating.  A very fast heartbeat.  Shaking that you cannot control (tremor).  Having a fever.  A fit of movements that you cannot control (seizure).  Confusion.  Throwing up (vomiting).  Feeling or seeing things that are not there (hallucinations). HOME CARE  Take medicines and vitamins only as told by your doctor.  Do not drink alcohol.  Have someone around in case you need help.  Drink enough fluid to keep your pee (urine) clear or pale yellow.  Think about joining a group to help you stop drinking. GET HELP IF:  Your problems get worse.  Your problems do not go away.  You cannot eat or drink without throwing up.  You are having a hard time not drinking alcohol.  You cannot stop drinking alcohol. GET HELP RIGHT AWAY IF:  You feel your heart beating differently than usual.  Your chest hurts.  You have trouble breathing.  You have very bad problems, like:  A fever.  A fit of movements that you cannot control.  Being very confused.  Feeling or seeing things that are not there.   This information is not intended  to replace advice given to you by your health care provider. Make sure you discuss any questions you have with your health care provider.   Document Released: 05/15/2008 Document Revised: 12/18/2014 Document Reviewed: 09/15/2014 Elsevier Interactive Patient Education 2016 [employer].  Alcohol Use Disorder Alcohol use disorder is a mental disorder. It is not a one-time incident of heavy drinking. Alcohol use disorder is the excessive and uncontrollable use of alcohol over time that leads to problems with functioning in one or more areas of daily living. People with this disorder risk harming themselves and others when  they drink to excess. Alcohol use disorder also can cause other mental disorders, such as mood and anxiety disorders, and serious physical problems. People with alcohol use disorder often misuse other drugs.  Alcohol use disorder is common and widespread. Some people with this disorder drink alcohol to cope with or escape from negative life events. Others drink to relieve chronic pain or symptoms of mental illness. People with a family history of alcohol use disorder are at higher risk of losing control and using alcohol to excess.  Drinking too much alcohol can cause injury, accidents, and health problems. One drink can be too much when you are:  Working.  Pregnant or breastfeeding.  Taking medicines. Ask your doctor.  Driving or planning to drive. SYMPTOMS  Signs and symptoms of alcohol use disorder may include the following:   Consumption ofalcohol inlarger amounts or over a longer period of time than intended.  Multiple unsuccessful attempts to cutdown or control alcohol use.   A great deal of time spent obtaining alcohol, using alcohol, or recovering from the effects of alcohol (hangover).  A strong desire or urge to use alcohol (cravings).   Continued use of alcohol despite problems at work, school, or home because of alcohol use.   Continued use of alcohol despite problems in relationships because of alcohol use.  Continued use of alcohol in situations when it is physically hazardous, such as driving a car.  Continued use of alcohol despite awareness of a physical or psychological problem that is likely related to alcohol use. Physical problems related to alcohol use can involve the brain, heart, liver, stomach, and intestines. Psychological problems related to alcohol use include intoxication, depression, anxiety, psychosis, delirium, and dementia.   The need for increased amounts of alcohol to achieve the same desired effect, or a decreased effect from the consumption of  the same amount of alcohol (tolerance).  Withdrawal symptoms upon reducing or stopping alcohol use, or alcohol use to reduce or avoid withdrawal symptoms. Withdrawal symptoms include:  Racing heart.  Hand tremor.  Difficulty sleeping.  Nausea.  Vomiting.  Hallucinations.  Restlessness.  Seizures. DIAGNOSIS Alcohol use disorder is diagnosed through an assessment by your health care provider. Your health care provider may start by asking three or four questions to screen for excessive or problematic alcohol use. To confirm a diagnosis of alcohol use disorder, at least two symptoms must be present within a 68-month period. The severity of alcohol use disorder depends on the number of symptoms:  Mild--two or three.  Moderate--four or five.  Severe--six or more. Your health care provider may perform a physical exam or use results from lab tests to see if you have physical problems resulting from alcohol use. Your health care provider may refer you to a mental health professional for evaluation. TREATMENT  Some people with alcohol use disorder are able to reduce their alcohol use to low-risk levels. Some people with  alcohol use disorder need to quit drinking alcohol. When necessary, mental health professionals with specialized training in substance use treatment can help. Your health care provider can help you decide how severe your alcohol use disorder is and what type of treatment you need. The following forms of treatment are available:   Detoxification. Detoxification involves the use of prescription medicines to prevent alcohol withdrawal symptoms in the first week after quitting. This is important for people with a history of symptoms of withdrawal and for heavy drinkers who are likely to have withdrawal symptoms. Alcohol withdrawal can be dangerous and, in severe cases, cause death. Detoxification is usually provided in a hospital or in-patient substance use treatment  facility.  Counseling or talk therapy. Talk therapy is provided by substance use treatment counselors. It addresses the reasons people use alcohol and ways to keep them from drinking again. The goals of talk therapy are to help people with alcohol use disorder find healthy activities and ways to cope with life stress, to identify and avoid triggers for alcohol use, and to handle cravings, which can cause relapse.  Medicines.Different medicines can help treat alcohol use disorder through the following actions:  Decrease alcohol cravings.  Decrease the positive reward response felt from alcohol use.  Produce an uncomfortable physical reaction when alcohol is used (aversion therapy).  Support groups. Support groups are run by people who have quit drinking. They provide emotional support, advice, and guidance. These forms of treatment are often combined. Some people with alcohol use disorder benefit from intensive combination treatment provided by specialized substance use treatment centers. Both inpatient and outpatient treatment programs are available.   This information is not intended to replace advice given to you by your health care provider. Make sure you discuss any questions you have with your health care provider.   Document Released: 01/04/2005 Document Revised: 12/18/2014 Document Reviewed: 03/06/2013 Elsevier Interactive Patient Education [employer].
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
## 2016-05-24 NOTE — Progress Notes (Signed)
Discharge Note:  Patient is alert and oriented X 2-3 and in no distress.  He mildly anxious as he's in a hurry to leave the hospital.  Patient is unsteady on his feet but makes repeated effort to stand and walk despite frequent redirection from this RN and his significant other.  Patient and significant other given discharge instructions regarding s/s to report, medication, diet, activity and upcoming appointments. They verbalized understanding of all instructions.  Telemetry and peripheral IV discontinued.  Patient transported out via wheelchair by NT to wait for his taxi home. Prescription for Lasix found on printer after patient left.  Patient's sister contacted and stated that patient will be filling prescriptions at [facility] on [address].  Lasix prescription faxed there.

## 2016-05-24 NOTE — Progress Notes (Signed)
CSW consulted regarding ETOH use. Patient states that he does not drink too much (2 beers a day) and does not need resources. Patient's wife also at bedside and disagrees with patient's report. Patient did accept ETOH resources in the event that he changes his mind.  CSW signing off.  Stephen Carroll LCSWA [phone]

## 2016-05-27 LAB — CULTURE, BODY FLUID-BOTTLE

## 2016-05-27 LAB — CULTURE, BODY FLUID W GRAM STAIN -BOTTLE: Culture: NO GROWTH

## 2016-05-29 ENCOUNTER — Ambulatory Visit (INDEPENDENT_AMBULATORY_CARE_PROVIDER_SITE_OTHER): Payer: Medicaid Other | Admitting: Internal Medicine

## 2016-05-29 ENCOUNTER — Encounter: Payer: Self-pay | Admitting: Internal Medicine

## 2016-05-29 VITALS — BP 135/89 | HR 110 | Temp 98.8°F | Ht 68.0 in | Wt 149.0 lb

## 2016-05-29 DIAGNOSIS — I671 Cerebral aneurysm, nonruptured: Secondary | ICD-10-CM

## 2016-05-29 DIAGNOSIS — Z23 Encounter for immunization: Secondary | ICD-10-CM

## 2016-05-29 DIAGNOSIS — K7031 Alcoholic cirrhosis of liver with ascites: Secondary | ICD-10-CM

## 2016-05-29 DIAGNOSIS — F1010 Alcohol abuse, uncomplicated: Secondary | ICD-10-CM

## 2016-05-29 DIAGNOSIS — F1020 Alcohol dependence, uncomplicated: Secondary | ICD-10-CM

## 2016-05-29 DIAGNOSIS — F1021 Alcohol dependence, in remission: Secondary | ICD-10-CM

## 2016-05-29 LAB — POCT INR: INR: 1.4

## 2016-05-29 MED ORDER — SPIRONOLACTONE 100 MG PO TABS: 100.0000 mg | ORAL_TABLET | Freq: Every day | ORAL | Status: DC

## 2016-05-29 MED ORDER — FUROSEMIDE 40 MG PO TABS: 40.0000 mg | ORAL_TABLET | Freq: Every day | ORAL | Status: DC

## 2016-05-29 MED ORDER — THIAMINE HCL 100 MG PO TABS: 100.0000 mg | ORAL_TABLET | Freq: Every day | ORAL | Status: DC

## 2016-05-29 NOTE — Assessment & Plan Note (Signed)
Review of his chart reveals an ED visit  In 2015 that showed a 54mm calcified aneurym of the right MCA, this appeared stable from 2006 but it was recommended he have IR follow up and possible cereberal arteriogram to determine if there is still filling and at risk of rupture.  I discussed this with MR Aikman today and he is willing to follow up with IR. Place referral to IR.

## 2016-05-29 NOTE — Assessment & Plan Note (Addendum)
Reports he has not consumed ETOH since his hospitalization. Will have him continue Spironolactone 100mg  daily and Lasix 40mg  daily Needs HCC screening Q6 months Plan for GI follow up 7/11, needs screening EGD Will give PCV23 and Tdap vaccines today Check Hep A and B serologies to determine need to vaccination. Will repeat CMP and INR.  Last Albumin was 1.6 and INR 2.05

## 2016-05-29 NOTE — Patient Instructions (Addendum)
General Instructions:  I am going to check to see if you need to be vaccinated for Hepatits A and B  We are going to give you the vaccines for Tdap and Pneumococcal disease.  Continue to avoid alcohol. Keep your appointment with GI  I am going to refer you to Radiology to see if you need an study to look at your aneurism.   Thank you for bringing your medicines today. This helps [place] keep you safe from mistakes.   Progress Toward Treatment Goals:  No flowsheet data found.  Self Care Goals & Plans:  No flowsheet data found.  No flowsheet data found.   Care Management & Community Referrals:  No flowsheet data found.

## 2016-05-29 NOTE — Progress Notes (Signed)
[facility] Subjective:   Patient ID: Stephen Carroll male   DOB: 01/10/1966 50 y.o.   MRN: [id number]  HPI: Stephen Carroll is a 50 y.o. male with a PMH detailed below who presents for HFU and to establish care at the [facility]. He was most recently admitted by the [facility] from 6/12-6/14 for abdominal swelling.  And was diagnosed with ascites and cirrhosis.  A paracentesis was completed with removed 6L.  Gastroenterology was consulted and felt that his decompensated cirrhosis was due to his ongoing ETOH abuse.  He has follow up with Dr Havery Moros on 7/11 to have a screening EGD completed. Of note an acute hepatitis panel was negative.  Heb B surface Ab was not checked. He reports that he has been feeling well since he left the hospital.  He has been taking his medication as directed.  He has not consumed any alcohol since leaving the hospital.  He does have some understanding of cirrhosis but does not fully understand what that means.  He knows he needs to stop as his father passed away from alcoholism.   He does not think he has ever been vaccinated for hepatitis a or b.    Past Medical History  Diagnosis Date  . Migraine   . Alcohol use ([place])   . Alcoholic cirrhosis of liver with ascites ([place])   . Alcohol withdrawal seizure ([place])   . Aneurysm, cerebral, nonruptured 08/09/2015   Current Outpatient Prescriptions  Medication Sig Dispense Refill  . furosemide (LASIX) 40 MG tablet Take 1 tablet (40 mg total) by mouth daily. 30 tablet 2  . Multiple Vitamin (MULTIVITAMIN WITH MINERALS) TABS tablet Take 1 tablet by mouth daily. 30 tablet 0  . ondansetron (ZOFRAN ODT) 4 MG disintegrating tablet Take 1 tablet (4 mg total) by mouth every 8 (eight) hours as needed for nausea or vomiting. 20 tablet 0  . spironolactone (ALDACTONE) 100 MG tablet Take 1 tablet (100 mg total) by mouth daily. 30 tablet 2  . thiamine 100 MG tablet Take 1 tablet (100 mg total) by mouth daily. 30 tablet 2    No current facility-administered medications for this visit.   Family History  Problem Relation Age of Onset  . Hypertension Mother   . Alcoholism Father     deceased   Social History   Social History  . Marital Status: Single    Spouse Name: N/A  . Number of Children: N/A  . Years of Education: N/A   Social History Main Topics  . Smoking status: Former Smoker -- 0.00 packs/day    Types: Cigarettes  . Smokeless tobacco: None  . Alcohol Use: Yes     Comment: daily  . Drug Use: Yes    Special: Marijuana  . Sexual Activity: Not Asked   Other Topics Concern  . None   Social History Narrative   Review of Systems: Review of Systems  Constitutional: Negative for fever.  Respiratory: Negative for cough and shortness of breath.   Cardiovascular: Negative for chest pain.  Gastrointestinal: Negative for abdominal pain, diarrhea, blood in stool and melena.  Genitourinary: Negative for dysuria.  Musculoskeletal: Negative for myalgias.  Neurological: Negative for dizziness and headaches.  Psychiatric/Behavioral: Negative for depression.     Objective:  Physical Exam: Filed Vitals:   05/29/16 1428  BP: 135/89  Pulse: 110  Temp: 98.8 F (37.1 C)  TempSrc: Oral  Height: 5\' 8"  (1.727 m)  Weight: 149 lb (67.586 kg)  SpO2: 100%  Physical Exam  Constitutional: He is oriented to person, place, and time and well-developed, well-nourished, and in no distress.  HENT:  Head: Normocephalic and atraumatic.  Eyes: Conjunctivae are normal.  Cardiovascular: Normal heart sounds.  Tachycardia present.   Pulmonary/Chest: Effort normal and breath sounds normal. He has no wheezes.  Abdominal: Soft. Bowel sounds are normal. He exhibits distension (mildly). He exhibits no mass. There is no tenderness.  Musculoskeletal: He exhibits no edema.  Neurological: He is alert and oriented to person, place, and time.  Skin: Skin is warm and dry.  Psychiatric: Affect normal.     Assessment &  Plan:  Case discussed with Dr. Daryll Drown  Alcoholic cirrhosis of liver with ascites ([place]) Reports he has not consumed ETOH since his hospitalization. Will have him continue Spironolactone 100mg  daily and Lasix 40mg  daily Needs HCC screening Q6 months Plan for GI follow up 7/11, needs screening EGD Will give PCV23 and Tdap vaccines today Check Hep A and B serologies to determine need to vaccination. Will repeat CMP and INR.  Last Albumin was 1.6 and INR 2.05  Aneurysm, cerebral, nonruptured Review of his chart reveals an ED visit  In 2015 that showed a 14mm calcified aneurym of the right MCA, this appeared stable from 2006 but it was recommended he have IR follow up and possible cereberal arteriogram to determine if there is still filling and at risk of rupture.  I discussed this with MR Ahuja today and he is willing to follow up with IR. Place referral to IR.    Medications Ordered Meds ordered this encounter  Medications  . spironolactone (ALDACTONE) 100 MG tablet    Sig: Take 1 tablet (100 mg total) by mouth daily.    Dispense:  30 tablet    Refill:  2  . thiamine 100 MG tablet    Sig: Take 1 tablet (100 mg total) by mouth daily.    Dispense:  30 tablet    Refill:  2  . furosemide (LASIX) 40 MG tablet    Sig: Take 1 tablet (40 mg total) by mouth daily.    Dispense:  30 tablet    Refill:  2   Other Orders Orders Placed This Encounter  Procedures  . Hepatitis B Surface Antibody  . Hepatitis A Ab, Total  . CMP14 + Anion Gap  . Ambulatory referral to Interventional Radiology    Referral Priority:  Routine    Referral Type:  Consultation    Referral Reason:  Specialty Services Required    Requested Specialty:  Interventional Radiology    Number of Visits Requested:  1  . POCT INR   Follow Up: Return 1-2 months.

## 2016-05-30 LAB — CMP14 + ANION GAP
A/G RATIO: 0.4 — AB (ref 1.2–2.2)
ALBUMIN: 2.3 g/dL — AB (ref 3.5–5.5)
ALK PHOS: 66 IU/L (ref 39–117)
ALT: 19 IU/L (ref 0–44)
AST: 42 IU/L — ABNORMAL HIGH (ref 0–40)
Anion Gap: 16 mmol/L (ref 10.0–18.0)
BILIRUBIN TOTAL: 1.9 mg/dL — AB (ref 0.0–1.2)
BUN / CREAT RATIO: 7 — AB (ref 9–20)
BUN: 4 mg/dL — ABNORMAL LOW (ref 6–24)
CHLORIDE: 92 mmol/L — AB (ref 96–106)
CO2: 25 mmol/L (ref 18–29)
Calcium: 8.1 mg/dL — ABNORMAL LOW (ref 8.7–10.2)
Creatinine, Ser: 0.6 mg/dL — ABNORMAL LOW (ref 0.76–1.27)
GFR calc Af Amer: 136 mL/min/{1.73_m2} (ref >59)
GFR calc non Af Amer: 118 mL/min/{1.73_m2} (ref >59)
GLOBULIN, TOTAL: 6.3 g/dL — AB (ref 1.5–4.5)
Glucose: 98 mg/dL (ref 65–99)
POTASSIUM: 3.5 mmol/L (ref 3.5–5.2)
SODIUM: 133 mmol/L — AB (ref 134–144)
Total Protein: 8.6 g/dL — ABNORMAL HIGH (ref 6.0–8.5)

## 2016-05-30 LAB — HEPATITIS B SURFACE ANTIBODY,QUALITATIVE: HEP B SURFACE AB, QUAL: NONREACTIVE

## 2016-05-30 LAB — HEPATITIS A ANTIBODY, TOTAL: HEP A TOTAL AB: NEGATIVE

## 2016-05-30 NOTE — Progress Notes (Signed)
Internal Medicine Clinic Attending  Case discussed with Dr. Hoffman at the time of the visit.  We reviewed the resident's history and exam and pertinent patient test results.  I agree with the assessment, diagnosis, and plan of care documented in the resident's note.  

## 2016-06-20 ENCOUNTER — Encounter: Payer: Self-pay | Admitting: Gastroenterology

## 2016-06-20 ENCOUNTER — Other Ambulatory Visit (INDEPENDENT_AMBULATORY_CARE_PROVIDER_SITE_OTHER): Payer: Medicaid Other

## 2016-06-20 ENCOUNTER — Ambulatory Visit (INDEPENDENT_AMBULATORY_CARE_PROVIDER_SITE_OTHER): Payer: Medicaid Other | Admitting: Gastroenterology

## 2016-06-20 VITALS — BP 110/86 | HR 100 | Ht 67.25 in | Wt 161.0 lb

## 2016-06-20 DIAGNOSIS — Z1211 Encounter for screening for malignant neoplasm of colon: Secondary | ICD-10-CM | POA: Diagnosis not present

## 2016-06-20 DIAGNOSIS — K7031 Alcoholic cirrhosis of liver with ascites: Secondary | ICD-10-CM | POA: Diagnosis not present

## 2016-06-20 DIAGNOSIS — R188 Other ascites: Secondary | ICD-10-CM

## 2016-06-20 DIAGNOSIS — F1020 Alcohol dependence, uncomplicated: Secondary | ICD-10-CM | POA: Diagnosis not present

## 2016-06-20 DIAGNOSIS — K7460 Unspecified cirrhosis of liver: Secondary | ICD-10-CM | POA: Diagnosis not present

## 2016-06-20 LAB — COMPREHENSIVE METABOLIC PANEL
ALBUMIN: 2.6 g/dL — AB (ref 3.5–5.2)
ALK PHOS: 74 U/L (ref 39–117)
ALT: 10 U/L (ref 0–53)
AST: 25 U/L (ref 0–37)
BILIRUBIN TOTAL: 1.3 mg/dL — AB (ref 0.2–1.2)
BUN: 6 mg/dL (ref 6–23)
CALCIUM: 8.9 mg/dL (ref 8.4–10.5)
CO2: 27 mEq/L (ref 19–32)
Chloride: 97 mEq/L (ref 96–112)
Creatinine, Ser: 0.67 mg/dL (ref 0.40–1.50)
GFR: 161.97 mL/min (ref >60.00)
GLUCOSE: 104 mg/dL — AB (ref 70–99)
Potassium: 3.9 mEq/L (ref 3.5–5.1)
Sodium: 129 mEq/L — ABNORMAL LOW (ref 135–145)
TOTAL PROTEIN: 9.3 g/dL — AB (ref 6.0–8.3)

## 2016-06-20 LAB — CBC WITH DIFFERENTIAL/PLATELET
BASOS ABS: 0 10*3/uL (ref 0.0–0.1)
Basophils Relative: 0.4 % (ref 0.0–3.0)
Eosinophils Absolute: 0 10*3/uL (ref 0.0–0.7)
Eosinophils Relative: 0.8 % (ref 0.0–5.0)
HEMATOCRIT: 32.4 % — AB (ref 39.0–52.0)
HEMOGLOBIN: 10.7 g/dL — AB (ref 13.0–17.0)
LYMPHS PCT: 28.7 % (ref 12.0–46.0)
Lymphs Abs: 1.8 10*3/uL (ref 0.7–4.0)
MCHC: 33.1 g/dL (ref 30.0–36.0)
MCV: 85.3 fl (ref 78.0–100.0)
MONOS PCT: 10.4 % (ref 3.0–12.0)
Monocytes Absolute: 0.6 10*3/uL (ref 0.1–1.0)
NEUTROS ABS: 3.7 10*3/uL (ref 1.4–7.7)
Neutrophils Relative %: 59.7 % (ref 43.0–77.0)
PLATELETS: 229 10*3/uL (ref 150.0–400.0)
RBC: 3.8 Mil/uL — AB (ref 4.22–5.81)
RDW: 14.1 % (ref 11.5–15.5)
WBC: 6.2 10*3/uL (ref 4.0–10.5)

## 2016-06-20 LAB — IBC PANEL
Iron: 72 ug/dL (ref 42–165)
SATURATION RATIOS: 25.3 % (ref 20.0–50.0)
Transferrin: 203 mg/dL — ABNORMAL LOW (ref 212.0–360.0)

## 2016-06-20 LAB — PROTIME-INR
INR: 1.7 ratio — ABNORMAL HIGH (ref 0.8–1.0)
Prothrombin Time: 17.9 s — ABNORMAL HIGH (ref 9.6–13.1)

## 2016-06-20 MED ORDER — NA SULFATE-K SULFATE-MG SULF 17.5-3.13-1.6 GM/177ML PO SOLN: 1.0000 | Freq: Once | ORAL | Status: DC

## 2016-06-20 NOTE — Patient Instructions (Signed)
You have been scheduled for a colonoscopy. Please follow written instructions given to you at your visit today.  Please pick up your prep supplies at the pharmacy within the next 1-3 days. If you use inhalers (even only as needed), please bring them with you on the day of your procedure. Your physician has requested that you go to www.startemmi.com and enter the access code given to you at your visit today. This web site gives a general overview about your procedure. However, you should still follow specific instructions given to you by our office regarding your preparation for the procedure.  If you are age 49 or older, your body mass index should be between 23-30. Your Body mass index is 25.03 kg/(m^2). If this is out of the aforementioned range listed, please consider follow up with your Primary Care Provider.  If you are age 16 or younger, your body mass index should be between 19-25. Your Body mass index is 25.03 kg/(m^2). If this is out of the aformentioned range listed, please consider follow up with your Primary Care Provider.   Your physician has requested that you go to the basement for lab work before leaving today.  We have sent the following medications to your pharmacy for you to pick up at your convenience: Suprep  We have given you information to read about a low sodium diet.  Thank you for choosing [facility]  Dr Chauncey Cruel. [place]

## 2016-06-20 NOTE — Progress Notes (Signed)
HPI :  INTAKE HISTORY: 50 y.o. male who presented to the emergency room today with complaints of progressive abdominal swelling over the past 1 month. He underwent upper abdominal ultrasound which showed a large amount of ascites and a nodular liver consistent with cirrhosis. Chest x-ray showed a moderate right pleural effusion. Patient has long history of EtOH abuse, he states usually just drinks on the weekends but generally will drink a case a day and has been doing this since she was 19. He is unaware of any prior diagnosis of liver disease. He was hospitalized in August 2016 with an EtOH induced seizure and had alcohol withdrawal. Patient has no complaints of abdominal pain and no nausea or vomiting, denies any peripheral edema. His wife states that he hasn't been eating much but other than that has seemed fine. Labs on admission show EtOH level of 145, drug screen positive for THC, hemoglobin 10.8, albumin 2.2, total bilirubin of 2.7 AST of 93 Pro time is 19.1/INR of 1.6. He also has positive UA ER physician ordered a large volume paracentesis which has already been done full of 6 L. Labs were sent for cell counts etc. He feels fine post paracentesis.  SINCE LAST VISIT:  Patient was admitted for new onset ascites when I first met him, history during intake as above. He was admitted for 2 days and discharged on oral diuretics. He had a large volume paracentesis when admitted which relieved his distension. Thus far tolerating diuretics, he continues to have some ascites but not as "tight". He is taking lasix 67m, aldactone 1010mdaily. He is also taking thiamine and MVI. No edema in his legs. He denies any alcohol intake since prior to the hospitalization. He reports several years worth of heavy drinking. He reports father had cirrhosis from alcohol. He denies any FH of liver cancer. He reports weight is stable. He is eating a low salt diet.   No blood in the stools, no bowel problems. No  prior colonoscopy. No FH of CRC. He has not had a prior upper endoscopy. No NSAID use.   He is not immune to hep A or hep B and had first dose of vaccine series.   Past Medical History  Diagnosis Date  . Migraine   . [place] ([place]  . Alcoholic cirrhosis of liver with ascites ([facility]  . [place] withdrawal seizure ([facility]  . Aneurysm, cerebral, nonruptured 08/09/2015     Past Surgical History  Procedure Laterality Date  . Back surgery      Pt. reports rods placed in back   Family History  Problem Relation Age of Onset  . Hypertension Mother   . Alcoholism Father     deceased   Social History  Substance Use Topics  . Smoking status: Former Smoker -- 0.00 packs/day    Types: Cigarettes  . Smokeless tobacco: Never Used  . Alcohol Use: 0.0 oz/week    0 Standard drinks or equivalent per week     Comment: daily   Current Outpatient Prescriptions  Medication Sig Dispense Refill  . furosemide (LASIX) 40 MG tablet Take 1 tablet (40 mg total) by mouth daily. 30 tablet 2  . Multiple Vitamin (MULTIVITAMIN WITH MINERALS) TABS tablet Take 1 tablet by mouth daily. 30 tablet 0  . ondansetron (ZOFRAN ODT) 4 MG disintegrating tablet Take 1 tablet (4 mg total) by mouth every 8 (eight) hours as needed for nausea or vomiting. 20 tablet 0  . spironolactone (ALDACTONE) 100  MG tablet Take 1 tablet (100 mg total) by mouth daily. 30 tablet 2  . thiamine 100 MG tablet Take 1 tablet (100 mg total) by mouth daily. 30 tablet 2   No current facility-administered medications for this visit.   No Known Allergies   Review of Systems: All systems reviewed and negative except where noted in HPI.    Us Abdomen Complete  05/22/2016  CLINICAL DATA:  Abdominal swelling. EXAM: ABDOMEN ULTRASOUND COMPLETE COMPARISON:  06/15/2014 FINDINGS: Gallbladder: Gallbladder has a normal appearance. Gallbladder wall is 2.6 mm, within normal limits. No stones or pericholecystic fluid. No sonographic Murphy's sign.  Common bile duct: Diameter: 5.6 mm Liver: Liver contour is nodular. The liver is echogenic. There is attenuation of the ultrasound wave, poor visualization of the internal hepatic architecture, and loss of definition of the diaphragm. No focal liver lesions are identified. IVC: Visualized portion is within normal limits. Pancreas: Pancreas is not well seen because of bowel gas. Spleen: Size and appearance within normal limits. Right Kidney: Length: 12.2 cm. Echogenicity within normal limits. No mass or hydronephrosis visualized. Left Kidney: Length: 12.5 cm. Echogenicity within normal limits. No mass or hydronephrosis visualized. Abdominal aorta: No aneurysm visualized. Other findings: Moderate amount of ascites. IMPRESSION: 1. Ascites. 2. Cirrhotic morphology of the liver. 3. Hepatic steatosis. 4. No evidence for acute cholecystitis. Electronically Signed   By: Elizabeth  Brown M.D.   On: 05/22/2016 12:38   Us Paracentesis  05/22/2016  INDICATION: ascites EXAM: ULTRASOUND-GUIDED PARACENTESIS COMPARISON:  None. MEDICATIONS: 10 cc 1% lidocaine COMPLICATIONS: None immediate. TECHNIQUE: Informed written consent was obtained from the patient after a discussion of the risks, benefits and alternatives to treatment. A timeout was performed prior to the initiation of the procedure. Initial ultrasound scanning demonstrates a large amount of ascites within the right lower abdominal quadrant. The right lower abdomen was prepped and draped in the usual sterile fashion. 1% lidocaine with epinephrine was used for local anesthesia. Under direct ultrasound guidance, a 19 gauge, 7-cm, Yueh catheter was introduced. An ultrasound image was saved for documentation purposed. The paracentesis was performed. The catheter was removed and a dressing was applied. The patient tolerated the procedure well without immediate post procedural complication. FINDINGS: A total of approximately 6 liters of yellow fluid was removed. Samples were  sent to the laboratory as requested by the clinical team. IMPRESSION: Successful ultrasound-guided paracentesis yielding 6 liters of peritoneal fluid. Read by Pamela A Turpin PAC Electronically Signed   By: Arthur  Hoss M.D.   On: 05/22/2016 15:26   Dg Abd Acute W/chest  05/22/2016  CLINICAL DATA:  Abdominal swelling for 2 months. History of ascites. EXAM: DG ABDOMEN ACUTE W/ 1V CHEST COMPARISON:  CT abdomen and pelvis 06/15/2014. PA and lateral chest 08/07/2015. FINDINGS: Single-view of the chest demonstrates a small to moderate right pleural effusion. No left effusion is identified. Right basilar atelectasis is noted. The lungs are otherwise clear. Heart size is normal. Heterotopic ossification and degenerative disease about the left shoulder noted. Two views of the abdomen show no free intraperitoneal air. Bowel loops appear centralized compatible with ascites. There is no evidence of bowel obstruction. Postoperative change of upper lumbar fusion noted. IMPRESSION: Findings consistent with abdominal ascites. Small to moderate right pleural effusion and basilar atelectasis. Electronically Signed   By: Thomas  Dalessio M.D.   On: 05/22/2016 12:43   Lab Results  Component Value Date   WBC 6.2 06/20/2016   HGB 10.7* 06/20/2016   HCT 32.4* 06/20/2016     MCV 85.3 06/20/2016   PLT 229.0 06/20/2016    Lab Results  Component Value Date   CREATININE 0.67 06/20/2016   BUN 6 06/20/2016   NA 129* 06/20/2016   K 3.9 06/20/2016   CL 97 06/20/2016   CO2 27 06/20/2016   Lab Results  Component Value Date   ALT 10 06/20/2016   AST 25 06/20/2016   ALKPHOS 74 06/20/2016   BILITOT 1.3* 06/20/2016   Lab Results  Component Value Date   INR 1.7* 06/20/2016   INR 1.4 05/29/2016   INR 2.05* 05/24/2016   CBC Latest Ref Rng 06/20/2016 05/24/2016 05/23/2016  WBC 4.0 - 10.5 K/uL 6.2 7.7 10.1  Hemoglobin 13.0 - 17.0 g/dL 10.7(L) 10.6(L) 11.0(L)  Hematocrit 39.0 - 52.0 % 32.4(L) 32.2(L) 32.2(L)  Platelets  150.0 - 400.0 K/uL 229.0 142(L) 140(L)       Physical Exam: BP 110/86 mmHg  Pulse 100  Ht 5' 7.25" (1.708 m)  Wt 161 lb (73.029 kg)  BMI 25.03 kg/m2 Constitutional: Pleasant,well-developed, male in no acute distress. HEENT: Normocephalic and atraumatic. Conjunctivae are normal. No scleral icterus. Neck supple.  Cardiovascular: mild tachy, regular rhythm.  Pulmonary/chest: Effort normal and breath sounds normal. No wheezing, rales or rhonchi. Abdominal: Soft, (+) ascites, nontender. Bowel sounds active throughout. There are no masses palpable.  Extremities: trace edema Lymphadenopathy: No cervical adenopathy noted. Neurological: Alert and oriented to person place and time. No asterixis Skin: Skin is warm and dry. No rashes noted. Psychiatric: Normal mood and affect. Behavior is normal.   ASSESSMENT AND PLAN: 50 y/o male with alcoholism, who presented with decompensated cirrhosis with ascites. MELD of 13 but improving since he has abstained from alcohol. He is improved from his hospitalization but continues to have ascites on exam. SBP previously ruled out. Tolerating diuretics. His labs were obtained today, Na remains low but stable, renal function and electrolytes stable, okay to increase diuretics at this time. Albumin improving.  I otherwise outlined natural history of cirrhosis with him, risks of decompensation and HCC development. We discussed the critical importance of alcohol abstinence and he has been without a drink since hospitalization and endorses compliance.   At this time recommend the following: - continued alcohol abstinence - complete labs to rule out other forms of chronic liver disease given his age and FH of cirrhosis - increase diuretics to aldactone 260m daily and lasix 469mBID - repeat CMP in one week after change in diuretics - low salt diet - EGD for screening for varices - colonoscopy for colon cancer screening - HCC screening every 6 months, next due  in December - NO NSAIDs  Patient will follow up in 3 months in clinic otherwise, or sooner with questions / concerns.   StCarolina CellarMD [facility] Pager [phone]

## 2016-06-21 ENCOUNTER — Telehealth: Payer: Self-pay | Admitting: Gastroenterology

## 2016-06-21 ENCOUNTER — Other Ambulatory Visit: Payer: Self-pay

## 2016-06-21 DIAGNOSIS — R188 Other ascites: Secondary | ICD-10-CM

## 2016-06-21 LAB — ANTI-NUCLEAR AB-TITER (ANA TITER)

## 2016-06-21 LAB — IGG: IgG (Immunoglobin G), Serum: 4184 mg/dL — ABNORMAL HIGH (ref 694–1618)

## 2016-06-21 LAB — ANA: Anti Nuclear Antibody(ANA): POSITIVE — AB

## 2016-06-21 NOTE — Telephone Encounter (Signed)
Recommend he continue low salt diet, and he should increase his diuretics to aldactone 200mg  ONCE daily, and lasix 40mg  TWICE daily given he still has ascites and hyponatremia.  The pt and his sister have both been notified of the medication directions

## 2016-06-22 ENCOUNTER — Other Ambulatory Visit: Payer: Self-pay

## 2016-06-22 DIAGNOSIS — R188 Other ascites: Secondary | ICD-10-CM

## 2016-06-22 DIAGNOSIS — F1020 Alcohol dependence, uncomplicated: Secondary | ICD-10-CM

## 2016-06-22 DIAGNOSIS — K7469 Other cirrhosis of liver: Secondary | ICD-10-CM

## 2016-06-22 LAB — ANTI-SMOOTH MUSCLE ANTIBODY, IGG: SMOOTH MUSCLE AB: 107 U — AB (ref <20)

## 2016-06-22 LAB — ALPHA-1-ANTITRYPSIN: A-1 Antitrypsin, Ser: 201 mg/dL — ABNORMAL HIGH (ref 83–199)

## 2016-06-22 LAB — CERULOPLASMIN: Ceruloplasmin: 39 mg/dL — ABNORMAL HIGH (ref 18–36)

## 2016-06-27 ENCOUNTER — Other Ambulatory Visit (INDEPENDENT_AMBULATORY_CARE_PROVIDER_SITE_OTHER): Payer: Medicaid Other

## 2016-06-27 ENCOUNTER — Other Ambulatory Visit (HOSPITAL_COMMUNITY): Payer: Self-pay | Admitting: Interventional Radiology

## 2016-06-27 DIAGNOSIS — I729 Aneurysm of unspecified site: Secondary | ICD-10-CM

## 2016-06-27 DIAGNOSIS — R188 Other ascites: Secondary | ICD-10-CM | POA: Diagnosis not present

## 2016-06-27 LAB — BASIC METABOLIC PANEL
BUN: 6 mg/dL (ref 6–23)
CHLORIDE: 97 meq/L (ref 96–112)
CO2: 26 meq/L (ref 19–32)
CREATININE: 0.73 mg/dL (ref 0.40–1.50)
Calcium: 8.9 mg/dL (ref 8.4–10.5)
GFR: 146.7 mL/min (ref >60.00)
GLUCOSE: 99 mg/dL (ref 70–99)
Potassium: 3.7 mEq/L (ref 3.5–5.1)
Sodium: 131 mEq/L — ABNORMAL LOW (ref 135–145)

## 2016-06-28 ENCOUNTER — Other Ambulatory Visit: Payer: Self-pay | Admitting: *Deleted

## 2016-06-28 DIAGNOSIS — F1020 Alcohol dependence, uncomplicated: Secondary | ICD-10-CM

## 2016-06-28 DIAGNOSIS — K7031 Alcoholic cirrhosis of liver with ascites: Secondary | ICD-10-CM

## 2016-07-06 ENCOUNTER — Telehealth: Payer: Self-pay | Admitting: *Deleted

## 2016-07-06 NOTE — Telephone Encounter (Signed)
Spoke with patient and reminded him of appointment for labs.

## 2016-07-06 NOTE — Telephone Encounter (Signed)
-----   Message from Hulan Saas, RN sent at 06/28/2016 10:29 AM EDT ----- Call and remind patient due for BMET for SA on 07/10/16. Lab in.

## 2016-07-10 ENCOUNTER — Other Ambulatory Visit (INDEPENDENT_AMBULATORY_CARE_PROVIDER_SITE_OTHER): Payer: Medicaid Other

## 2016-07-10 DIAGNOSIS — K7031 Alcoholic cirrhosis of liver with ascites: Secondary | ICD-10-CM

## 2016-07-10 DIAGNOSIS — F1020 Alcohol dependence, uncomplicated: Secondary | ICD-10-CM

## 2016-07-10 LAB — BASIC METABOLIC PANEL
BUN: 7 mg/dL (ref 6–23)
CHLORIDE: 97 meq/L (ref 96–112)
CO2: 27 meq/L (ref 19–32)
Calcium: 9.3 mg/dL (ref 8.4–10.5)
Creatinine, Ser: 0.8 mg/dL (ref 0.40–1.50)
GFR: 131.97 mL/min (ref >60.00)
Glucose, Bld: 102 mg/dL — ABNORMAL HIGH (ref 70–99)
POTASSIUM: 4.2 meq/L (ref 3.5–5.1)
Sodium: 131 mEq/L — ABNORMAL LOW (ref 135–145)

## 2016-07-11 ENCOUNTER — Other Ambulatory Visit: Payer: Self-pay | Admitting: *Deleted

## 2016-07-11 ENCOUNTER — Other Ambulatory Visit: Payer: Self-pay | Admitting: General Surgery

## 2016-07-11 DIAGNOSIS — K7031 Alcoholic cirrhosis of liver with ascites: Secondary | ICD-10-CM

## 2016-07-11 MED ORDER — SPIRONOLACTONE 100 MG PO TABS: ORAL_TABLET | ORAL | 2 refills | Status: DC

## 2016-07-11 MED ORDER — FUROSEMIDE 40 MG PO TABS: ORAL_TABLET | ORAL | 2 refills | Status: DC

## 2016-07-12 ENCOUNTER — Other Ambulatory Visit: Payer: Self-pay | Admitting: Radiology

## 2016-07-13 ENCOUNTER — Encounter (HOSPITAL_COMMUNITY): Payer: Self-pay

## 2016-07-13 ENCOUNTER — Other Ambulatory Visit (HOSPITAL_COMMUNITY): Payer: Self-pay | Admitting: Interventional Radiology

## 2016-07-13 ENCOUNTER — Ambulatory Visit (HOSPITAL_COMMUNITY)
Admission: RE | Admit: 2016-07-13 | Discharge: 2016-07-13 | Disposition: A | Discharge status: Home / Self Care | Payer: Medicaid Other | Source: Ambulatory Visit | Attending: Interventional Radiology | Admitting: Interventional Radiology

## 2016-07-13 DIAGNOSIS — G43909 Migraine, unspecified, not intractable, without status migrainosus: Secondary | ICD-10-CM | POA: Diagnosis not present

## 2016-07-13 DIAGNOSIS — Z87891 Personal history of nicotine dependence: Secondary | ICD-10-CM | POA: Insufficient documentation

## 2016-07-13 DIAGNOSIS — R930 Abnormal findings on diagnostic imaging of skull and head, not elsewhere classified: Secondary | ICD-10-CM | POA: Diagnosis not present

## 2016-07-13 DIAGNOSIS — K7031 Alcoholic cirrhosis of liver with ascites: Secondary | ICD-10-CM | POA: Insufficient documentation

## 2016-07-13 DIAGNOSIS — Z8249 Family history of ischemic heart disease and other diseases of the circulatory system: Secondary | ICD-10-CM | POA: Insufficient documentation

## 2016-07-13 DIAGNOSIS — I729 Aneurysm of unspecified site: Secondary | ICD-10-CM

## 2016-07-13 HISTORY — PX: IR GENERIC HISTORICAL: IMG1180011

## 2016-07-13 LAB — PROTIME-INR
INR: 1.48
Prothrombin Time: 18 seconds — ABNORMAL HIGH (ref 11.4–15.2)

## 2016-07-13 LAB — CBC
HEMATOCRIT: 30.5 % — AB (ref 39.0–52.0)
HEMOGLOBIN: 10.2 g/dL — AB (ref 13.0–17.0)
MCH: 26.8 pg (ref 26.0–34.0)
MCHC: 33.4 g/dL (ref 30.0–36.0)
MCV: 80.3 fL (ref 78.0–100.0)
Platelets: 271 10*3/uL (ref 150–400)
RBC: 3.8 MIL/uL — ABNORMAL LOW (ref 4.22–5.81)
RDW: 13.5 % (ref 11.5–15.5)
WBC: 5.7 10*3/uL (ref 4.0–10.5)

## 2016-07-13 LAB — BASIC METABOLIC PANEL
ANION GAP: 6 (ref 5–15)
BUN: 7 mg/dL (ref 6–20)
CO2: 24 mmol/L (ref 22–32)
Calcium: 8.7 mg/dL — ABNORMAL LOW (ref 8.9–10.3)
Chloride: 98 mmol/L — ABNORMAL LOW (ref 101–111)
Creatinine, Ser: 0.72 mg/dL (ref 0.61–1.24)
GFR calc Af Amer: >60 mL/min (ref >60)
GLUCOSE: 99 mg/dL (ref 65–99)
POTASSIUM: 5 mmol/L (ref 3.5–5.1)
Sodium: 128 mmol/L — ABNORMAL LOW (ref 135–145)

## 2016-07-13 LAB — APTT: APTT: 31 s (ref 24–36)

## 2016-07-13 MED ORDER — IOPAMIDOL (ISOVUE-300) INJECTION 61%
INTRAVENOUS | Status: AC
  Filled 2016-07-13: qty 100

## 2016-07-13 MED ORDER — LIDOCAINE HCL 1 % IJ SOLN
INTRAMUSCULAR | Status: AC
  Filled 2016-07-13: qty 20

## 2016-07-13 MED ORDER — HEPARIN SODIUM (PORCINE) 1000 UNIT/ML IJ SOLN
INTRAMUSCULAR | Status: AC | PRN
  Administered 2016-07-13: 1000 [IU] via INTRAVENOUS

## 2016-07-13 MED ORDER — MIDAZOLAM HCL 2 MG/2ML IJ SOLN
INTRAMUSCULAR | Status: AC
  Filled 2016-07-13: qty 2

## 2016-07-13 MED ORDER — MIDAZOLAM HCL 2 MG/2ML IJ SOLN
INTRAMUSCULAR | Status: AC | PRN
  Administered 2016-07-13: 1 mg via INTRAVENOUS

## 2016-07-13 MED ORDER — IOPAMIDOL (ISOVUE-300) INJECTION 61%
INTRAVENOUS | Status: AC
  Filled 2016-07-13: qty 150

## 2016-07-13 MED ORDER — HYDRALAZINE HCL 20 MG/ML IJ SOLN
INTRAMUSCULAR | Status: AC | PRN
  Administered 2016-07-13 (×3): 5 mg via INTRAVENOUS

## 2016-07-13 MED ORDER — FENTANYL CITRATE (PF) 100 MCG/2ML IJ SOLN
INTRAMUSCULAR | Status: AC
  Filled 2016-07-13: qty 2

## 2016-07-13 MED ORDER — SODIUM CHLORIDE 0.9 % IV SOLN
INTRAVENOUS | Status: DC
  Administered 2016-07-13: 08:00:00 via INTRAVENOUS

## 2016-07-13 MED ORDER — HYDRALAZINE HCL 20 MG/ML IJ SOLN
INTRAMUSCULAR | Status: AC
  Filled 2016-07-13: qty 1

## 2016-07-13 MED ORDER — HEPARIN SODIUM (PORCINE) 1000 UNIT/ML IJ SOLN
INTRAMUSCULAR | Status: AC
  Filled 2016-07-13: qty 1

## 2016-07-13 MED ORDER — SODIUM CHLORIDE 0.9 % IV SOLN: INTRAVENOUS | Status: AC

## 2016-07-13 MED ORDER — FENTANYL CITRATE (PF) 100 MCG/2ML IJ SOLN
INTRAMUSCULAR | Status: AC | PRN
  Administered 2016-07-13: 25 ug via INTRAVENOUS
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
## 2016-07-13 NOTE — Procedures (Signed)
S/P 4 vessel cerebral arteiogram. RT CFA approach. Findings. 1.Angiographically no aneurysm or vascular abnormalities noted

## 2016-07-13 NOTE — Sedation Documentation (Signed)
Patient is resting comfortably. 

## 2016-07-13 NOTE — Sedation Documentation (Signed)
IR tech holding pressure to R groin post exoseal placement

## 2016-07-13 NOTE — Discharge Instructions (Signed)
Cerebral Angiogram, Care After °Refer to this sheet in the next few weeks. These instructions provide you with information on caring for yourself after your procedure. Your health care provider may also give you more specific instructions. Your treatment has been planned according to current medical practices, but problems sometimes occur. Call your health care provider if you have any problems or questions after your procedure. °WHAT TO EXPECT AFTER THE PROCEDURE °After your procedure, it is typical to have the following: °· Bruising at the catheter insertion site that usually fades within 1-2 weeks. °· Blood collecting in the tissue (hematoma) that may be painful to the touch. It should usually decrease in size and tenderness within 1-2 weeks. °· A mild headache. °HOME CARE INSTRUCTIONS °· Take medicines only as directed by your health care provider. °· You may shower 24-48 hours after the procedure or as directed by your health care provider. Remove the bandage (dressing) and gently wash the site with plain soap and water. Pat the area dry with a clean towel. Do not rub the site, because this may cause bleeding. °· Do not take baths, swim, or use a hot tub until your health care provider approves. °· Check your insertion site every day for redness, swelling, or drainage. °· Do not apply powder or lotion to the site. °· Do not lift over 10 lb (4.5 kg) for 5 days after your procedure or as directed by your health care provider. °· Ask your health care provider when it is okay to: °¨ Return to work or school. °¨ Resume usual physical activities or sports. °¨ Resume sexual activity. °· Do not drive home if you are discharged the same day as the procedure. Have someone else drive you. °· You may drive 24 hours after the procedure unless otherwise instructed by your health care provider. °· Do not operate machinery or power tools for 24 hours after the procedure or as directed by your health care provider. °· If your  procedure was done as an outpatient procedure, which means that you went home the same day as your procedure, a responsible adult should be with you for the first 24 hours after you arrive home. °· Keep all follow-up visits as directed by your health care provider. This is important. °SEEK MEDICAL CARE IF: °· You have a fever. °· You have chills. °· You have increased bleeding from the catheter insertion site. Hold pressure on the site. °SEEK IMMEDIATE MEDICAL CARE IF: °· You have vision changes or loss of vision. °· You have numbness or weakness on one side of your body. °· You have difficulty talking, or you have slurred speech or cannot speak (aphasia). °· You feel confused or have difficulty remembering. °· You have unusual pain at the catheter insertion site. °· You have redness, warmth, or swelling at the catheter insertion site. °· You have drainage (other than a small amount of blood on the dressing) from the catheter insertion site. °· The catheter insertion site is bleeding, and the bleeding does not stop after 30 minutes of holding steady pressure on the site. °These symptoms may represent a serious problem that is an emergency. Do not wait to see if the symptoms will go away. Get medical help right away. Call your local emergency services (911 in [place] Do not drive yourself to the hospital. °  °This information is not intended to replace advice given to you by your health care provider. Make sure you discuss any questions you have with your   health care provider. °  °Document Released: 04/13/2014 Document Revised: 09/15/2014 Document Reviewed: 04/13/2014 °Elsevier Interactive Patient Education ©2016 [employer] ° °

## 2016-07-13 NOTE — Sedation Documentation (Signed)
IR tech placing exoseal to R groin 

## 2016-07-13 NOTE — Sedation Documentation (Signed)
Family updated as to patient's status.

## 2016-07-14 ENCOUNTER — Other Ambulatory Visit: Payer: Self-pay | Admitting: General Surgery

## 2016-07-17 ENCOUNTER — Ambulatory Visit (HOSPITAL_COMMUNITY)
Admission: RE | Admit: 2016-07-17 | Discharge: 2016-07-17 | Disposition: A | Discharge status: Home / Self Care | Payer: Medicaid Other | Source: Ambulatory Visit | Attending: Gastroenterology | Admitting: Gastroenterology

## 2016-07-17 ENCOUNTER — Other Ambulatory Visit: Payer: Self-pay | Admitting: Gastroenterology

## 2016-07-17 ENCOUNTER — Telehealth: Payer: Self-pay | Admitting: Gastroenterology

## 2016-07-17 ENCOUNTER — Encounter (HOSPITAL_COMMUNITY): Payer: Self-pay

## 2016-07-17 DIAGNOSIS — Z87891 Personal history of nicotine dependence: Secondary | ICD-10-CM | POA: Diagnosis not present

## 2016-07-17 DIAGNOSIS — Z8249 Family history of ischemic heart disease and other diseases of the circulatory system: Secondary | ICD-10-CM | POA: Diagnosis not present

## 2016-07-17 DIAGNOSIS — G43909 Migraine, unspecified, not intractable, without status migrainosus: Secondary | ICD-10-CM | POA: Diagnosis not present

## 2016-07-17 DIAGNOSIS — K7031 Alcoholic cirrhosis of liver with ascites: Secondary | ICD-10-CM

## 2016-07-17 DIAGNOSIS — F1020 Alcohol dependence, uncomplicated: Secondary | ICD-10-CM

## 2016-07-17 DIAGNOSIS — Z8673 Personal history of transient ischemic attack (TIA), and cerebral infarction without residual deficits: Secondary | ICD-10-CM | POA: Insufficient documentation

## 2016-07-17 DIAGNOSIS — K7030 Alcoholic cirrhosis of liver without ascites: Secondary | ICD-10-CM | POA: Diagnosis present

## 2016-07-17 HISTORY — PX: IR GENERIC HISTORICAL: IMG1180011

## 2016-07-17 LAB — CBC
HEMATOCRIT: 29.5 % — AB (ref 39.0–52.0)
HEMOGLOBIN: 10.1 g/dL — AB (ref 13.0–17.0)
MCH: 26.6 pg (ref 26.0–34.0)
MCHC: 34.2 g/dL (ref 30.0–36.0)
MCV: 77.6 fL — ABNORMAL LOW (ref 78.0–100.0)
Platelets: 258 10*3/uL (ref 150–400)
RBC: 3.8 MIL/uL — AB (ref 4.22–5.81)
RDW: 14 % (ref 11.5–15.5)
WBC: 5.3 10*3/uL (ref 4.0–10.5)

## 2016-07-17 LAB — PROTIME-INR
INR: 1.38
PROTHROMBIN TIME: 17 s — AB (ref 11.4–15.2)

## 2016-07-17 LAB — APTT: APTT: 29 s (ref 24–36)

## 2016-07-17 MED ORDER — LIDOCAINE HCL 1 % IJ SOLN
INTRAMUSCULAR | Status: AC
  Filled 2016-07-17: qty 20

## 2016-07-17 MED ORDER — MIDAZOLAM HCL 5 MG/5ML IJ SOLN
INTRAMUSCULAR | Status: AC | PRN
  Administered 2016-07-17: 1 mg via INTRAVENOUS

## 2016-07-17 MED ORDER — LIDOCAINE HCL 1 % IJ SOLN
INTRAMUSCULAR | Status: AC | PRN
  Administered 2016-07-17: 5 mL via INTRADERMAL

## 2016-07-17 MED ORDER — FENTANYL CITRATE (PF) 100 MCG/2ML IJ SOLN
INTRAMUSCULAR | Status: AC | PRN
  Administered 2016-07-17: 50 ug via INTRAVENOUS

## 2016-07-17 MED ORDER — SODIUM CHLORIDE 0.9 % IV SOLN
INTRAVENOUS | Status: DC
  Administered 2016-07-17: 09:00:00 via INTRAVENOUS

## 2016-07-17 MED ORDER — MIDAZOLAM HCL 2 MG/2ML IJ SOLN
INTRAMUSCULAR | Status: AC
  Filled 2016-07-17: qty 6

## 2016-07-17 MED ORDER — FENTANYL CITRATE (PF) 100 MCG/2ML IJ SOLN
INTRAMUSCULAR | Status: AC
  Filled 2016-07-17: qty 4

## 2016-07-17 MED ORDER — IOPAMIDOL (ISOVUE-300) INJECTION 61%
50.0000 mL | Freq: Once | INTRAVENOUS | Status: AC | PRN
  Administered 2016-07-17: 15 mL via INTRAVENOUS

## 2016-07-17 MED ORDER — MIDAZOLAM HCL 2 MG/2ML IJ SOLN
INTRAMUSCULAR | Status: AC | PRN
  Administered 2016-07-17 (×2): 1 mg via INTRAVENOUS

## 2016-07-17 NOTE — H&P (Signed)
Chief Complaint: cirrhosis  Referring Physician:Dr. Brookston Carroll  Supervising Physician: Stephen Carroll  Patient Status:  Out-pt  HPI: Stephen Carroll is an 50 y.o. male who is known to our service as he just underwent a cerebral angiogram last week for evaluation of an aneurysm. The patient is known to have cirrhosis and was admitted to the hospital for several days in early summer with decompensated cirrhosis and ascites. He underwent a paracentesis at that time. He has been followed by Dr. Havery Carroll. He is currently managed on diuretics at home. He does continue to complain of abdominal distention secondary to ascites. A request is been made for a liver biopsy to rule out any other form of chronic liver disease. The patient presents today for this procedure.  Past Medical History:  Past Medical History:  Diagnosis Date  . Alcohol use ([place])   . Alcohol withdrawal seizure ([place])   . Alcoholic cirrhosis of liver with ascites ([place])   . 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 Migraine     Past Surgical History:  Past Surgical History:  Procedure Laterality Date  . BACK SURGERY     Pt. reports rods placed in back    Family History:  Family History  Problem Relation Age of Onset  . Hypertension Mother   . Alcoholism Father     deceased    Social History:  reports that he has quit smoking. His smoking use included Cigarettes. He smoked 0.00 packs per day. He has never used smokeless tobacco. He reports that he drinks alcohol. He reports that he uses drugs, including Marijuana.  Allergies: No Known Allergies  Medications: Medications reviewed in Epic  Please HPI for pertinent positives, otherwise complete 10 system ROS negative.  Mallampati Score: MD Evaluation Airway: WNL Heart: WNL Abdomen: WNL Chest/ Lungs: WNL ASA  Classification: 3 Mallampati/Airway Score: Two  Physical Exam: BP (!) 140/91   Pulse 99   Temp 98.7 F (37.1 C) (Oral)   Resp 14    SpO2 100%  There is no height or weight on file to calculate BMI. General: pleasant, WD, WN black male who is laying in bed in NAD HEENT: head is normocephalic, atraumatic.  Sclera are noninjected.  PERRL.  Ears and nose without any masses or lesions.  Mouth is pink and moist Heart: regular, rate, and rhythm.  Normal s1,s2. No obvious murmurs, gallops, or rubs noted.  Palpable radial and pedal pulses bilaterally Lungs: CTAB, no wheezes, rhonchi, or rales noted.  Respiratory effort nonlabored Abd: soft, but distended with ascites, nontender +BS, no masses, hernias, or organomegaly MS: all 4 extremities are symmetrical with no cyanosis, clubbing, or edema. Psych: A&Ox3 with an appropriate affect.    Labs: Results for orders placed or performed during the hospital encounter of 07/17/16 (from the past 48 hour(s))  APTT upon arrival     Status: None   Collection Time: 07/17/16  8:55 AM  Result Value Ref Range   aPTT 29 24 - 36 seconds  CBC upon arrival     Status: Abnormal   Collection Time: 07/17/16  8:55 AM  Result Value Ref Range   WBC 5.3 4.0 - 10.5 K/uL   RBC 3.80 (L) 4.22 - 5.81 MIL/uL   Hemoglobin 10.1 (L) 13.0 - 17.0 g/dL   HCT 29.5 (L) 39.0 - 52.0 %   MCV 77.6 (L) 78.0 - 100.0 fL   MCH 26.6 26.0 - 34.0 pg   MCHC 34.2 30.0 - 36.0 g/dL  RDW 14.0 11.5 - 15.5 %   Platelets 258 150 - 400 K/uL  Protime-INR upon arrival     Status: Abnormal   Collection Time: 07/17/16  8:55 AM  Result Value Ref Range   Prothrombin Time 17.0 (H) 11.4 - 15.2 seconds   INR 1.38     Imaging: No results found.  Assessment/Plan 1. Elevated liver function tests with cirrhosis -We'll plan to proceed with a transjugular liver biopsy today. The patient does have significant abdominal ascites and therefore the reason why we will not do a percutaneous liver biopsy. His risk for bleeding is higher with a percutaneous liver biopsy secondary to this amount of ascites. -Labs and vitals have been  reviewed. -Risks and Benefits discussed with the patient including, but not limited to bleeding, infection, damage to adjacent structures or low yield requiring additional tests. All of the patient's questions were answered, patient is agreeable to proceed. Consent signed and in chart.   Thank you for this interesting consult.  I greatly enjoyed meeting Stephen Carroll and look forward to participating in their care.  A copy of this report was sent to the requesting provider on this date.  Electronically Signed: Henreitta Carroll 07/17/2016, 9:26 AM   I spent a total of    25 Minutes in face to face in clinical consultation, greater than 50% of which was counseling/coordinating care for cirrhosis, elevated liver function test

## 2016-07-17 NOTE — Telephone Encounter (Signed)
See previous note

## 2016-07-17 NOTE — Progress Notes (Signed)
Last two hr were 109 and 107.  Per orders it states to call IR if hr greater than 100 two consecutive times.  Spoke with Claiborne Billings, [place] and she states she will call [place] and ask him.

## 2016-07-17 NOTE — Discharge Instructions (Signed)
Moderate Conscious Sedation, Adult, Care After Refer to this sheet in the next few weeks. These instructions provide you with information on caring for yourself after your procedure. Your health care provider may also give you more specific instructions. Your treatment has been planned according to current medical practices, but problems sometimes occur. Call your health care provider if you have any problems or questions after your procedure. WHAT TO EXPECT AFTER THE PROCEDURE  After your procedure:  You may feel sleepy, clumsy, and have poor balance for several hours.  Vomiting may occur if you eat too soon after the procedure. HOME CARE INSTRUCTIONS  Do not participate in any activities where you could become injured for at least 24 hours. Do not:  Drive.  Swim.  Ride a bicycle.  Operate heavy machinery.  Cook.  Use power tools.  Climb ladders.  Work from a high place.  Do not make important decisions or sign legal documents until you are improved.  If you vomit, drink water, juice, or soup when you can drink without vomiting. Make sure you have little or no nausea before eating solid foods.  Only take over-the-counter or prescription medicines for pain, discomfort, or fever as directed by your health care provider.  Make sure you and your family fully understand everything about the medicines given to you, including what side effects may occur.  You should not drink alcohol, take sleeping pills, or take medicines that cause drowsiness for at least 24 hours.  If you smoke, do not smoke without supervision.  If you are feeling better, you may resume normal activities 24 hours after you were sedated.  Keep all appointments with your health care provider. SEEK MEDICAL CARE IF:  Your skin is pale or bluish in color.  You continue to feel nauseous or vomit.  Your pain is getting worse and is not helped by medicine.  You have bleeding or swelling.  You are still  sleepy or feeling clumsy after 24 hours. SEEK IMMEDIATE MEDICAL CARE IF:  You develop a rash.  You have difficulty breathing.  You develop any type of allergic problem.  You have a fever. MAKE SURE YOU:  Understand these instructions.  Will watch your condition.  Will get help right away if you are not doing well or get worse.   This information is not intended to replace advice given to you by your health care provider. Make sure you discuss any questions you have with your health care provider.   Document Released: 09/17/2013 Document Revised: 12/18/2014 Document Reviewed: 09/17/2013 Elsevier Interactive Patient Education 2016 [place].   Liver Biopsy, Care After Refer to this sheet in the next few weeks. These instructions provide you with information on caring for yourself after your procedure. Your health care provider may also give you more specific instructions. Your treatment has been planned according to current medical practices, but problems sometimes occur. Call your health care provider if you have any problems or questions after your procedure. WHAT TO EXPECT AFTER THE PROCEDURE After your procedure, it is typical to have the following:  A small amount of discomfort in the area where the biopsy was done and in the right shoulder or shoulder blade.  A small amount of bruising around the area where the biopsy was done and on the skin over the liver.  Sleepiness and fatigue for the rest of the day. HOME CARE INSTRUCTIONS   Rest at home for 1-2 days or as directed by your health care provider.  Have a friend or family member stay with you for at least 24 hours.  Because of the medicines used during the procedure, you should not do the following things in the first 24 hours:  Drive.  Use machinery.  Be responsible for the care of other people.  Sign legal documents.  Take a bath or shower.  There are many different ways to close and cover an incision,  including stitches, skin glue, and adhesive strips. Follow your health care provider's instructions on:  Incision care.  Bandage (dressing) changes and removal.  Incision closure removal.  Do not drink alcohol in the first week.  Do not lift more than 5 pounds or play contact sports for 2 weeks after this test.  Take medicines only as directed by your health care provider. Do not take medicine containing aspirin or non-steroidal anti-inflammatory medicines such as ibuprofen for 1 week after this test.  It is your responsibility to get your test results. SEEK MEDICAL CARE IF:   You have increased bleeding from an incision that results in more than a small spot of blood.  You have redness, swelling, or increasing pain in any incisions.  You notice a discharge or a bad smell coming from any of your incisions.  You have a fever or chills. SEEK IMMEDIATE MEDICAL CARE IF:   You develop swelling, bloating, or pain in your abdomen.  You become dizzy or faint.  You develop a rash.  You are nauseous or vomit.  You have difficulty breathing, feel short of breath, or feel faint.  You develop chest pain.  You have problems with your speech or vision.  You have trouble balancing or moving your arms or legs.   This information is not intended to replace advice given to you by your health care provider. Make sure you discuss any questions you have with your health care provider.   Document Released: 06/16/2005 Document Revised: 12/18/2014 Document Reviewed: 01/23/2014 Elsevier Interactive Patient Education [employer].

## 2016-07-17 NOTE — Telephone Encounter (Signed)
Patient is at [facility] for liver biopsy today. He reports his ankles are swollen. He is asking if he is going to have fluid taken off his abdomen. He does not report SOB at this time. Please, advise.

## 2016-07-17 NOTE — Telephone Encounter (Signed)
He was scheduled for a transjugular liver biopsy today, not paracentesis. He should be taking aldactone 200mg  daily and lasix 40mg  BID. If he is taking this and he continues to have swelling or worsening ascites we may have to adjust and increase his diuretics. If this is the case please let me know. Thanks. BMP recently done and looked okay.

## 2016-07-17 NOTE — Progress Notes (Signed)
Stephen Carroll, [place] called back and states Kathlene Cote says ok for pt to be d/c home.

## 2016-07-17 NOTE — Procedures (Signed)
Interventional Radiology Procedure Note  Procedure:  Transjugular liver biopsy with hepatic venography  Complications:  None  Estimated Blood Loss: < 10 mL  Findings:  Normally patent right and middle hepatic veins.  57 G core biopsy throws x 4 in right lobe via middle hepatic vein yielded 3 intact core samples.  Venetia Night. Kathlene Cote, M.D Pager:  [phone]

## 2016-07-18 NOTE — Telephone Encounter (Signed)
Left message for patient to call back  

## 2016-07-20 ENCOUNTER — Encounter (HOSPITAL_COMMUNITY): Payer: Self-pay | Admitting: Interventional Radiology

## 2016-07-21 ENCOUNTER — Telehealth: Payer: Self-pay | Admitting: *Deleted

## 2016-07-21 NOTE — Telephone Encounter (Signed)
Left a message for patient to call back. 

## 2016-07-21 NOTE — Telephone Encounter (Signed)
Patient given recommendations. He states his edema is less now. He will call for increases.

## 2016-07-21 NOTE — Telephone Encounter (Signed)
Spoke with Maudie Mercury at [facility] path. States Dr. Havery Moros wants path sent out for 2nd opinion. She needs to know where it is to be sent, Attn:?, address or Fed ex number. Please, advise.

## 2016-07-25 NOTE — Telephone Encounter (Signed)
I discussed the case with Dr. Tresa Moore in pathology. She was going to coordinate this, she states she usually sends it to [facility] but I don't know her contact there and who is reading it. Please advise them to discuss this further with Dr. Tresa Moore for these specifics. Thanks

## 2016-07-27 ENCOUNTER — Telehealth: Payer: Self-pay | Admitting: *Deleted

## 2016-07-27 NOTE — Telephone Encounter (Signed)
Line busy - will try again later

## 2016-07-27 NOTE — Telephone Encounter (Signed)
Per Maudie Mercury, she had this taken care of at this time.

## 2016-07-27 NOTE — Telephone Encounter (Signed)
-----   Message from Hulan Saas, RN sent at 07/11/2016  8:29 AM EDT ----- Call and remind patient due for Bmet for SA on 07/31/16. Lab in EPIC.

## 2016-07-27 NOTE — Telephone Encounter (Signed)
Patient notified and he will come for labs. 

## 2016-07-31 ENCOUNTER — Other Ambulatory Visit (INDEPENDENT_AMBULATORY_CARE_PROVIDER_SITE_OTHER): Payer: Medicaid Other

## 2016-07-31 DIAGNOSIS — K7031 Alcoholic cirrhosis of liver with ascites: Secondary | ICD-10-CM | POA: Diagnosis not present

## 2016-07-31 LAB — BASIC METABOLIC PANEL
BUN: 6 mg/dL (ref 6–23)
CALCIUM: 8.8 mg/dL (ref 8.4–10.5)
CHLORIDE: 97 meq/L (ref 96–112)
CO2: 26 meq/L (ref 19–32)
CREATININE: 0.78 mg/dL (ref 0.40–1.50)
GFR: 135.85 mL/min (ref >60.00)
GLUCOSE: 100 mg/dL — AB (ref 70–99)
Potassium: 3.8 mEq/L (ref 3.5–5.1)
Sodium: 131 mEq/L — ABNORMAL LOW (ref 135–145)

## 2016-08-04 ENCOUNTER — Encounter: Payer: Self-pay | Admitting: Gastroenterology

## 2016-08-04 ENCOUNTER — Ambulatory Visit (AMBULATORY_SURGERY_CENTER): Payer: Medicaid Other | Admitting: Gastroenterology

## 2016-08-04 VITALS — BP 120/75 | HR 81 | Temp 96.6°F | Resp 18 | Ht 67.0 in | Wt 161.0 lb

## 2016-08-04 DIAGNOSIS — I8500 Esophageal varices without bleeding: Secondary | ICD-10-CM | POA: Diagnosis not present

## 2016-08-04 DIAGNOSIS — D120 Benign neoplasm of cecum: Secondary | ICD-10-CM

## 2016-08-04 DIAGNOSIS — D122 Benign neoplasm of ascending colon: Secondary | ICD-10-CM

## 2016-08-04 DIAGNOSIS — K7031 Alcoholic cirrhosis of liver with ascites: Secondary | ICD-10-CM

## 2016-08-04 DIAGNOSIS — K319 Disease of stomach and duodenum, unspecified: Secondary | ICD-10-CM | POA: Diagnosis not present

## 2016-08-04 DIAGNOSIS — D123 Benign neoplasm of transverse colon: Secondary | ICD-10-CM

## 2016-08-04 MED ORDER — NADOLOL 40 MG PO TABS: 40.0000 mg | ORAL_TABLET | Freq: Every day | ORAL | 3 refills | Status: DC

## 2016-08-04 MED ORDER — SODIUM CHLORIDE 0.9 % IV SOLN: 500.0000 mL | INTRAVENOUS | Status: DC

## 2016-08-04 NOTE — Progress Notes (Signed)
Gaye Pollack CRNA made aware of pt's pulse

## 2016-08-04 NOTE — Op Note (Signed)
[facility] Patient Name: Stephen Carroll Procedure Date: 08/04/2016 1:23 PM MRN: [id number] Endoscopist: Remo Lipps P. Havery Moros , MD Age: 50 Referring MD:  Date of Birth: Oct 26, 1966 Gender: Male Account #: [id number] Procedure:                Colonoscopy Indications:              Screening for malignant neoplasm in the colon, This                            is the patient's first colonoscopy Medicines:                Monitored Anesthesia Care Procedure:                Pre-Anesthesia Assessment:                           - Prior to the procedure, a History and Physical                            was performed, and patient medications and                            allergies were reviewed. The patient's tolerance of                            previous anesthesia was also reviewed. The risks                            and benefits of the procedure and the sedation                            options and risks were discussed with the patient.                            All questions were answered, and informed consent                            was obtained. Prior Anticoagulants: The patient has                            taken no previous anticoagulant or antiplatelet                            agents. ASA Grade Assessment: III - A patient with                            severe systemic disease. After reviewing the risks                            and benefits, the patient was deemed in                            satisfactory condition to undergo the procedure.  After obtaining informed consent, the colonoscope                            was passed under direct vision. Throughout the                            procedure, the patient's blood pressure, pulse, and                            oxygen saturations were monitored continuously. The                            Model PCF-H190L [id number]) scope was introduced                            through the anus  and advanced to the the cecum,                            identified by appendiceal orifice and ileocecal                            valve. The colonoscopy was performed without                            difficulty. The patient tolerated the procedure                            well. The quality of the bowel preparation was                            good. The ileocecal valve, appendiceal orifice, and                            rectum were photographed. Scope In: 1:45:41 PM Scope Out: 2:14:34 PM Scope Withdrawal Time: 0 hours 22 minutes 7 seconds  Total Procedure Duration: 0 hours 28 minutes 53 seconds  Findings:                 The perianal and digital rectal examinations were                            normal.                           A 3 mm polyp was found in the cecum. The polyp was                            sessile. The polyp was removed with a cold biopsy                            forceps. Resection and retrieval were complete.                           A 5 mm polyp was found in the ascending colon. The  polyp was sessile. The polyp was removed with a                            cold snare. Resection and retrieval were complete.                           A 7 mm polyp was found in the transverse colon. The                            polyp was semi-pedunculated. The polyp was removed                            with a hot snare. Resection and retrieval were                            complete.                           Non-bleeding internal hemorrhoids were found during                            retroflexion. The hemorrhoids were large.                           The splenic flexure was rather difficult to                            insufflate and see the entire lumen due to a                            narrowed / angulated turn. Time was spent in this                            area to evaluate it, no obvious polyp or mass                             lesion noted. The exam was otherwise without                            abnormality. Complications:            No immediate complications. Estimated blood loss:                            Minimal. Estimated Blood Loss:     Estimated blood loss was minimal. Impression:               - One 3 mm polyp in the cecum, removed with a cold                            biopsy forceps. Resected and retrieved.                           - One 5 mm polyp in the ascending colon, removed  with a cold snare. Resected and retrieved.                           - One 7 mm polyp in the transverse colon, removed                            with a hot snare. Resected and retrieved.                           - Non-bleeding internal hemorrhoids.                           - The examination was otherwise normal. Recommendation:           - Patient has a contact number available for                            emergencies. The signs and symptoms of potential                            delayed complications were discussed with the                            patient. Return to normal activities tomorrow.                            Written discharge instructions were provided to the                            patient.                           - Resume previous diet.                           - Continue present medications.                           - No aspirin, ibuprofen, naproxen, or other                            non-steroidal anti-inflammatory drugs for 2 weeks                            after polyp removal.                           - Await pathology results.                           - Repeat colonoscopy is recommended for                            surveillance. The colonoscopy date will be                            determined after  pathology results from today's                            exam become available for review. Remo Lipps P. Havery Moros, MD 08/04/2016 2:23:18 PM This report has  been signed electronically.

## 2016-08-04 NOTE — Progress Notes (Signed)
Called to room to assist during endoscopic procedure.  Patient ID and intended procedure confirmed with present staff. Received instructions for my participation in the procedure from the performing physician.  

## 2016-08-04 NOTE — Patient Instructions (Addendum)
YOU HAD AN ENDOSCOPIC PROCEDURE TODAY AT [facility]:   Refer to the procedure report that was given to you for any specific questions about what was found during the examination.  If the procedure report does not answer your questions, please call your gastroenterologist to clarify.  If you requested that your care partner not be given the details of your procedure findings, then the procedure report has been included in a sealed envelope for you to review at your convenience later.  YOU SHOULD EXPECT: Some feelings of bloating in the abdomen. Passage of more gas than usual.  Walking can help get rid of the air that was put into your GI tract during the procedure and reduce the bloating. If you had a lower endoscopy (such as a colonoscopy or flexible sigmoidoscopy) you may notice spotting of blood in your stool or on the toilet paper. If you underwent a bowel prep for your procedure, you may not have a normal bowel movement for a few days.  Please Note:  You might notice some irritation and congestion in your nose or some drainage.  This is from the oxygen used during your procedure.  There is no need for concern and it should clear up in a day or so.  SYMPTOMS TO REPORT IMMEDIATELY:   Following lower endoscopy (colonoscopy or flexible sigmoidoscopy):  Excessive amounts of blood in the stool  Significant tenderness or worsening of abdominal pains  Swelling of the abdomen that is new, acute  Fever of 100F or higher   Following upper endoscopy (EGD)  Vomiting of blood or coffee ground material  New chest pain or pain under the shoulder blades  Painful or persistently difficult swallowing  New shortness of breath  Fever of 100F or higher  Black, tarry-looking stools  For urgent or emergent issues, a gastroenterologist can be reached at any hour by calling [phone].   DIET:  We do recommend a small meal at first, but then you may proceed to your regular diet.  Drink  plenty of fluids but you should avoid alcoholic beverages for 24 hours.  ACTIVITY:  You should plan to take it easy for the rest of today and you should NOT DRIVE or use heavy machinery until tomorrow (because of the sedation medicines used during the test).    FOLLOW UP: Our staff will call the number listed on your records the next business day following your procedure to check on you and address any questions or concerns that you may have regarding the information given to you following your procedure. If we do not reach you, we will leave a message.  However, if you are feeling well and you are not experiencing any problems, there is no need to return our call.  We will assume that you have returned to your regular daily activities without incident.  If any biopsies were taken you will be contacted by phone or by letter within the next 1-3 weeks.  Please call us at [phone] if you have not heard about the biopsies in 3 weeks.    SIGNATURES/CONFIDENTIALITY: You and/or your care partner have signed paperwork which will be entered into your electronic medical record.  These signatures attest to the fact that that the information above on your After Visit Summary has been reviewed and is understood.  Full responsibility of the confidentiality of this discharge information lies with you and/or your care-partner.YOU HAD AN ENDOSCOPIC PROCEDURE TODAY AT [facility]:  Refer to the procedure report that was given to you for any specific questions about what was found during the examination.  If the procedure report does not answer your questions, please call your gastroenterologist to clarify.  If you requested that your care partner not be given the details of your procedure findings, then the procedure report has been included in a sealed envelope for you to review at your convenience later.  YOU SHOULD EXPECT: Some feelings of bloating in the abdomen. Passage of more gas than  usual.  Walking can help get rid of the air that was put into your GI tract during the procedure and reduce the bloating. If you had a lower endoscopy (such as a colonoscopy or flexible sigmoidoscopy) you may notice spotting of blood in your stool or on the toilet paper. If you underwent a bowel prep for your procedure, you may not have a normal bowel movement for a few days.  Please Note:  You might notice some irritation and congestion in your nose or some drainage.  This is from the oxygen used during your procedure.  There is no need for concern and it should clear up in a day or so.  SYMPTOMS TO REPORT IMMEDIATELY:   Following lower endoscopy (colonoscopy or flexible sigmoidoscopy):  Excessive amounts of blood in the stool  Significant tenderness or worsening of abdominal pains  Swelling of the abdomen that is new, acute  Fever of 100F or higher   Following upper endoscopy (EGD)  Vomiting of blood or coffee ground material  New chest pain or pain under the shoulder blades  Painful or persistently difficult swallowing  New shortness of breath  Fever of 100F or higher  Black, tarry-looking stools  For urgent or emergent issues, a gastroenterologist can be reached at any hour by calling [phone].   DIET:  We do recommend a small meal at first, but then you may proceed to your regular diet.  Drink plenty of fluids but you should avoid alcoholic beverages for 24 hours.  ACTIVITY:  You should plan to take it easy for the rest of today and you should NOT DRIVE or use heavy machinery until tomorrow (because of the sedation medicines used during the test).    FOLLOW UP: Our staff will call the number listed on your records the next business day following your procedure to check on you and address any questions or concerns that you may have regarding the information given to you following your procedure. If we do not reach you, we will leave a message.  However, if you are feeling  well and you are not experiencing any problems, there is no need to return our call.  We will assume that you have returned to your regular daily activities without incident.  If any biopsies were taken you will be contacted by phone or by letter within the next 1-3 weeks.  Please call us at [phone] if you have not heard about the biopsies in 3 weeks.    SIGNATURES/CONFIDENTIALITY: You and/or your care partner have signed paperwork which will be entered into your electronic medical record.  These signatures attest to the fact that that the information above on your After Visit Summary has been reviewed and is understood.  Full responsibility of the confidentiality of this discharge information lies with you and/or your care-partner.  No aspirin, ibuprofen nor aleve for two weeks.  IF you hurt, you may have tylenol.  Take nadolol 40mg  daily as tolerated  per Dr.Armbruster.

## 2016-08-04 NOTE — Op Note (Signed)
[facility] Patient Name: Stephen Carroll Procedure Date: 08/04/2016 1:32 PM MRN: [id number] Endoscopist: Remo Lipps P. Havery Moros , MD Age: 50 Referring MD:  Date of Birth: [birth year] Gender: Male Account #: [id number] Procedure:                Upper GI endoscopy Indications:              Cirrhosis rule out esophageal varices Medicines:                Monitored Anesthesia Care Procedure:                Pre-Anesthesia Assessment:                           - Prior to the procedure, a History and Physical                            was performed, and patient medications and                            allergies were reviewed. The patient's tolerance of                            previous anesthesia was also reviewed. The risks                            and benefits of the procedure and the sedation                            options and risks were discussed with the patient.                            All questions were answered, and informed consent                            was obtained. Prior Anticoagulants: The patient has                            taken no previous anticoagulant or antiplatelet                            agents. ASA Grade Assessment: III - A patient with                            severe systemic disease. After reviewing the risks                            and benefits, the patient was deemed in                            satisfactory condition to undergo the procedure.                           - Prior to the procedure, a History and Physical  was performed, and patient medications and                            allergies were reviewed. The patient's tolerance of                            previous anesthesia was also reviewed. The risks                            and benefits of the procedure and the sedation                            options and risks were discussed with the patient.                            All questions were  answered, and informed consent                            was obtained. Prior Anticoagulants: The patient has                            taken no previous anticoagulant or antiplatelet                            agents. ASA Grade Assessment: III - A patient with                            severe systemic disease. After reviewing the risks                            and benefits, the patient was deemed in                            satisfactory condition to undergo the procedure.                           After obtaining informed consent, the endoscope was                            passed under direct vision. Throughout the                            procedure, the patient's blood pressure, pulse, and                            oxygen saturations were monitored continuously. The                            Model GIF-HQ190 [id number]) scope was introduced                            through the mouth, and advanced to the second part  of duodenum. The upper GI endoscopy was                            accomplished without difficulty. The patient                            tolerated the procedure well. Scope In: Scope Out: Findings:                 Esophagogastric landmarks were identified: the                            Z-line was found at 33 cm, the gastroesophageal                            junction was found at 33 cm and the upper extent of                            the gastric folds was found at 36 cm from the                            incisors.                           A 3 cm hiatal hernia was present.                           Small (< 5 mm) varices were found in the lower                            third of the esophagus / GEJ which flattened with                            insufflation.                           The exam of the esophagus was otherwise normal.                           Diffuse mild inflammation characterized by erythema                             and granularity was found in the entire examined                            stomach. Biopsies were taken with a cold forceps                            for Helicobacter pylori testing.                           The exam of the stomach was otherwise normal.                           The duodenal bulb and second  portion of the                            duodenum were normal. Complications:            No immediate complications. Estimated blood loss:                            Minimal. Estimated Blood Loss:     Estimated blood loss was minimal. Impression:               - Esophagogastric landmarks identified.                           - 3 cm hiatal hernia.                           - Small (< 5 mm) esophageal varices without                            stigmata of bleeding                           - Portal hypertensive gastritis. Biopsied, to rule                            out H pylori.                           - Normal duodenal bulb and second portion of the                            duodenum. Recommendation:           - Patient has a contact number available for                            emergencies. The signs and symptoms of potential                            delayed complications were discussed with the                            patient. Return to normal activities tomorrow.                            Written discharge instructions were provided to the                            patient.                           - Resume previous diet.                           - Continue present medications.                           - Stasrt  nadolol 40mg  once daily as tolerated                           - Await pathology results. Remo Lipps P. Havery Moros, MD 08/04/2016 2:35:22 PM This report has been signed electronically.

## 2016-08-04 NOTE — Progress Notes (Signed)
A and O x3. Report to RN. Tolerated MAC anesthesia well.Teeth unchanged after procedure. 

## 2016-08-07 ENCOUNTER — Telehealth: Payer: Self-pay

## 2016-08-07 NOTE — Telephone Encounter (Signed)
  Follow up Call-  Call back number 08/04/2016  Post procedure Call Back phone  # [phone]  Permission to leave phone message Yes  Some recent data might be hidden     Patient questions:  Do you have a fever, pain , or abdominal swelling? No. Pain Score  0 *  Have you tolerated food without any problems? Yes.    Have you been able to return to your normal activities? Yes.    Do you have any questions about your discharge instructions: Diet   No. Medications  No. Follow up visit  No.  Do you have questions or concerns about your Care? No.  Actions: * If pain score is 4 or above: No action needed, pain <4.

## 2016-08-08 ENCOUNTER — Other Ambulatory Visit: Payer: Self-pay

## 2016-08-08 ENCOUNTER — Telehealth: Payer: Self-pay

## 2016-08-08 MED ORDER — NADOLOL 40 MG PO TABS: 40.0000 mg | ORAL_TABLET | Freq: Every day | ORAL | 6 refills | Status: DC

## 2016-08-08 NOTE — Telephone Encounter (Signed)
Prior auth came via fax from [facility]. After reviewing the chart, it looks like the RX was sent in as 2 x a day and not 1 x a day. Verified with Dr Havery Moros and it should be only 1 a day. Old RX was cancelled and new RX for once a day Nadolol 40 mg  1 day #30 with 6 rfs was sent in to [facility].

## 2016-08-11 ENCOUNTER — Encounter: Payer: Self-pay | Admitting: Gastroenterology

## 2016-08-21 ENCOUNTER — Encounter (HOSPITAL_COMMUNITY): Payer: Self-pay

## 2016-08-30 ENCOUNTER — Telehealth: Payer: Self-pay | Admitting: Gastroenterology

## 2016-08-30 NOTE — Telephone Encounter (Signed)
Left message for patient to call back  

## 2016-08-30 NOTE — Telephone Encounter (Signed)
Gregary Signs,  Can you let this patient know that I received the second opinion from his liver biopsy from [facility], it took a while to be processed. He clearly has cirrhosis but the biopsy is not specific for a clear etiology. 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 Alcohol etiology remains the most likely cause.   Given his labs and liver biopsy results, I wanted to get another opinion from hepatology to ensure he doesn't warrant therapy for autoimmune hepatitis. I would like for him to have an appointment with [place] [facility], for their opinion, they should have access to all of his records. I would like to see him back after that visit. Can you please help coordinate? Thanks

## 2016-09-01 NOTE — Telephone Encounter (Signed)
Left message for patient to call back  

## 2016-09-04 NOTE — Telephone Encounter (Signed)
Line busy

## 2016-09-04 NOTE — Telephone Encounter (Signed)
Patient is returning call from 9/20 best contact number  [phone].

## 2016-09-05 NOTE — Telephone Encounter (Signed)
Patient is notified. Referral with records faxed and confirmed.

## 2016-09-07 ENCOUNTER — Telehealth: Payer: Self-pay

## 2016-09-07 NOTE — Telephone Encounter (Signed)
Received fax from [facility]. Pt has been scheduled for a NP appt for liver care on 10/02/16 @1 :30. They will send office notes after pt is seen. There note can also be seen in the [facility] health system. Their fax is [phone] and phone is [phone].

## 2016-10-06 ENCOUNTER — Other Ambulatory Visit: Payer: Self-pay | Admitting: Gastroenterology

## 2016-10-06 NOTE — Telephone Encounter (Signed)
Can I refill Spironalactone and Furosemide for pt?

## 2016-10-09 NOTE — Telephone Encounter (Signed)
Yes we can refill these, but he is due for BMET if you can coordinate for him. Thanks

## 2016-12-12 ENCOUNTER — Other Ambulatory Visit: Payer: Self-pay | Admitting: Nurse Practitioner

## 2016-12-12 DIAGNOSIS — K7460 Unspecified cirrhosis of liver: Secondary | ICD-10-CM

## 2016-12-15 ENCOUNTER — Other Ambulatory Visit: Payer: Medicaid Other

## 2016-12-18 ENCOUNTER — Emergency Department (HOSPITAL_COMMUNITY)
Admission: EM | Admit: 2016-12-18 | Discharge: 2016-12-18 | Disposition: A | Discharge status: Home / Self Care | Payer: Medicaid Other | Attending: Emergency Medicine | Admitting: Emergency Medicine

## 2016-12-18 ENCOUNTER — Encounter (HOSPITAL_COMMUNITY): Payer: Self-pay | Admitting: Emergency Medicine

## 2016-12-18 DIAGNOSIS — Z87891 Personal history of nicotine dependence: Secondary | ICD-10-CM | POA: Insufficient documentation

## 2016-12-18 DIAGNOSIS — R040 Epistaxis: Secondary | ICD-10-CM | POA: Insufficient documentation

## 2016-12-18 MED ORDER — OXYMETAZOLINE HCL 0.05 % NA SOLN
2.0000 | Freq: Three times a day (TID) | NASAL | 0 refills | Status: DC | PRN
Start: 2016-12-18 — End: 2017-06-29

## 2016-12-18 MED ORDER — OXYMETAZOLINE HCL 0.05 % NA SOLN
1.0000 | Freq: Once | NASAL | Status: AC
  Administered 2016-12-18: 1 via NASAL
  Filled 2016-12-18: qty 15

## 2016-12-18 NOTE — ED Triage Notes (Signed)
Used some med   For stuffy noses and put it up his nose and his nose started to bleed 2 days ago, pt arrives with bleeding controlled and tissue stuck up jhis nose

## 2016-12-18 NOTE — ED Provider Notes (Signed)
[facility] DEPT Provider Note   CSN: [id number] Arrival date & time: 12/18/16  1021     History   Chief Complaint Chief Complaint  Patient presents with  . Epistaxis    HPI Stephen Carroll is a 51 y.o. male.   Epistaxis   This is a new problem. The current episode started 2 days ago. The problem occurs constantly. The problem has been resolved. The bleeding has been from the right nare. He has tried applying pressure and a nasal tampon for the symptoms. The treatment provided significant relief. His past medical history does not include sinus problems, nose-picking or frequent nosebleeds.    Past Medical History:  Diagnosis Date  . Alcohol use   . Alcohol withdrawal seizure ([place])   . Alcoholic cirrhosis of liver with ascites ([place])   . 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 Arthritis   . Migraine   . Seizures [facility])     Patient Active Problem List   Diagnosis Date Noted  . Alcoholic cirrhosis of liver with ascites ([place])   . Alcohol withdrawal seizure ([place])   . 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 Alcohol withdrawal ([place]) 08/07/2015  . Alcohol abuse 08/07/2015    Past Surgical History:  Procedure Laterality Date  . BACK SURGERY     Pt. reports rods placed in back  . IR GENERIC HISTORICAL  07/17/2016   IR VENOGRAM HEPATIC WO HEMODYNAMIC EVALUATION 07/17/2016 Aletta Edouard, MD [facility]  . IR GENERIC HISTORICAL  07/17/2016   IR TRANSCATHETER BX 07/17/2016 Aletta Edouard, MD [facility]  . IR GENERIC HISTORICAL  07/17/2016   IR US GUIDE BX ASP/DRAIN 07/17/2016 Aletta Edouard, MD [facility]  . IR GENERIC HISTORICAL  07/13/2016   IR ANGIO INTRA EXTRACRAN SEL COM CAROTID INNOMINATE BILAT MOD SED 07/13/2016 Luanne Bras, MD [facility]  . IR GENERIC HISTORICAL  07/13/2016   IR ANGIO VERTEBRAL SEL SUBCLAVIAN INNOMINATE BILAT MOD SED 07/13/2016 Luanne Bras, MD [facility]       Home Medications    Prior to Admission medications   Medication  Sig Start Date End Date Taking? Authorizing Provider  furosemide (LASIX) 40 MG tablet TAKE 1 TABLET TWICE A DAY 10/09/16   Manus Gunning, MD  Multiple Vitamin (MULTIVITAMIN WITH MINERALS) TABS tablet Take 1 tablet by mouth daily. 05/23/16   Katheren Shams, DO  nadolol (CORGARD) 40 MG tablet Take 1 tablet (40 mg total) by mouth daily. 08/08/16   Manus Gunning, MD  oxymetazoline [facility] NASAL SPRAY) 0.05 % nasal spray Place 2 sprays into both nostrils 3 (three) times daily as needed (nose bleed). 12/18/16   Merrily Pew, MD  spironolactone (ALDACTONE) 100 MG tablet TAKE 2 TABLETS BY MOUTH DAILY 10/09/16   Manus Gunning, MD  thiamine 100 MG tablet Take 1 tablet (100 mg total) by mouth daily. 05/29/16   Lucious Groves, DO    Family History Family History  Problem Relation Age of Onset  . Hypertension Mother   . Alcoholism Father     deceased  . Colon cancer Neg Hx   . Esophageal cancer Neg Hx   . Stomach cancer Neg Hx   . Rectal cancer Neg Hx     Social History Social History  Substance Use Topics  . Smoking status: Former Smoker    Packs/day: 0.00    Types: Cigarettes  . Smokeless tobacco: Never Used     Comment: occasional cigar use  . Alcohol use 0.0 oz/week  Comment: not drinking currently 08-04-16     Allergies   Patient has no known allergies.   Review of Systems Review of Systems  HENT: Positive for nosebleeds.   All other systems reviewed and are negative.    Physical Exam Updated Vital Signs BP (!) 156/105 (BP Location: Right Arm)   Pulse 99   Temp 98.4 F (36.9 C) (Oral)   Resp 17   SpO2 100%   Physical Exam  Constitutional: He appears well-developed and well-nourished.  HENT:  Head: Normocephalic and atraumatic.  Nose: No nose lacerations, septal deviation or nasal septal hematoma. No epistaxis.  No obvious injury or source of bleeding  Neck: Normal range of motion.  Cardiovascular: Normal rate.   Pulmonary/Chest: Effort  normal. No respiratory distress.  Abdominal: He exhibits no distension.  Musculoskeletal: Normal range of motion.  Neurological: He is alert.  Nursing note and vitals reviewed.    ED Treatments / Results  Labs (all labs ordered are listed, but only abnormal results are displayed) Labs Reviewed - No data to display  EKG  EKG Interpretation None       Radiology No results found.  Procedures Procedures (including critical care time)  Medications Ordered in ED Medications  oxymetazoline (AFRIN) 0.05 % nasal spray 1 spray (1 spray Each Nare Given 12/18/16 1711)     Initial Impression / Assessment and Plan / ED Course  I have reviewed the triage vital signs and the nursing notes.  Pertinent labs & imaging results that were available during my care of the patient were reviewed by me and considered in my medical decision making (see chart for details).  Clinical Course     Epistaxis likely 2/2 dry air and likely trauma from using otc decongestants. No bleeding here. Afrin used. No obvious injury. Stable fro dc.   Final Clinical Impressions(s) / ED Diagnoses   Final diagnoses:  Epistaxis    New Prescriptions Discharge Medication List as of 12/18/2016  6:29 PM    START taking these medications   Details  oxymetazoline (AFRIN NASAL SPRAY) 0.05 % nasal spray Place 2 sprays into both nostrils 3 (three) times daily as needed (nose bleed)., Starting Mon 12/18/2016, Print         Merrily Pew, MD 12/19/16 0000

## 2016-12-19 ENCOUNTER — Ambulatory Visit
Admission: RE | Admit: 2016-12-19 | Discharge: 2016-12-19 | Disposition: A | Discharge status: Home / Self Care | Payer: Medicaid Other | Source: Ambulatory Visit | Attending: Nurse Practitioner | Admitting: Nurse Practitioner

## 2016-12-19 DIAGNOSIS — K7460 Unspecified cirrhosis of liver: Secondary | ICD-10-CM

## 2017-01-09 ENCOUNTER — Other Ambulatory Visit: Payer: Self-pay | Admitting: Gastroenterology

## 2017-01-09 NOTE — Telephone Encounter (Signed)
Are these medications ok to refill?

## 2017-01-10 NOTE — Telephone Encounter (Signed)
Almyra Free I'm okay to refill these medications, but I don't see any labs for him in recent months. Not sure if he had this done by hepatology clinic, but I don't see anything in Care everywhere. Can you please refill these and ask patient to have CMET and AFP level done if he hasn't had it recently. I see he had an US done in January. Thanks much. Caryl Pina is out today so I sent this back to you. Thanks

## 2017-01-11 ENCOUNTER — Other Ambulatory Visit: Payer: Self-pay

## 2017-01-11 DIAGNOSIS — K7031 Alcoholic cirrhosis of liver with ascites: Secondary | ICD-10-CM

## 2017-01-11 MED ORDER — SPIRONOLACTONE 100 MG PO TABS: 200.0000 mg | ORAL_TABLET | Freq: Every day | ORAL | 2 refills | Status: DC

## 2017-01-11 MED ORDER — FUROSEMIDE 40 MG PO TABS: 40.0000 mg | ORAL_TABLET | Freq: Two times a day (BID) | ORAL | 2 refills | Status: DC

## 2017-01-18 ENCOUNTER — Other Ambulatory Visit (INDEPENDENT_AMBULATORY_CARE_PROVIDER_SITE_OTHER): Payer: Medicaid Other

## 2017-01-18 DIAGNOSIS — K7031 Alcoholic cirrhosis of liver with ascites: Secondary | ICD-10-CM

## 2017-01-18 LAB — COMPREHENSIVE METABOLIC PANEL
ALT: 18 U/L (ref 0–53)
AST: 33 U/L (ref 0–37)
Albumin: 4.2 g/dL (ref 3.5–5.2)
Alkaline Phosphatase: 83 U/L (ref 39–117)
BUN: 6 mg/dL (ref 6–23)
CALCIUM: 9.5 mg/dL (ref 8.4–10.5)
CHLORIDE: 104 meq/L (ref 96–112)
CO2: 26 mEq/L (ref 19–32)
Creatinine, Ser: 0.65 mg/dL (ref 0.40–1.50)
GFR: 167.34 mL/min (ref >60.00)
Glucose, Bld: 83 mg/dL (ref 70–99)
POTASSIUM: 4.5 meq/L (ref 3.5–5.1)
Sodium: 135 mEq/L (ref 135–145)
Total Bilirubin: 0.5 mg/dL (ref 0.2–1.2)
Total Protein: 9.3 g/dL — ABNORMAL HIGH (ref 6.0–8.3)

## 2017-01-19 LAB — AFP TUMOR MARKER: AFP TUMOR MARKER: 3.5 ng/mL (ref <6.1)

## 2017-01-24 NOTE — Telephone Encounter (Signed)
Pt informed of results. He will come by office on Monday to sign release and we will request records from [facility] with [facility]

## 2017-01-24 NOTE — Telephone Encounter (Signed)
-----   Message from Manus Gunning, MD sent at 01/23/2017  7:12 PM EST ----- Caryl Pina can you please let this patient know his labs are stable. I think he has been followed by [facility]. I'm not sure when he is supposed to see them or me in follow up, if you can help obtain records from the liver clinic so I can review. Can you please let me know. Thanks

## 2017-04-05 ENCOUNTER — Encounter: Payer: Self-pay | Admitting: Internal Medicine

## 2017-04-05 ENCOUNTER — Ambulatory Visit (INDEPENDENT_AMBULATORY_CARE_PROVIDER_SITE_OTHER): Payer: Medicaid Other | Admitting: Internal Medicine

## 2017-04-05 VITALS — BP 118/82 | HR 88 | Resp 12 | Ht 65.5 in | Wt 147.0 lb

## 2017-04-05 DIAGNOSIS — R252 Cramp and spasm: Secondary | ICD-10-CM | POA: Diagnosis not present

## 2017-04-05 DIAGNOSIS — K7031 Alcoholic cirrhosis of liver with ascites: Secondary | ICD-10-CM

## 2017-04-05 DIAGNOSIS — F1010 Alcohol abuse, uncomplicated: Secondary | ICD-10-CM

## 2017-04-05 NOTE — Patient Instructions (Signed)
If you do not hear from [facility] by Tuesday of next week regarding your meds, please call the clinic.
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
## 2017-04-06 LAB — COMPREHENSIVE METABOLIC PANEL
A/G RATIO: 0.9 — AB (ref 1.2–2.2)
ALBUMIN: 4.5 g/dL (ref 3.5–5.5)
ALT: 42 IU/L (ref 0–44)
AST: 84 IU/L — ABNORMAL HIGH (ref 0–40)
Alkaline Phosphatase: 95 IU/L (ref 39–117)
BUN / CREAT RATIO: 12 (ref 9–20)
BUN: 8 mg/dL (ref 6–24)
Bilirubin Total: 0.6 mg/dL (ref 0.0–1.2)
CO2: 26 mmol/L (ref 18–29)
CREATININE: 0.68 mg/dL — AB (ref 0.76–1.27)
Calcium: 9.4 mg/dL (ref 8.7–10.2)
Chloride: 91 mmol/L — ABNORMAL LOW (ref 96–106)
GFR calc Af Amer: 129 mL/min/{1.73_m2} (ref >59)
GFR, EST NON AFRICAN AMERICAN: 111 mL/min/{1.73_m2} (ref >59)
GLOBULIN, TOTAL: 5.2 g/dL — AB (ref 1.5–4.5)
GLUCOSE: 98 mg/dL (ref 65–99)
POTASSIUM: 4.3 mmol/L (ref 3.5–5.2)
SODIUM: 134 mmol/L (ref 134–144)
Total Protein: 9.7 g/dL — ABNORMAL HIGH (ref 6.0–8.5)

## 2017-04-12 ENCOUNTER — Emergency Department (HOSPITAL_COMMUNITY)
Admission: EM | Admit: 2017-04-12 | Discharge: 2017-04-12 | Disposition: A | Discharge status: Home / Self Care | Payer: Medicaid Other | Attending: Emergency Medicine | Admitting: Emergency Medicine

## 2017-04-12 ENCOUNTER — Encounter (HOSPITAL_COMMUNITY): Payer: Self-pay | Admitting: Emergency Medicine

## 2017-04-12 DIAGNOSIS — M79641 Pain in right hand: Secondary | ICD-10-CM | POA: Diagnosis not present

## 2017-04-12 DIAGNOSIS — Z79899 Other long term (current) drug therapy: Secondary | ICD-10-CM | POA: Insufficient documentation

## 2017-04-12 DIAGNOSIS — Z87891 Personal history of nicotine dependence: Secondary | ICD-10-CM | POA: Diagnosis not present

## 2017-04-12 LAB — BASIC METABOLIC PANEL
Anion gap: 11 (ref 5–15)
BUN: 9 mg/dL (ref 6–20)
CO2: 26 mmol/L (ref 22–32)
Calcium: 9.1 mg/dL (ref 8.9–10.3)
Chloride: 94 mmol/L — ABNORMAL LOW (ref 101–111)
Creatinine, Ser: 0.69 mg/dL (ref 0.61–1.24)
GFR calc Af Amer: >60 mL/min (ref >60)
GFR calc non Af Amer: >60 mL/min (ref >60)
Glucose, Bld: 97 mg/dL (ref 65–99)
Potassium: 3.9 mmol/L (ref 3.5–5.1)
Sodium: 131 mmol/L — ABNORMAL LOW (ref 135–145)

## 2017-04-12 NOTE — ED Notes (Signed)
Pt in restroom at this time.
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
## 2017-04-12 NOTE — ED Provider Notes (Signed)
[facility] DEPT Provider Note   CSN: [id number] Arrival date & time: 04/12/17  0944  By signing my name below, I, Theresia Bough, attest that this documentation has been prepared under the direction and in the presence of [employer], PA-C.  Electronically Signed: Theresia Bough, ED Scribe. 04/12/17. 12:12 PM.  History   Chief Complaint Chief Complaint  Patient presents with  . Hand Pain   The history is provided by the patient. No language interpreter was used.    HPI Comments: Stephen Carroll is a 51 y.o. male who presents to the Emergency Department complaining of gradual worsening, cramping right hand pain that started last week. Per pt, he has been experiencing cramping hand pain over the past week and he notes that his hand will intermittently "lock up" as well. No h/o similar symptoms. He has been squeezing a stress ball, using Aspercreme, and applied a wrap over the area with some relief of his pain. Pt has PMHx of calcium deposits in several of his joints, and he thinks this may be related to his symptoms today. No recent illnesses/infections. Pt denies headache, numbness, tingling, weakness, and confusion.   Past Medical History:  Diagnosis Date  . Alcohol use   . Alcohol withdrawal seizure ([place])   . Alcoholic cirrhosis of liver with ascites ([place])   . 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 Arthritis   . Migraine   . Seizures [facility])    Patient Active Problem List   Diagnosis Date Noted  . Alcoholic cirrhosis of liver with ascites ([place])   . Alcohol withdrawal seizure ([place])   . 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 Alcohol withdrawal ([place]) 08/07/2015  . Alcohol abuse 08/07/2015   Past Surgical History:  Procedure Laterality Date  . BACK SURGERY     Pt. reports rods placed in back  . IR GENERIC HISTORICAL  07/17/2016   IR VENOGRAM HEPATIC WO HEMODYNAMIC EVALUATION 07/17/2016 Aletta Edouard, MD [facility]INTER[facility]  . IR GENERIC HISTORICAL  07/17/2016   IR  TRANSCATHETER BX 07/17/2016 Aletta Edouard, MD [facility]  . IR GENERIC HISTORICAL  07/17/2016   IR US GUIDE BX ASP/DRAIN 07/17/2016 Aletta Edouard, MD [facility]  . IR GENERIC HISTORICAL  07/13/2016   IR ANGIO INTRA EXTRACRAN SEL COM CAROTID INNOMINATE BILAT MOD SED 07/13/2016 Luanne Bras, MD [facility]INTER[facility]  . IR GENERIC HISTORICAL  07/13/2016   IR ANGIO VERTEBRAL SEL SUBCLAVIAN INNOMINATE BILAT MOD SED 07/13/2016 Luanne Bras, MD [facility]    Home Medications    Prior to Admission medications   Medication Sig Start Date End Date Taking? Authorizing Provider  furosemide (LASIX) 40 MG tablet Take 1 tablet (40 mg total) by mouth 2 (two) times daily. 01/11/17   Manus Gunning, MD  Multiple Vitamin (MULTIVITAMIN WITH MINERALS) TABS tablet Take 1 tablet by mouth daily. Patient not taking: Reported on 04/05/2017 05/23/16   Katheren Shams, DO  nadolol (CORGARD) 40 MG tablet Take 1 tablet (40 mg total) by mouth daily. Patient not taking: Reported on 04/05/2017 08/08/16   Manus Gunning, MD  oxymetazoline [facility] NASAL SPRAY) 0.05 % nasal spray Place 2 sprays into both nostrils 3 (three) times daily as needed (nose bleed). Patient not taking: Reported on 04/05/2017 12/18/16   Merrily Pew, MD  spironolactone (ALDACTONE) 100 MG tablet Take 2 tablets (200 mg total) by mouth daily. 01/11/17   Manus Gunning, MD  thiamine 100 MG tablet Take 1 tablet (100 mg total) by mouth daily. Patient not  taking: Reported on 04/05/2017 05/29/16   Lucious Groves, DO   Family History Family History  Problem Relation Age of Onset  . Hypertension Mother   . Alcoholism Father     deceased  . Colon cancer Neg Hx   . Esophageal cancer Neg Hx   . Stomach cancer Neg Hx   . Rectal cancer Neg Hx    Social History Social History  Substance Use Topics  . Smoking status: Former Smoker    Packs/day: 0.00    Types: Cigarettes  . Smokeless tobacco: Never Used     Comment: occasional cigar use  .  Alcohol use 0.0 oz/week     Comment: not drinking currently 08-04-16   Allergies   Patient has no known allergies.  Review of Systems Review of Systems  Musculoskeletal: Positive for arthralgias and myalgias.  Neurological: Negative for weakness, numbness and headaches.  Psychiatric/Behavioral: Negative for confusion.  All other systems reviewed and are negative.  Physical Exam Updated Vital Signs BP 132/89 (BP Location: Left Arm)   Pulse 82   Temp 98.5 F (36.9 C) (Oral)   Resp 16   SpO2 98%   Physical Exam  Constitutional: He is oriented to person, place, and time. He appears well-developed and well-nourished.  HENT:  Head: Normocephalic and atraumatic.  Cardiovascular: Normal rate.   Pulmonary/Chest: Effort normal.  Musculoskeletal:  Right hand: No signs of trauma, swelling or edema. Full active ROM. Sensation intact. Capillarily refill intact. Strength 5/5. Remainder of extremity is atraumatic and non-tender.    Neurological: He is alert and oriented to person, place, and time.  Skin: Skin is warm and dry.  Psychiatric: He has a normal mood and affect.  Nursing note and vitals reviewed.  ED Treatments / Results  DIAGNOSTIC STUDIES: Oxygen Saturation is 100% on RA, normal by my interpretation.   COORDINATION OF CARE: 11:50 AM-Discussed next steps with pt. Pt verbalized understanding and is agreeable with the plan.   Labs (all labs ordered are listed, but only abnormal results are displayed) Labs Reviewed  BASIC METABOLIC PANEL - Abnormal; Notable for the following:       Result Value   Sodium 131 (*)    Chloride 94 (*)    All other components within normal limits    EKG  EKG Interpretation None      Radiology No results found.  Procedures Procedures   Medications Ordered in ED Medications - No data to display  Initial Impression / Assessment and Plan / ED Course  I have reviewed the triage vital signs and the nursing notes.  Pertinent labs &  imaging results that were available during my care of the patient were reviewed by me and considered in my medical decision making (see chart for details).     Final Clinical Impressions(s) / ED Diagnoses   Final diagnoses:  Pain of right hand    51 year old male presents today with complaints of hand pain.  But he is describing sounds like cramps.  Patient is having some discomfort in the hand throughout my evaluation but no obvious cramps or inability to range his hand or fingers.  Patient has no significant abnormalities here that require further evaluation or management.  He is instructed to eat a healthy diet, drink plenty fluids, follow-up with primary care if symptoms persist.  He is given return precautions.  He verbalized understanding and agreement to today's plan. New Prescriptions Discharge Medication List as of 04/12/2017  1:00 PM  I personally performed the services described in this documentation, which was scribed in my presence. The recorded information has been reviewed and is accurate.    Okey Regal, PA-C 04/12/17 1444    Virgel Manifold, MD 04/16/17 [phone]
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
## 2017-04-12 NOTE — Discharge Instructions (Signed)
Please read attached information. If you experience any new or worsening signs or symptoms please return to the emergency room for evaluation. Please follow-up with your primary care provider or specialist as discussed.  °

## 2017-04-16 ENCOUNTER — Other Ambulatory Visit: Payer: Self-pay | Admitting: Gastroenterology

## 2017-04-17 NOTE — Telephone Encounter (Signed)
Are these medications ok to refill. Pt has not been seen since July last year for Alcoholic Cirrhosis with ascites.

## 2017-04-18 NOTE — Telephone Encounter (Signed)
Yes you can refill it but he needs a follow up clinic appointment with me scheduled if you can help coordinate that. Thanks

## 2017-05-16 ENCOUNTER — Emergency Department (HOSPITAL_COMMUNITY): Payer: Medicaid Other

## 2017-05-16 ENCOUNTER — Encounter (HOSPITAL_COMMUNITY): Payer: Self-pay | Admitting: *Deleted

## 2017-05-16 ENCOUNTER — Emergency Department (HOSPITAL_COMMUNITY)
Admission: EM | Admit: 2017-05-16 | Discharge: 2017-05-16 | Disposition: A | Discharge status: Home / Self Care | Payer: Medicaid Other | Attending: Emergency Medicine | Admitting: Emergency Medicine

## 2017-05-16 DIAGNOSIS — Z79899 Other long term (current) drug therapy: Secondary | ICD-10-CM | POA: Diagnosis not present

## 2017-05-16 DIAGNOSIS — Z87891 Personal history of nicotine dependence: Secondary | ICD-10-CM | POA: Diagnosis not present

## 2017-05-16 DIAGNOSIS — R0781 Pleurodynia: Secondary | ICD-10-CM | POA: Insufficient documentation

## 2017-05-16 DIAGNOSIS — M545 Low back pain: Secondary | ICD-10-CM | POA: Diagnosis present

## 2017-05-16 NOTE — Discharge Instructions (Signed)
Please read attached information. If you experience any new or worsening signs or symptoms please return to the emergency room for evaluation. Please follow-up with your primary care provider or specialist as discussed.  °
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
## 2017-05-16 NOTE — ED Notes (Signed)
Patient transported to X-ray 

## 2017-05-17 ENCOUNTER — Emergency Department (HOSPITAL_COMMUNITY)
Admission: EM | Admit: 2017-05-17 | Discharge: 2017-05-17 | Disposition: A | Discharge status: Home / Self Care | Payer: Medicaid Other | Attending: Emergency Medicine | Admitting: Emergency Medicine

## 2017-05-17 ENCOUNTER — Encounter (HOSPITAL_COMMUNITY): Payer: Self-pay | Admitting: Emergency Medicine

## 2017-05-17 ENCOUNTER — Ambulatory Visit: Payer: Medicaid Other | Admitting: Student

## 2017-05-17 ENCOUNTER — Emergency Department (HOSPITAL_COMMUNITY): Payer: Medicaid Other

## 2017-05-17 DIAGNOSIS — Y92512 Supermarket, store or market as the place of occurrence of the external cause: Secondary | ICD-10-CM | POA: Insufficient documentation

## 2017-05-17 DIAGNOSIS — Y939 Activity, unspecified: Secondary | ICD-10-CM | POA: Diagnosis not present

## 2017-05-17 DIAGNOSIS — S3991XA Unspecified injury of abdomen, initial encounter: Secondary | ICD-10-CM | POA: Diagnosis present

## 2017-05-17 DIAGNOSIS — S301XXA Contusion of abdominal wall, initial encounter: Secondary | ICD-10-CM | POA: Diagnosis not present

## 2017-05-17 DIAGNOSIS — M545 Low back pain, unspecified: Secondary | ICD-10-CM

## 2017-05-17 DIAGNOSIS — Y999 Unspecified external cause status: Secondary | ICD-10-CM | POA: Insufficient documentation

## 2017-05-17 DIAGNOSIS — R109 Unspecified abdominal pain: Secondary | ICD-10-CM

## 2017-05-17 DIAGNOSIS — Z87891 Personal history of nicotine dependence: Secondary | ICD-10-CM | POA: Diagnosis not present

## 2017-05-17 LAB — CBC WITH DIFFERENTIAL/PLATELET
Basophils Absolute: 0 10*3/uL (ref 0.0–0.1)
Basophils Relative: 1 %
EOS PCT: 0 %
Eosinophils Absolute: 0 10*3/uL (ref 0.0–0.7)
HCT: 34.3 % — ABNORMAL LOW (ref 39.0–52.0)
Hemoglobin: 11.5 g/dL — ABNORMAL LOW (ref 13.0–17.0)
LYMPHS ABS: 1 10*3/uL (ref 0.7–4.0)
Lymphocytes Relative: 23 %
MCH: 28.4 pg (ref 26.0–34.0)
MCHC: 33.5 g/dL (ref 30.0–36.0)
MCV: 84.7 fL (ref 78.0–100.0)
Monocytes Absolute: 0.3 10*3/uL (ref 0.1–1.0)
Monocytes Relative: 8 %
Neutro Abs: 2.9 10*3/uL (ref 1.7–7.7)
Neutrophils Relative %: 68 %
PLATELETS: 197 10*3/uL (ref 150–400)
RBC: 4.05 MIL/uL — AB (ref 4.22–5.81)
RDW: 14.1 % (ref 11.5–15.5)
WBC: 4.2 10*3/uL (ref 4.0–10.5)

## 2017-05-17 LAB — COMPREHENSIVE METABOLIC PANEL
ALT: 35 U/L (ref 17–63)
AST: 65 U/L — ABNORMAL HIGH (ref 15–41)
Albumin: 4.3 g/dL (ref 3.5–5.0)
Alkaline Phosphatase: 68 U/L (ref 38–126)
Anion gap: 12 (ref 5–15)
BUN: 7 mg/dL (ref 6–20)
CHLORIDE: 94 mmol/L — AB (ref 101–111)
CO2: 25 mmol/L (ref 22–32)
Calcium: 9.6 mg/dL (ref 8.9–10.3)
Creatinine, Ser: 0.64 mg/dL (ref 0.61–1.24)
GFR calc non Af Amer: >60 mL/min (ref >60)
Glucose, Bld: 120 mg/dL — ABNORMAL HIGH (ref 65–99)
Potassium: 4.1 mmol/L (ref 3.5–5.1)
SODIUM: 131 mmol/L — AB (ref 135–145)
Total Bilirubin: 1.1 mg/dL (ref 0.3–1.2)
Total Protein: 10.2 g/dL — ABNORMAL HIGH (ref 6.5–8.1)

## 2017-05-17 MED ORDER — METHOCARBAMOL 500 MG PO TABS: 500.0000 mg | ORAL_TABLET | Freq: Two times a day (BID) | ORAL | 0 refills | Status: DC

## 2017-05-17 MED ORDER — IBUPROFEN 800 MG PO TABS: 800.0000 mg | ORAL_TABLET | Freq: Three times a day (TID) | ORAL | 0 refills | Status: DC

## 2017-05-17 MED ORDER — IOPAMIDOL (ISOVUE-300) INJECTION 61%
INTRAVENOUS | Status: AC
  Administered 2017-05-17: 100 mL
  Filled 2017-05-17: qty 100

## 2017-05-17 NOTE — ED Notes (Signed)
Patient ambulating back and forth to restroom without difficulty
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
## 2017-05-17 NOTE — Discharge Instructions (Signed)
Return if any problems. See your Physician for recheck if pain persist  
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
## 2017-05-17 NOTE — ED Notes (Signed)
See EDP secondary assessment.  

## 2017-05-22 ENCOUNTER — Encounter: Payer: Self-pay | Admitting: Family Medicine

## 2017-05-22 ENCOUNTER — Ambulatory Visit (INDEPENDENT_AMBULATORY_CARE_PROVIDER_SITE_OTHER): Payer: Medicaid Other | Admitting: Family Medicine

## 2017-05-22 DIAGNOSIS — S20212D Contusion of left front wall of thorax, subsequent encounter: Secondary | ICD-10-CM

## 2017-05-22 NOTE — Assessment & Plan Note (Signed)
Recently with negative imaging Exam benign, though patient does flinch in pain before I touch him He is requesting have your pain medication Advised him to take the Robaxin and ibuprofen that was prescribed to him by the emergency department He is not an [facility] patient had advised him to follow-up with his primary care doctor Dr. Amil Amen who he has established with previously

## 2017-05-22 NOTE — Patient Instructions (Signed)
Rib Contusion A rib contusion is a deep bruise on your rib area. Contusions are the result of a blunt trauma that causes bleeding and injury to the tissues under the skin. A rib contusion may involve bruising of the ribs and of the skin and muscles in the area. The skin overlying the contusion may turn blue, purple, or yellow. Minor injuries will give you a painless contusion, but more severe contusions may stay painful and swollen for a few weeks. What are the causes? A contusion is usually caused by a blow, trauma, or direct force to an area of the body. This often occurs while playing contact sports. What are the signs or symptoms?  Swelling and redness of the injured area.  Discoloration of the injured area.  Tenderness and soreness of the injured area.  Pain with or without movement. How is this diagnosed? The diagnosis can be made by taking a medical history and performing a physical exam. An X-ray, CT scan, or MRI may be needed to determine if there were any associated injuries, such as broken bones (fractures) or internal injuries. How is this treated? Often, the best treatment for a rib contusion is rest. Icing or applying cold compresses to the injured area may help reduce swelling and inflammation. Deep breathing exercises may be recommended to reduce the risk of partial lung collapse and pneumonia. Over-the-counter or prescription medicines may also be recommended for pain control. Follow these instructions at home:  Apply ice to the injured area: ? Put ice in a plastic bag. ? Place a towel between your skin and the bag. ? Leave the ice on for 20 minutes, 2-3 times per day.  Take medicines only as directed by your health care provider.  Rest the injured area. Avoid strenuous activity and any activities or movements that cause pain. Be careful during activities and avoid bumping the injured area.  Perform deep-breathing exercises as directed by your health care provider.  Do  not lift anything that is heavier than 5 lb (2.3 kg) until your health care provider approves.  Do not use any tobacco products, including cigarettes, chewing tobacco, or electronic cigarettes. If you need help quitting, ask your health care provider. Contact a health care provider if:  You have increased bruising or swelling.  You have pain that is not controlled with treatment.  You have a fever. Get help right away if:  You have difficulty breathing or shortness of breath.  You develop a continual cough, or you cough up thick or bloody sputum.  You feel sick to your stomach (nauseous), you throw up (vomit), or you have abdominal pain. This information is not intended to replace advice given to you by your health care provider. Make sure you discuss any questions you have with your health care provider. Document Released: 08/22/2001 Document Revised: 05/04/2016 Document Reviewed: 09/08/2014 Elsevier Interactive Patient Education  2018 Elsevier Inc.  
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
## 2017-05-28 ENCOUNTER — Telehealth: Payer: Self-pay | Admitting: *Deleted
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
## 2017-05-29 ENCOUNTER — Encounter: Payer: Self-pay | Admitting: Family Medicine

## 2017-05-29 ENCOUNTER — Ambulatory Visit (INDEPENDENT_AMBULATORY_CARE_PROVIDER_SITE_OTHER): Payer: Medicaid Other | Admitting: Family Medicine

## 2017-05-29 VITALS — BP 120/76 | HR 92 | Temp 98.3°F | Ht 68.0 in | Wt 143.0 lb

## 2017-05-29 DIAGNOSIS — S20212D Contusion of left front wall of thorax, subsequent encounter: Secondary | ICD-10-CM

## 2017-05-29 DIAGNOSIS — M545 Low back pain, unspecified: Secondary | ICD-10-CM | POA: Insufficient documentation

## 2017-05-29 NOTE — Assessment & Plan Note (Signed)
Recently with negative imaging Exam benign, though patient does flinch and pain to even the lightest touch Advised him it is okay to take ibuprofen even with cirrhosis In order to be seen again in our clinic, he will need to undergo new patient paperwork and join the waiting list and have a new patient appointment

## 2017-05-29 NOTE — Patient Instructions (Signed)

## 2017-05-29 NOTE — Assessment & Plan Note (Signed)
No red flags on exam No areas of point tenderness, though he does flinch to even the lightest touch diffusely over his entire back No radicular symptoms Recently with negative CT abdomen pelvis Will get x-ray of lumbar spine to evaluate his hardware
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
## 2017-06-11 ENCOUNTER — Ambulatory Visit: Payer: Medicaid Other | Admitting: Internal Medicine

## 2017-06-29 ENCOUNTER — Encounter: Payer: Self-pay | Admitting: Family Medicine

## 2017-06-29 ENCOUNTER — Ambulatory Visit (INDEPENDENT_AMBULATORY_CARE_PROVIDER_SITE_OTHER): Payer: Medicaid Other | Admitting: Family Medicine

## 2017-06-29 VITALS — BP 108/68 | HR 89 | Temp 98.1°F | Ht 68.0 in | Wt 149.0 lb

## 2017-06-29 DIAGNOSIS — G8929 Other chronic pain: Secondary | ICD-10-CM | POA: Diagnosis not present

## 2017-06-29 DIAGNOSIS — F1290 Cannabis use, unspecified, uncomplicated: Secondary | ICD-10-CM

## 2017-06-29 DIAGNOSIS — M545 Low back pain: Secondary | ICD-10-CM | POA: Diagnosis not present

## 2017-06-29 DIAGNOSIS — F1010 Alcohol abuse, uncomplicated: Secondary | ICD-10-CM | POA: Diagnosis not present

## 2017-06-29 DIAGNOSIS — Z720 Tobacco use: Secondary | ICD-10-CM | POA: Diagnosis not present

## 2017-06-29 DIAGNOSIS — K7031 Alcoholic cirrhosis of liver with ascites: Secondary | ICD-10-CM
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
## 2017-06-29 NOTE — Patient Instructions (Addendum)
It was good to meet you today.  Keep up the good work with cutting down on your alcohol, that's wonderful what you are doing for your health.  Things to do to keep yourself healthy  - Exercise at least 30-45 minutes a day, 3-4 days a week.  - Eat a low-fat diet with lots of fruits and vegetables, up to 7-9 servings per day.  - Seatbelts can save your life. Wear them always.  - Smoke detectors on every level of your home, check batteries every year.  - Eye Doctor - have an eye exam every 1-2 years  - Safe sex - if you may be exposed to STDs, use a condom.  - Alcohol -  If you drink, do it moderately, less than 2 drinks per day.  - [place]. Choose someone to speak for you if you are not able.  - Depression is common in our stressful world.If you're feeling down or losing interest in things you normally enjoy, please come in for a visit.  - Violence - If anyone is threatening or hurting you, please call immediately.   I will see you back in 1 month to talk about your joint pains.   Take care,  Dr. Bufford Lope, [place]

## 2017-07-01 ENCOUNTER — Encounter: Payer: Self-pay | Admitting: Family Medicine

## 2017-07-01 DIAGNOSIS — F1290 Cannabis use, unspecified, uncomplicated: Secondary | ICD-10-CM | POA: Insufficient documentation

## 2017-07-01 DIAGNOSIS — Z720 Tobacco use: Secondary | ICD-10-CM | POA: Insufficient documentation

## 2017-07-01 NOTE — Assessment & Plan Note (Signed)
Chronic issue without acute complaints. No red flags on history or exam - continue prn tylenol.

## 2017-07-01 NOTE — Assessment & Plan Note (Signed)
Continue lasix 40mg  qd and spironolactone 100mg  qd. Has GI follow up on 12/17/2017

## 2017-07-01 NOTE — Assessment & Plan Note (Signed)
Discussed quitting cigar use given his medical history, he is unwilling to give up his cigars at this time.

## 2017-07-01 NOTE — Assessment & Plan Note (Signed)
Discussed quitting recreational drug use. Patient is very resistant at this time.
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
## 2017-07-24 ENCOUNTER — Ambulatory Visit (INDEPENDENT_AMBULATORY_CARE_PROVIDER_SITE_OTHER): Payer: Medicaid Other | Admitting: Internal Medicine

## 2017-07-24 ENCOUNTER — Encounter: Payer: Self-pay | Admitting: Internal Medicine

## 2017-07-24 VITALS — BP 136/80 | HR 105 | Temp 99.7°F | Ht 68.0 in | Wt 146.0 lb

## 2017-07-24 DIAGNOSIS — B029 Zoster without complications: Secondary | ICD-10-CM | POA: Diagnosis not present

## 2017-07-24 MED ORDER — VALACYCLOVIR HCL 1 G PO TABS: 1000.0000 mg | ORAL_TABLET | Freq: Three times a day (TID) | ORAL | 0 refills | Status: DC

## 2017-07-24 NOTE — Progress Notes (Signed)
   [facility] Kerrin Mo, MD Phone: [phone]  Reason For Visit: SDA for Skin Rash  # Woke up in the morning with a rash specifically on the right side of the neck.Patient states it itchy and burning sensation. No new medication. No exposure outside/ Denies any rash anywhere   No fevers, chills, nausea or vomiting, no SOB  Past Medical History Reviewed problem list.  Medications- reviewed and updated No additions to family history Social history- patient is a smoker  Objective: BP 136/80   Pulse (!) 105   Temp 99.7 F (37.6 C) (Oral)   Ht 5\' 8"  (1.727 m)   Wt 146 lb (66.2 kg)   SpO2 98%   BMI 22.20 kg/m  Gen: NAD, alert, cooperative with exam Pulm: clear to auscultation bilaterally, no wheezes, rhonchi or rales GI: soft, non-tender, non-distended, bowel sounds present, no hepatomegaly, no splenomegaly Skin: Vesicular rash noted on right neck and shoulder, no crossing of the midline, burning pain with palpation  Assessment/Plan: See problem based a/p  Herpes zoster without complication Pathognomonic for Herpes zoster, not crossing midline, follows along dermatome, vesicular rash. Other thoughts include contact dermatitis - however no hx  - valACYclovir (VALTREX) 1000 MG tablet; Take 1 tablet (1,000 mg total) by mouth 3 (three) times daily.  Dispense: 21 tablet; Refill: 0 - Follow up in two weeks

## 2017-07-24 NOTE — Assessment & Plan Note (Addendum)
Pathognomonic for Herpes zoster, not crossing midline, follows along dermatome, vesicular rash. Other thoughts include contact dermatitis - however no hx  - valACYclovir (VALTREX) 1000 MG tablet; Take 1 tablet (1,000 mg total) by mouth 3 (three) times daily.  Dispense: 21 tablet; Refill: 0 - Follow up in two weeks

## 2017-07-24 NOTE — Patient Instructions (Signed)

## 2017-07-30 ENCOUNTER — Ambulatory Visit: Payer: Medicaid Other | Admitting: Family Medicine

## 2017-08-08 ENCOUNTER — Encounter: Payer: Self-pay | Admitting: Family Medicine

## 2017-08-08 ENCOUNTER — Ambulatory Visit (INDEPENDENT_AMBULATORY_CARE_PROVIDER_SITE_OTHER): Payer: Medicaid Other | Admitting: Family Medicine

## 2017-08-08 DIAGNOSIS — F1290 Cannabis use, unspecified, uncomplicated: Secondary | ICD-10-CM | POA: Diagnosis not present

## 2017-08-08 DIAGNOSIS — Z720 Tobacco use: Secondary | ICD-10-CM

## 2017-08-08 DIAGNOSIS — B029 Zoster without complications: Secondary | ICD-10-CM

## 2017-08-08 NOTE — Assessment & Plan Note (Signed)
Healing well without recurrence. Reinforced keeping area clean and dry as it continues to heal

## 2017-08-08 NOTE — Progress Notes (Signed)
    Subjective:  Stephen Carroll is a 51 y.o. male who presents to the [facility] today for follow up on shingles  HPI:   Shingles - Seen on August 14 and diagnosed with shingles, started on Valtrex, last dose yesterday - Feels like shingles is almost completely resolved, some minor itching is much improved - Has been keeping scabs clean and dry, using moisturizer - No new vesicles or rash  Stress - States has increased stressful situations in his life recently - mother is undergoing chemotherapy, granddaughters getting open heart surgery, new girlfriend didn't come home last night - Is coping well. Strengths in his life are family support and faith - Denies thoughts of hurting self or others - States had decreased EtOH intake as well as has cut down on tobacco and marijuana. He is very happy with his efforts and even states that a big positive from this is that he has been abel to save money.  ROS: Per HPI  Objective:  Physical Exam: BP 118/80   Pulse 99   Temp 98.5 F (36.9 C) (Oral)   Wt 145 lb (65.8 kg)   SpO2 95%   BMI 22.05 kg/m   Gen: NAD, resting comfortably CV: RRR with no murmurs appreciated Pulm: NWOB, CTAB with no crackles, wheezes, or rhonchi GI: Normal bowel sounds present. Soft, Nontender, Nondistended. MSK: no edema, cyanosis, or clubbing noted Skin: well healing scabs across R neck and shoulder, not TTP Neuro: grossly normal, moves all extremities Psych: Normal affect and thought content   Assessment/Plan:  Herpes zoster without complication Healing well without recurrence. Reinforced keeping area clean and dry as it continues to heal  Marijuana use Encouraged patient's efforts on cutting down. Patient is not ready to quit completely at this time.  Tobacco use Encouraged patient's efforts on cutting down. Patient is not ready to quit completely at this time  Stress  Seems to be coping well on his own at home, plans to move back in with mother. Declined  [place] at this time before his good understanding that can reach out if he needs additional support  Follow-up in 6 months after he sees GI    Bufford Lope, DO PGY-2, [facility] 08/08/2017 8:39 AM

## 2017-08-08 NOTE — Assessment & Plan Note (Signed)
Encouraged patient's efforts on cutting down. Patient is not ready to quit completely at this time.

## 2017-08-08 NOTE — Patient Instructions (Addendum)
It was good to see you today!  Glad to hear things are going relatively well. Please let us know if we can help you with your stress. Please remember that you have a GI appointment on 12/17/2017. I will see you back after that in 6 months.   Please bring all of your medications with you to each visit.   Sign up for My Chart to have easy access to your labs results, and communication with your primary care physician.  Feel free to call with any questions or concerns at any time, at [phone].   Take care,  Dr. Bufford Lope, [place]

## 2017-08-08 NOTE — Assessment & Plan Note (Signed)
Encouraged patient's efforts on cutting down. Patient is not ready to quit completely at this time

## 2017-08-23 ENCOUNTER — Other Ambulatory Visit: Payer: Self-pay | Admitting: Gastroenterology

## 2017-08-24 ENCOUNTER — Telehealth: Payer: Self-pay

## 2017-08-24 NOTE — Telephone Encounter (Signed)
Spoke to pt to schedule overdue OV and communicated importance of follow up care.  Pt committed to Dr. Doyne Keel next available appt on 10-09-17 at 10:30am.  I instructed patient that he must keep this appointment in order to get future refills. He expressed understanding. I refilled request for Aldactone 200mg  daily.

## 2017-09-01 ENCOUNTER — Other Ambulatory Visit: Payer: Self-pay | Admitting: Gastroenterology

## 2017-09-04 ENCOUNTER — Emergency Department (HOSPITAL_COMMUNITY)
Admission: EM | Admit: 2017-09-04 | Discharge: 2017-09-04 | Disposition: A | Discharge status: Home / Self Care | Payer: Medicaid Other | Attending: Emergency Medicine | Admitting: Emergency Medicine

## 2017-09-04 DIAGNOSIS — R1110 Vomiting, unspecified: Secondary | ICD-10-CM | POA: Diagnosis present

## 2017-09-04 DIAGNOSIS — R112 Nausea with vomiting, unspecified: Secondary | ICD-10-CM

## 2017-09-04 DIAGNOSIS — E860 Dehydration: Secondary | ICD-10-CM

## 2017-09-04 DIAGNOSIS — F17290 Nicotine dependence, other tobacco product, uncomplicated: Secondary | ICD-10-CM | POA: Insufficient documentation

## 2017-09-04 DIAGNOSIS — E871 Hypo-osmolality and hyponatremia: Secondary | ICD-10-CM

## 2017-09-04 DIAGNOSIS — F1010 Alcohol abuse, uncomplicated: Secondary | ICD-10-CM | POA: Insufficient documentation

## 2017-09-04 LAB — URINALYSIS, ROUTINE W REFLEX MICROSCOPIC
Bilirubin Urine: NEGATIVE
GLUCOSE, UA: NEGATIVE mg/dL
Hgb urine dipstick: NEGATIVE
Ketones, ur: 5 mg/dL — AB
LEUKOCYTES UA: NEGATIVE
NITRITE: NEGATIVE
PH: 6 (ref 5.0–8.0)
Protein, ur: 100 mg/dL — AB
Specific Gravity, Urine: 1.017 (ref 1.005–1.030)

## 2017-09-04 LAB — CBC
HCT: 34.5 % — ABNORMAL LOW (ref 39.0–52.0)
HEMOGLOBIN: 12.1 g/dL — AB (ref 13.0–17.0)
MCH: 29.2 pg (ref 26.0–34.0)
MCHC: 35.1 g/dL (ref 30.0–36.0)
MCV: 83.1 fL (ref 78.0–100.0)
Platelets: 202 10*3/uL (ref 150–400)
RBC: 4.15 MIL/uL — ABNORMAL LOW (ref 4.22–5.81)
RDW: 14.5 % (ref 11.5–15.5)
WBC: 6.9 10*3/uL (ref 4.0–10.5)

## 2017-09-04 LAB — COMPREHENSIVE METABOLIC PANEL
ALBUMIN: 4.5 g/dL (ref 3.5–5.0)
ALK PHOS: 63 U/L (ref 38–126)
ALT: 51 U/L (ref 17–63)
ANION GAP: 14 (ref 5–15)
AST: 74 U/L — ABNORMAL HIGH (ref 15–41)
BILIRUBIN TOTAL: 0.9 mg/dL (ref 0.3–1.2)
BUN: 8 mg/dL (ref 6–20)
CALCIUM: 10.6 mg/dL — AB (ref 8.9–10.3)
CO2: 24 mmol/L (ref 22–32)
Chloride: 90 mmol/L — ABNORMAL LOW (ref 101–111)
Creatinine, Ser: 0.85 mg/dL (ref 0.61–1.24)
GFR calc Af Amer: >60 mL/min (ref >60)
GLUCOSE: 132 mg/dL — AB (ref 65–99)
Potassium: 4.3 mmol/L (ref 3.5–5.1)
Sodium: 128 mmol/L — ABNORMAL LOW (ref 135–145)
TOTAL PROTEIN: 10.5 g/dL — AB (ref 6.5–8.1)

## 2017-09-04 LAB — LIPASE, BLOOD: LIPASE: 47 U/L (ref 11–51)

## 2017-09-04 MED ORDER — VITAMIN B-1 100 MG PO TABS
100.0000 mg | ORAL_TABLET | Freq: Once | ORAL | Status: AC
  Administered 2017-09-04: 100 mg via ORAL
  Filled 2017-09-04: qty 1

## 2017-09-04 MED ORDER — LORAZEPAM 2 MG/ML IJ SOLN
1.0000 mg | Freq: Once | INTRAMUSCULAR | Status: AC
  Administered 2017-09-04: 1 mg via INTRAVENOUS
  Filled 2017-09-04: qty 1

## 2017-09-04 MED ORDER — SODIUM CHLORIDE 0.9 % IV BOLUS (SEPSIS)
1000.0000 mL | Freq: Once | INTRAVENOUS | Status: AC
  Administered 2017-09-04: 1000 mL via INTRAVENOUS

## 2017-09-04 MED ORDER — FOLIC ACID 1 MG PO TABS
1.0000 mg | ORAL_TABLET | Freq: Once | ORAL | Status: AC
  Administered 2017-09-04: 1 mg via ORAL
  Filled 2017-09-04: qty 1

## 2017-09-04 NOTE — ED Notes (Signed)
Pt aware of need for urine  

## 2017-09-04 NOTE — ED Notes (Signed)
Pt given food and water and was able to hold it down with out vomiting

## 2017-09-04 NOTE — ED Triage Notes (Signed)
Pt reports 3 episodes of vomiting this morning. States he feels weak and dizzy. HR elevated. EKG obtained.

## 2017-09-04 NOTE — ED Provider Notes (Signed)
[facility] DEPT Provider Note   CSN: [id number] Arrival date & time: 09/04/17  1350     History   Chief Complaint Chief Complaint  Patient presents with  . Emesis  . Fatigue    HPI Stephen Carroll is a 51 y.o. male.  Patient is a 51 year old male with a history of alcohol abuse, cirrhosis and prior alcohol withdrawal seizures presenting today with 12 hours of vomiting. Patient states he started vomiting last night and it discontinued today. He has been unable to hold anything down today including alcohol. He also complains that now he's starting to feel little bit shaky and he is having cramping in his legs.  He denies any localized abdominal pain, fever or diarrhea. He's had no leg swelling, cough, shortness of breath or chest pain. Patient drank his normal amount yesterday. He is still taking his medications and denies any new medications. He denies any opiate use.   The history is provided by the patient.    Past Medical History:  Diagnosis Date  . Alcohol use 1985  . Alcohol withdrawal seizure ([place])   . Alcoholic cirrhosis of liver with ascites ([place]) 2017  . 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 Arthritis   . Migraine 1984    Patient Active Problem List   Diagnosis Date Noted  . Herpes zoster without complication 56/81/2751  . Tobacco use 07/01/2017  . Marijuana use 07/01/2017  . Low back pain 05/29/2017  . Rib contusion, left, subsequent encounter 05/22/2017  . Alcoholic cirrhosis of liver with ascites ([place])   . Alcohol withdrawal seizure ([place])   . 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 Alcohol withdrawal ([place]) 08/07/2015  . Alcohol abuse 08/07/2015    Past Surgical History:  Procedure Laterality Date  . [place]   Pt. reports rods placed in back  . IR GENERIC HISTORICAL  07/17/2016   IR VENOGRAM HEPATIC WO HEMODYNAMIC EVALUATION 07/17/2016 Aletta Edouard, MD [facility]  . IR GENERIC HISTORICAL  07/17/2016   IR TRANSCATHETER BX  07/17/2016 Aletta Edouard, MD [facility]  . IR GENERIC HISTORICAL  07/17/2016   IR US GUIDE BX ASP/DRAIN 07/17/2016 Aletta Edouard, MD [facility]  . IR GENERIC HISTORICAL  07/13/2016   IR ANGIO INTRA EXTRACRAN SEL COM CAROTID INNOMINATE BILAT MOD SED 07/13/2016 Luanne Bras, MD [facility]  . IR GENERIC HISTORICAL  07/13/2016   IR ANGIO VERTEBRAL SEL SUBCLAVIAN INNOMINATE BILAT MOD SED 07/13/2016 Luanne Bras, MD [facility]       Home Medications    Prior to Admission medications   Medication Sig Start Date End Date Taking? Authorizing Provider  furosemide (LASIX) 40 MG tablet TAKE 1 TABLET BY MOUTH TWICE A DAY 09/03/17   Armbruster, Carlota Raspberry, MD  Multiple Vitamin (MULTIVITAMIN WITH MINERALS) TABS tablet Take 1 tablet by mouth daily. Patient not taking: Reported on 04/05/2017 05/23/16   Katheren Shams, DO  spironolactone (ALDACTONE) 100 MG tablet TAKE 2 TABLETS (200 MG TOTAL) BY MOUTH DAILY. 08/24/17   Armbruster, Carlota Raspberry, MD  thiamine 100 MG tablet Take 1 tablet (100 mg total) by mouth daily. Patient not taking: Reported on 04/05/2017 05/29/16   Lucious Groves, DO    Family History Family History  Problem Relation Age of Onset  . Hypertension Mother   . Alzheimer's disease Mother   . Cancer Mother   . Alcoholism Father        deceased  . Colon cancer Neg Hx   . Esophageal cancer Neg  Hx   . Stomach cancer Neg Hx   . Rectal cancer Neg Hx     Social History Social History  Substance Use Topics  . Smoking status: Current Some Day Smoker    Packs/day: 0.00    Years: 30.00    Types: Cigars  . Smokeless tobacco: Never Used     Comment: occasional cigar use 1x/week  . Alcohol use 4.2 oz/week    7 Cans of beer per week     Allergies   Patient has no known allergies.   Review of Systems Review of Systems  All other systems reviewed and are negative.    Physical Exam Updated Vital Signs BP (!) 157/98 (BP Location: Right Arm)   Pulse (!) 133   Temp 97.8 F  (36.6 C) (Oral)   Resp 16   Ht 5\' 8"  (1.727 m)   Wt 71.2 kg (157 lb)   SpO2 100%   BMI 23.87 kg/m   Physical Exam  Constitutional: He is oriented to person, place, and time. He appears well-developed and well-nourished. No distress.  HENT:  Head: Normocephalic and atraumatic.  Mouth/Throat: Oropharynx is clear and moist. Mucous membranes are dry.  Eyes: Pupils are equal, round, and reactive to light. Conjunctivae and EOM are normal.  Neck: Normal range of motion. Neck supple.  Cardiovascular: Regular rhythm and intact distal pulses.  Tachycardia present.   No murmur heard. Pulmonary/Chest: Effort normal and breath sounds normal. No respiratory distress. He has no wheezes. He has no rales.  Abdominal: Soft. Bowel sounds are normal. He exhibits no distension. There is no tenderness. There is no rebound and no guarding.  Musculoskeletal: Normal range of motion. He exhibits no edema or tenderness.  Neurological: He is alert and oriented to person, place, and time.  Tremulous on exam with an outstretched his hands and mild tremulousness in the face  Skin: Skin is warm and dry. No rash noted. No erythema.  Psychiatric: He has a normal mood and affect. His behavior is normal.  Nursing note and vitals reviewed.    ED Treatments / Results  Labs (all labs ordered are listed, but only abnormal results are displayed) Labs Reviewed  COMPREHENSIVE METABOLIC PANEL - Abnormal; Notable for the following:       Result Value   Sodium 128 (*)    Chloride 90 (*)    Glucose, Bld 132 (*)    Calcium 10.6 (*)    Total Protein 10.5 (*)    AST 74 (*)    All other components within normal limits  CBC - Abnormal; Notable for the following:    RBC 4.15 (*)    Hemoglobin 12.1 (*)    HCT 34.5 (*)    All other components within normal limits  URINALYSIS, ROUTINE W REFLEX MICROSCOPIC - Abnormal; Notable for the following:    Ketones, ur 5 (*)    Protein, ur 100 (*)    Bacteria, UA RARE (*)     Squamous Epithelial / LPF 0-5 (*)    All other components within normal limits  LIPASE, BLOOD    EKG  EKG Interpretation  Date/Time:  Tuesday September 04 2017 14:03:14 EDT Ventricular Rate:  126 PR Interval:  138 QRS Duration: 76 QT Interval:  318 QTC Calculation: 460 R Axis:   48 Text Interpretation:  Sinus tachycardia Otherwise normal ECG No significant change since last tracing Confirmed by Blanchie Dessert [phone]) on 09/04/2017 3:32:21 PM       Radiology No results found.  Procedures Procedures (including critical care time)  Medications Ordered in ED Medications  sodium chloride 0.9 % bolus 1,000 mL (not administered)  LORazepam (ATIVAN) injection 1 mg (not administered)     Initial Impression / Assessment and Plan / ED Course  I have reviewed the triage vital signs and the nursing notes.  Pertinent labs & imaging results that were available during my care of the patient were reviewed by me and considered in my medical decision making (see chart for details).     51 year old male presenting with vomiting but no other infectious complaints such as fever or diarrhea. Patient is a known heavy alcohol user and states the vomiting started last night and he has not been able to drink any alcohol today. He has no localized abdominal tenderness concerning for cholecystitis, pancreatitis, appendicitis or diverticulitis. Labs do show a CMP with a sodium of 128 which is mildly lower than his normal of 130, mild elevated AST which is unchanged, CBC and lipase without acute findings.  On exam patient is slightly tremulous and concern for early withdrawal. He is tachycardic and hypertensive which also I believe is related to withdrawal and dehydration. Vomiting is most likely related to alcohol use. Low suspicion for cardiac or respiratory etiology. Patient given Ativan, thiamine, folate and IV fluids.  7:38 PM After 2 L of fluid patient is feeling much better. Tolerating by  mouth's. Discharged home.  Final Clinical Impressions(s) / ED Diagnoses   Final diagnoses:  Dehydration  Hyponatremia  Intractable vomiting with nausea, unspecified vomiting type    New Prescriptions New Prescriptions   No medications on file     Blanchie Dessert, MD 09/05/17 1355

## 2017-10-09 ENCOUNTER — Ambulatory Visit: Payer: Self-pay | Admitting: Gastroenterology

## 2017-11-16 ENCOUNTER — Other Ambulatory Visit: Payer: Self-pay | Admitting: Gastroenterology

## 2017-11-21 ENCOUNTER — Other Ambulatory Visit: Payer: Self-pay | Admitting: Gastroenterology

## 2017-11-23 ENCOUNTER — Other Ambulatory Visit: Payer: Self-pay | Admitting: Gastroenterology

## 2017-12-10 ENCOUNTER — Ambulatory Visit: Payer: Medicaid Other | Admitting: Gastroenterology

## 2018-02-08 ENCOUNTER — Ambulatory Visit: Payer: Medicaid Other | Admitting: Family Medicine

## 2018-03-08 ENCOUNTER — Other Ambulatory Visit: Payer: Self-pay | Admitting: Gastroenterology

## 2018-03-08 NOTE — Telephone Encounter (Signed)
Pt no showed 10-09-17 and 12-10-17 appts.

## 2018-03-08 NOTE — Telephone Encounter (Signed)
Jan I recommend the following: - he should be taking this with lasix, I hope he has been doing that - he hasn't had labs since September, he needs CMET drawn. I will give him 2 week supply and he needs labs drawn to get more of it. If labs look okay I will give him additional refill.  - he should make a follow up appointment to get further refills. He has no showed the last 2 visits. If he no shows another appointment he may be discharged from the practice and will need to receive care elsewhere

## 2018-03-12 ENCOUNTER — Telehealth: Payer: Self-pay

## 2018-03-12 NOTE — Telephone Encounter (Signed)
The phone number we have for pt is out of service. Sent detailed letter to pt. Sent 2 week supply of Aldactone to pharmacy with note to pharmacy to instruct pt call us.

## 2018-03-12 NOTE — Telephone Encounter (Signed)
-----   Message from Roetta Sessions, [place] sent at 03/11/2018  9:47 AM EDT ----- Regarding: aldactone Jan I recommend the following: - he should be taking this with lasix, I hope he has been doing that - he hasn't had labs since September, he needs CMET drawn. I will give him 2 week supply and he needs labs drawn to get more of it. If labs look okay I will give him additional refill.  - he should make a follow up appointment to get further refills. He has no showed the last 2 visits. If he no shows another appointment he may be discharged from the practice and will need to receive care elsewhere

## 2018-04-24 ENCOUNTER — Other Ambulatory Visit: Payer: Self-pay

## 2018-04-24 DIAGNOSIS — K7031 Alcoholic cirrhosis of liver with ascites: Secondary | ICD-10-CM

## 2018-05-01 ENCOUNTER — Other Ambulatory Visit: Payer: Self-pay | Admitting: Gastroenterology

## 2018-05-01 NOTE — Telephone Encounter (Signed)
Pt needs rf on aldactone sent to [facility] on [address].

## 2018-05-02 ENCOUNTER — Other Ambulatory Visit: Payer: Self-pay

## 2018-05-02 DIAGNOSIS — K7031 Alcoholic cirrhosis of liver with ascites: Secondary | ICD-10-CM

## 2018-05-02 NOTE — Telephone Encounter (Signed)
Pt called in and spoke to Dominica. She explained that he needed to have labs.  He said he would go to the lab tomorrow. She let him know we will send him medication to the pharmacy as soon as we have those results. He gave her a new number that we can reach him at. He expressed understanding.

## 2018-05-02 NOTE — Progress Notes (Signed)
Labs entered.

## 2018-05-02 NOTE — Telephone Encounter (Signed)
Thanks Jan 

## 2018-05-02 NOTE — Telephone Encounter (Signed)
He should be on lasix 40mg  once daily and aldactone 100mg  once daily. We can give him enough medication to get to his clinic appointment if that is in the near future. Please ask him to go to the lab in the interim, needs CBC, CMET, INR, and AFP. thanks

## 2018-05-03 ENCOUNTER — Other Ambulatory Visit: Payer: Self-pay

## 2018-05-03 ENCOUNTER — Other Ambulatory Visit (INDEPENDENT_AMBULATORY_CARE_PROVIDER_SITE_OTHER): Payer: Medicaid Other

## 2018-05-03 DIAGNOSIS — K7031 Alcoholic cirrhosis of liver with ascites: Secondary | ICD-10-CM

## 2018-05-03 LAB — CBC WITH DIFFERENTIAL/PLATELET
BASOS ABS: 0.1 10*3/uL (ref 0.0–0.1)
Basophils Relative: 1.4 % (ref 0.0–3.0)
EOS ABS: 0.1 10*3/uL (ref 0.0–0.7)
EOS PCT: 1.9 % (ref 0.0–5.0)
HCT: 35.1 % — ABNORMAL LOW (ref 39.0–52.0)
Hemoglobin: 11.8 g/dL — ABNORMAL LOW (ref 13.0–17.0)
Lymphocytes Relative: 38.6 % (ref 12.0–46.0)
Lymphs Abs: 1.5 10*3/uL (ref 0.7–4.0)
MCHC: 33.6 g/dL (ref 30.0–36.0)
MCV: 85.7 fl (ref 78.0–100.0)
MONO ABS: 0.6 10*3/uL (ref 0.1–1.0)
Monocytes Relative: 15.1 % — ABNORMAL HIGH (ref 3.0–12.0)
NEUTROS PCT: 43 % (ref 43.0–77.0)
Neutro Abs: 1.6 10*3/uL (ref 1.4–7.7)
Platelets: 179 10*3/uL (ref 150.0–400.0)
RBC: 4.09 Mil/uL — AB (ref 4.22–5.81)
RDW: 13.8 % (ref 11.5–15.5)
WBC: 3.8 10*3/uL — ABNORMAL LOW (ref 4.0–10.5)

## 2018-05-03 LAB — COMPREHENSIVE METABOLIC PANEL
ALBUMIN: 4.2 g/dL (ref 3.5–5.2)
ALK PHOS: 57 U/L (ref 39–117)
ALT: 35 U/L (ref 0–53)
AST: 59 U/L — ABNORMAL HIGH (ref 0–37)
BILIRUBIN TOTAL: 0.6 mg/dL (ref 0.2–1.2)
BUN: 5 mg/dL — AB (ref 6–23)
CO2: 23 mEq/L (ref 19–32)
CREATININE: 0.61 mg/dL (ref 0.40–1.50)
Calcium: 9.5 mg/dL (ref 8.4–10.5)
Chloride: 100 mEq/L (ref 96–112)
GFR: 179.13 mL/min (ref >60.00)
Glucose, Bld: 98 mg/dL (ref 70–99)
Potassium: 3.7 mEq/L (ref 3.5–5.1)
SODIUM: 134 meq/L — AB (ref 135–145)
TOTAL PROTEIN: 9.1 g/dL — AB (ref 6.0–8.3)

## 2018-05-03 LAB — PROTIME-INR
INR: 1.2 ratio — AB (ref 0.8–1.0)
PROTHROMBIN TIME: 13.9 s — AB (ref 9.6–13.1)

## 2018-05-03 MED ORDER — FUROSEMIDE 40 MG PO TABS: 40.0000 mg | ORAL_TABLET | Freq: Every day | ORAL | 0 refills | Status: DC

## 2018-05-03 MED ORDER — SPIRONOLACTONE 100 MG PO TABS: 100.0000 mg | ORAL_TABLET | Freq: Every day | ORAL | 0 refills | Status: DC

## 2018-05-03 NOTE — Telephone Encounter (Signed)
Pt had labs drawn today.  Refills of lasix and aldactone sent for 2 weeks to get pt to appt on June 3rd.

## 2018-05-03 NOTE — Telephone Encounter (Signed)
Thanks Jan. I reviewed labs and stable. I will see him for his clinic appointment in the near future. Thanks

## 2018-05-08 LAB — AFP TUMOR MARKER: AFP TUMOR MARKER: 6.2 ng/mL — AB (ref <6.1)

## 2018-05-13 ENCOUNTER — Other Ambulatory Visit: Payer: Self-pay

## 2018-05-13 ENCOUNTER — Ambulatory Visit: Payer: Medicaid Other | Admitting: Gastroenterology

## 2018-05-13 ENCOUNTER — Encounter (INDEPENDENT_AMBULATORY_CARE_PROVIDER_SITE_OTHER): Payer: Self-pay

## 2018-05-13 ENCOUNTER — Encounter: Payer: Self-pay | Admitting: Gastroenterology

## 2018-05-13 VITALS — BP 132/84 | HR 90 | Ht 67.0 in | Wt 154.1 lb

## 2018-05-13 DIAGNOSIS — K7031 Alcoholic cirrhosis of liver with ascites: Secondary | ICD-10-CM | POA: Diagnosis not present

## 2018-05-13 DIAGNOSIS — I8500 Esophageal varices without bleeding: Secondary | ICD-10-CM

## 2018-05-13 MED ORDER — PROPRANOLOL HCL 20 MG PO TABS: 20.0000 mg | ORAL_TABLET | Freq: Two times a day (BID) | ORAL | 5 refills | Status: DC

## 2018-05-13 MED ORDER — NADOLOL 40 MG PO TABS: 40.0000 mg | ORAL_TABLET | Freq: Every day | ORAL | 3 refills | Status: DC

## 2018-05-13 MED ORDER — FUROSEMIDE 40 MG PO TABS: 40.0000 mg | ORAL_TABLET | Freq: Every day | ORAL | 5 refills | Status: DC

## 2018-05-13 MED ORDER — SPIRONOLACTONE 100 MG PO TABS: 100.0000 mg | ORAL_TABLET | Freq: Every day | ORAL | 5 refills | Status: DC

## 2018-05-13 NOTE — Progress Notes (Signed)
HPI :  52 y/o male with a history of cirrhosis suspected due to alcoholism, here for a follow up visit. I have not seen him since 2017. After his initial visit with me he had labs done to exclude other chronic liver diseases. He had a  (+) SMA, IgG to 4184, ANA 1:80. 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 Second opinion done at [facility] which showed alcohol as the likely etiology and less likely autoimmune hep. Referred to liver clinic, Dr. Precious Gilding office for second opinion, who also thought he had alcoholic cirrhosis. Last seen there on 06/21/2017, and he has not followed up with them  He had an EGD for varices screening in 2017 which did show small esophageal varices. He was placed on nadolol. He took this and states he tolerated it however stopped it at some point in time when he ran out and did not resume it. He is taking lasix 40mg  once daily and aldactone 100mg  daily and tolerating them well. He denies lower extremity edema or ascites.  He denies any abdominal pains.  He denies any problems with his bowels present time.   He does endorse cutting back on the amount of alcohol he drinks however he has been drinking more recently as he is dealing with the death of his mother.  He has been drinking 2-3 beers a day in recent weeks.  He recently had labs done showing an increase in AFP level from 3.5-6.2.  Liver enzymes show mild increase in AST to 59 and total protein elevation at 9.1, previously was at 10.5.  Chronic anemia with hemoglobin of 11.8 stable over the past year.  MCV 85.  INR of 1.2. Last imaging was done in June 2018 with CT showing fatty liver / cirrhosis with stable 1.3cm lesion in the right lobe of the liver.    EGD 08/04/2016 - 3cm HH, small esophageal varices, portal hypertensive gastritis - started on nadolol 40mg , H pylori negative Colonoscopy 08/04/2016 - 3 small polyps, large internal hemorrhoids - 3  adenomas - recall 07/2019   Past Medical History:  Diagnosis Date  . Alcohol use 1985  . Alcohol withdrawal seizure ([place])   . Alcoholic cirrhosis of liver with ascites ([place]) 2017  . 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 Arthritis   . Migraine 1984     Past Surgical History:  Procedure Laterality Date  . [place]   Pt. reports rods placed in back  . IR GENERIC HISTORICAL  07/17/2016   IR VENOGRAM HEPATIC WO HEMODYNAMIC EVALUATION 07/17/2016 Aletta Edouard, MD [facility]  . IR GENERIC HISTORICAL  07/17/2016   IR TRANSCATHETER BX 07/17/2016 Aletta Edouard, MD [facility]  . IR GENERIC HISTORICAL  07/17/2016   IR US GUIDE BX ASP/DRAIN 07/17/2016 Aletta Edouard, MD [facility]  . IR GENERIC HISTORICAL  07/13/2016   IR ANGIO INTRA EXTRACRAN SEL COM CAROTID INNOMINATE BILAT MOD SED 07/13/2016 Luanne Bras, MD [facility]  . IR GENERIC HISTORICAL  07/13/2016   IR ANGIO VERTEBRAL SEL SUBCLAVIAN INNOMINATE BILAT MOD SED 07/13/2016 Luanne Bras, MD [facility]   Family History  Problem Relation Age of Onset  . Hypertension Mother   . Alzheimer's disease Mother   . Cancer Mother   . Alcoholism Father        deceased  . Colon cancer Neg Hx   . Esophageal cancer Neg Hx   . Stomach cancer Neg Hx   .  Rectal cancer Neg Hx    Social History   Tobacco Use  . Smoking status: Current Some Day Smoker    Packs/day: 0.00    Years: 30.00    Pack years: 0.00    Types: Cigars  . Smokeless tobacco: Never Used  . Tobacco comment: occasional cigar use 1x/week  Substance Use Topics  . Alcohol use: Yes    Alcohol/week: 4.2 oz    Types: 7 Cans of beer per week  . Drug use: Yes    Types: Marijuana   Current Outpatient Medications  Medication Sig Dispense Refill  . furosemide (LASIX) 40 MG tablet Take 1 tablet (40 mg total) by mouth daily. 15 tablet 0  . spironolactone (ALDACTONE) 100 MG tablet Take 1 tablet (100 mg total) by mouth daily. 15 tablet 0   No current  facility-administered medications for this visit.    No Known Allergies   Review of Systems: All systems reviewed and negative except where noted in HPI.   Lab Results  Component Value Date   WBC 3.8 (L) 05/03/2018   HGB 11.8 (L) 05/03/2018   HCT 35.1 (L) 05/03/2018   MCV 85.7 05/03/2018   PLT 179.0 05/03/2018    Lab Results  Component Value Date   CREATININE 0.61 05/03/2018   BUN 5 (L) 05/03/2018   NA 134 (L) 05/03/2018   K 3.7 05/03/2018   CL 100 05/03/2018   CO2 23 05/03/2018    Lab Results  Component Value Date   ALT 35 05/03/2018   AST 59 (H) 05/03/2018   ALKPHOS 57 05/03/2018   BILITOT 0.6 05/03/2018     Physical Exam: BP 132/84   Pulse 90   Ht 5\' 7"  (1.702 m)   Wt 154 lb 2 oz (69.9 kg)   BMI 24.14 kg/m  Constitutional: Pleasant,  male in no acute distress. HEENT: Normocephalic and atraumatic. Conjunctivae are normal. No scleral icterus. Neck supple.  Cardiovascular: Normal rate, regular rhythm.  Pulmonary/chest: Effort normal and breath sounds normal. No wheezing, rales or rhonchi. Abdominal: Soft, nondistended, nontender. There are no masses palpable. No appreciable ascites Extremities: no edema. No asterixis Lymphadenopathy: No cervical adenopathy noted. Neurological: Alert and oriented to person place and time. Skin: Skin is warm and dry. No rashes noted. Psychiatric: Normal mood and affect. Behavior is normal.   ASSESSMENT AND PLAN: 52 year old male here for reassessment for history of cirrhosis:  Cirrhosis with history of ascites / esophageal varices - as above extensive evaluation including liver biopsy.  His serologic testing is concerning for autoimmune hepatitis however his liver biopsy argued more so for alcoholic liver disease.  I had referred him to hepatology who felt that previously alcohol-related liver disease was more likely.  It was thought that his elevated gamma globulinemia was due to alcoholic hepatitis. I have not seen him in  almost 2 years, we discussed the importance of compliance and follow-up.  He is continuing to drink alcohol which is putting him at risk for further decompensation.  Recommend he abstain from alcohol completely although is having a difficult time doing so with the death of his mother.  He will continue to work on this and we can support him if he wishes to have assistance. He has a chronic anemia suspect may be related to alcohol use.  He has had prior iron studies which have been okay and an EGD and colonoscopy previously.  He does not have any appreciable ascites on exam today, we will continue his present dosing  of diuretics as his kidney function and electrolytes are stable.  He was previously placed on nadolol but ran out of this and was not resumed.  He has a history of small esophageal varices.  I discussed options with him to include repeating an EGD to assess for presence of varices versus putting him back on nonspecific beta-blockade given history of varices.  He wished to go back on beta-blocker, given insurance would not cover nadolol we will place him on propranolol 20 mg twice daily.  He is due for [facility] screening, especially with mild rise in AFP along with small liver lesion noted historically.  In light of these findings will recommend MRI of the liver at this time.  He needs to follow-up with [place] every 6 months and follow-up with hepatology once yearly. He agreed with the plan as outlined.   Hazel Cellar, MD [facility]

## 2018-05-13 NOTE — Progress Notes (Signed)
Nadolol not covered.  Per Dr. Havery Moros change to Propranolol 20mg  BID.  LM for pt at [phone] to call back to explain change.

## 2018-05-13 NOTE — Patient Instructions (Signed)
If you are age 51 or older, your body mass index should be between 23-30. Your Body mass index is 24.14 kg/m. If this is out of the aforementioned range listed, please consider follow up with your Primary Care Provider.  If you are age 36 or younger, your body mass index should be between 19-25. Your Body mass index is 24.14 kg/m. If this is out of the aformentioned range listed, please consider follow up with your Primary Care Provider.   We have sent the following medications to your pharmacy for you to pick up at your convenience: Nadolol 40mg : Take once daily  We will refill your Lasix and Aldactone to last 6 months when we would like to see you back in the office.    You have been scheduled for an MRI at [facility], located at [place]. Lawrence Santiago in the [facility]. Your appointment is scheduled on Friday, 05-17-2018 at 8:00am. Please arrive 15 minutes prior to your appointment time for registration purposes. Please make certain not to have anything to eat or drink 4 hours prior to your test. In addition, if you have any metal in your body, have a pacemaker or defibrillator, please be sure to let your ordering physician know. This test typically takes 45 minutes to 1 hour to complete. Should you need to reschedule, please call [phone] to do so.   Please call in October and schedule an appointment for December of 2019:  [phone]  Thank you for entrusting me with your care and for choosing [facility], Dr. Elgin Cellar

## 2018-05-17 ENCOUNTER — Ambulatory Visit (HOSPITAL_COMMUNITY): Admission: RE | Admit: 2018-05-17 | Payer: Medicaid Other | Source: Ambulatory Visit

## 2018-05-30 ENCOUNTER — Telehealth: Payer: Self-pay | Admitting: Gastroenterology

## 2018-05-30 DIAGNOSIS — K7031 Alcoholic cirrhosis of liver with ascites: Secondary | ICD-10-CM

## 2018-05-30 NOTE — Telephone Encounter (Signed)
Insurance is denying MRI liver to screen for [facility], stating US must be done first. CT scan done about a year ago showed a stable small lesion in the liver (dating back to 2015), although AFP has increased since last checked and just over the limit of normal. Will start with [place] and if any concerning changes will again order MRI.  Jan, can you cancel the MRI and order RUQ [place] to start? He still may need MRI pending findings. Thanks

## 2018-05-30 NOTE — Telephone Encounter (Signed)
Order placed for Ultrasound.  Cancelled MRI.  Scheduled U/S for July 2 at [place] at 8:45am. NPO after midnight.

## 2018-05-30 NOTE — Telephone Encounter (Signed)
LM for pt to call back regarding cancelling MRI and having U/S instead.  Same day and time but different location.

## 2018-05-31 NOTE — Telephone Encounter (Signed)
Letter sent to pt with date time and instructions for U/S.

## 2018-06-03 NOTE — Telephone Encounter (Signed)
Called pt once more at both numbers.  Not available.

## 2018-06-04 NOTE — Telephone Encounter (Signed)
LM for pt to call back to confirm he is aware of appt change on 06-11-18 (U/S, not MRI) due to insurance.

## 2018-06-11 ENCOUNTER — Ambulatory Visit (HOSPITAL_COMMUNITY)
Admission: RE | Admit: 2018-06-11 | Discharge: 2018-06-11 | Disposition: A | Discharge status: Home / Self Care | Payer: Medicaid Other | Source: Ambulatory Visit | Attending: Gastroenterology | Admitting: Gastroenterology

## 2018-06-11 ENCOUNTER — Ambulatory Visit (HOSPITAL_COMMUNITY): Payer: Medicaid Other

## 2018-06-11 DIAGNOSIS — K7031 Alcoholic cirrhosis of liver with ascites: Secondary | ICD-10-CM

## 2018-06-11 DIAGNOSIS — K7460 Unspecified cirrhosis of liver: Secondary | ICD-10-CM | POA: Diagnosis not present

## 2018-08-10 IMAGING — US US ABDOMEN LIMITED
1 series · 14 of 25 positions shown · non-contrast
Comparison: CT 05/17/2017.

CLINICAL DATA: Cirrhosis.

EXAM:
ULTRASOUND ABDOMEN LIMITED RIGHT UPPER QUADRANT

[Series 1: us abdomen limited · 14 of 51 slices shown]
[im 1/51]
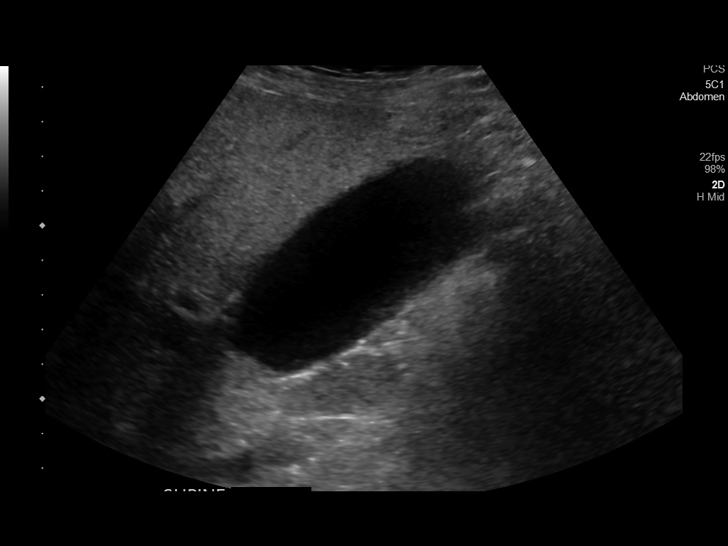
[im 5/51]
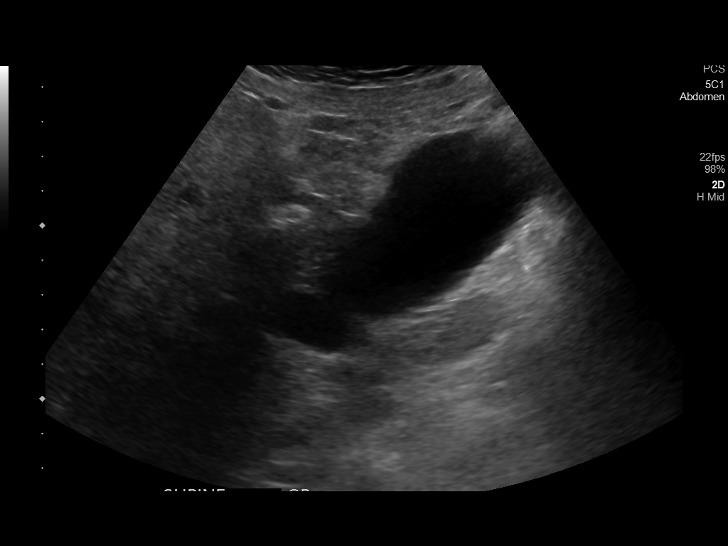
[im 9/51]
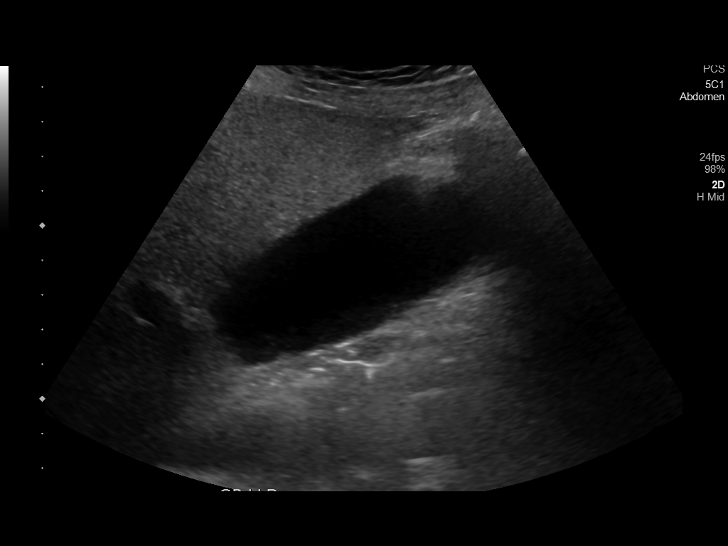
[im 13/51]
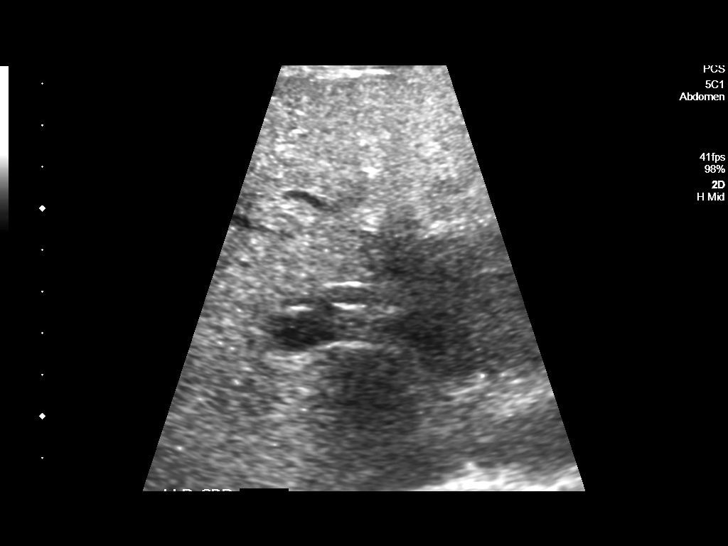
[im 17/51]
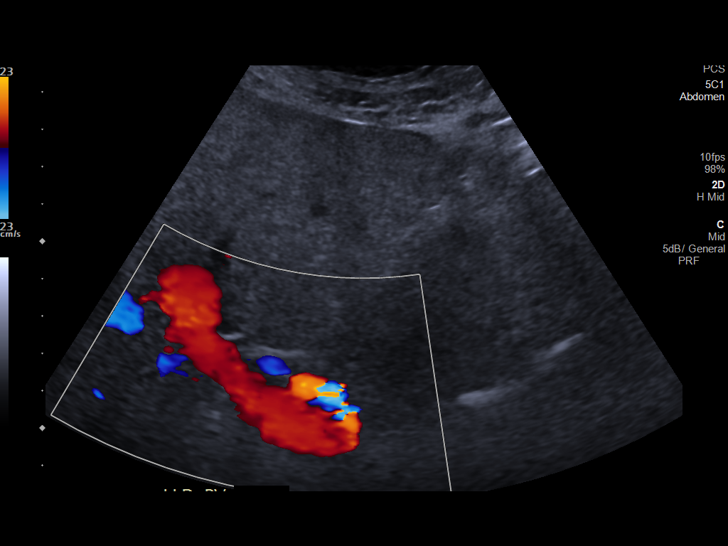
[im 19/51]
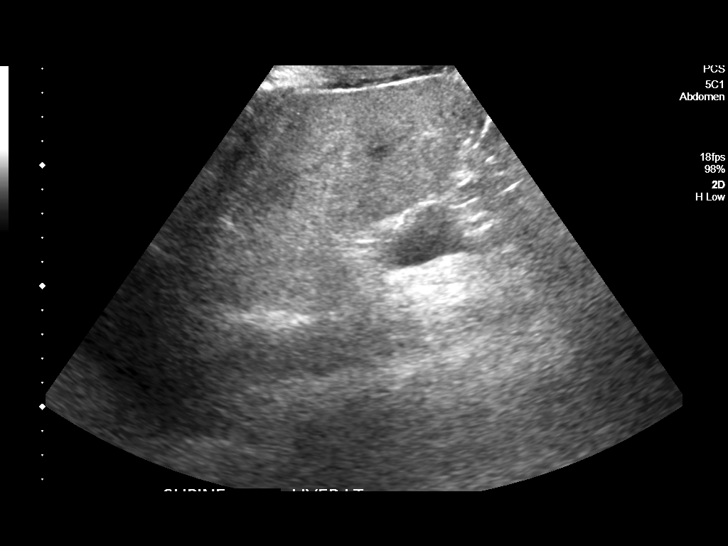
[im 23/51]
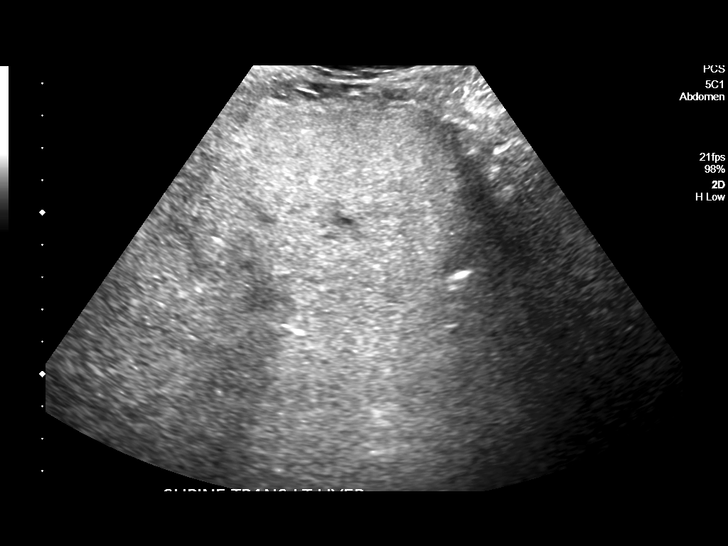
[im 28/51]
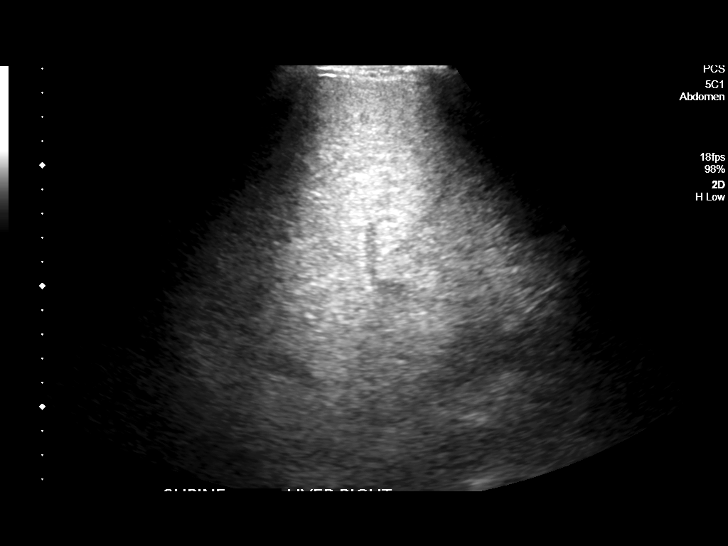
[im 32/51]
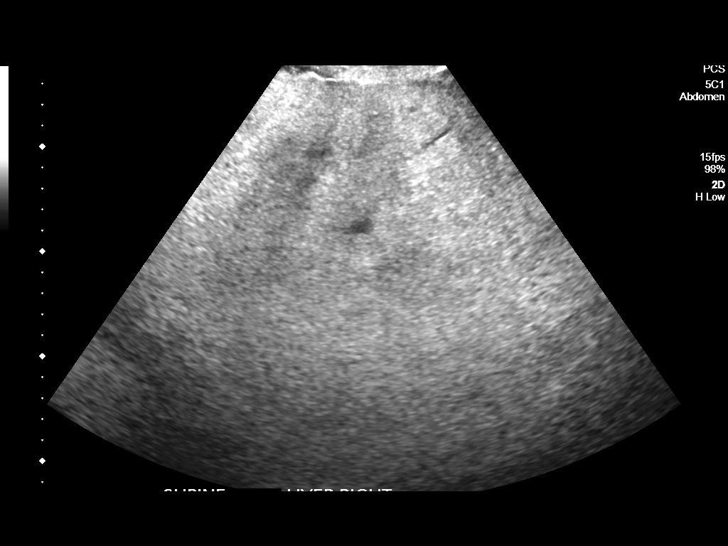
[im 34/51]
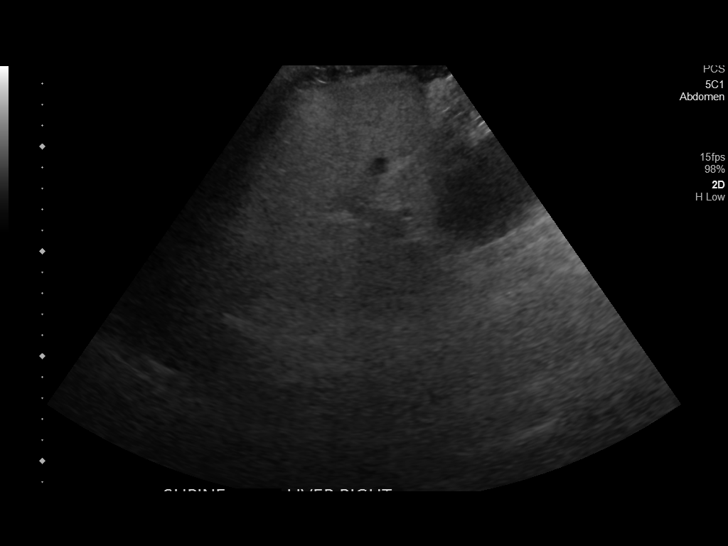
[im 38/51]
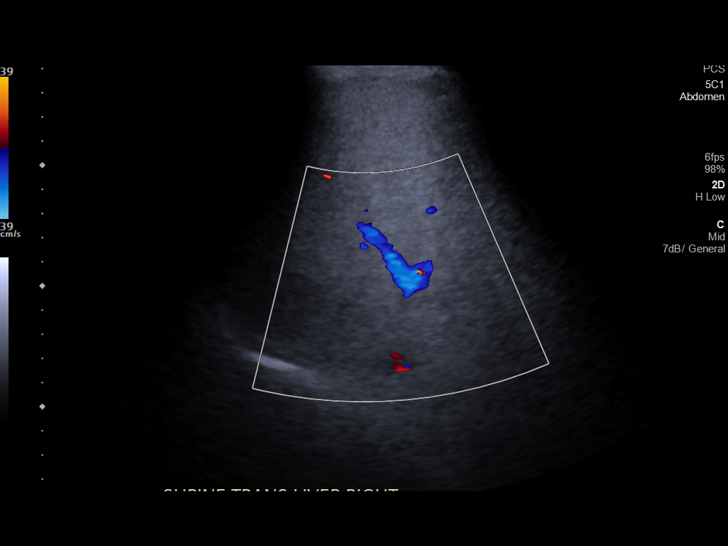
[im 42/51]
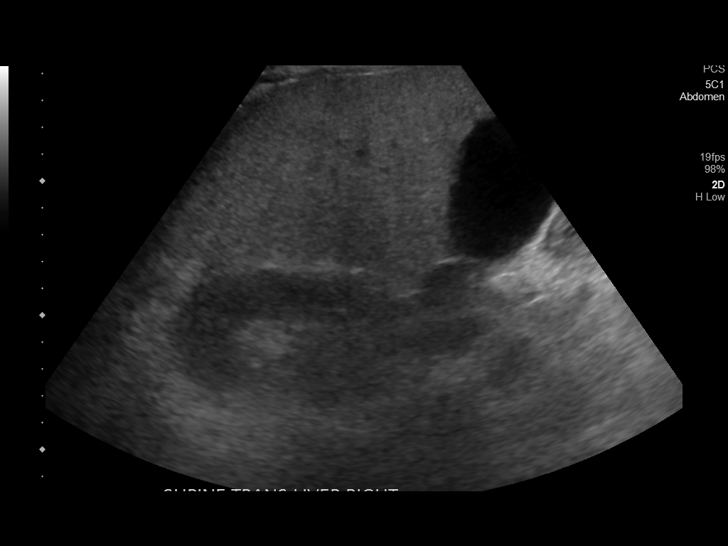
[im 46/51]
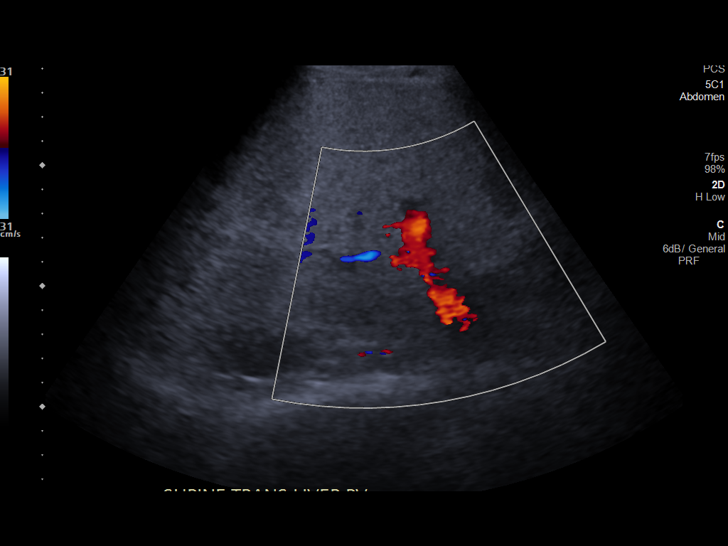
[im 51/51]
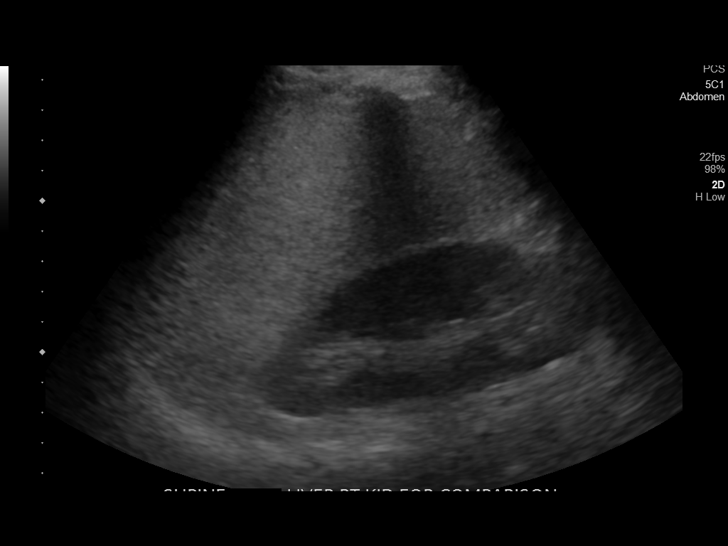

[14 of 25 positions shown; findings below may reference images not displayed]

FINDINGS: Gallbladder:

No gallstones or wall thickening visualized. No sonographic Murphy
sign noted by sonographer.

Common bile duct:

Diameter: 4.4 mm

Liver:

Increase echogenicity consistent fatty infiltration and/or
hepatocellular disease. Slightly irregular hepatic contour noted
consistent patient's history of cirrhosis. No focal hepatic
abnormality identified.
IMPRESSION: 1. Increased hepatic echogenicity consistent fatty infiltration or
hepatocellular disease. Slightly irregular hepatic contour noted
consistent patient's history of cirrhosis. No focal hepatic
abnormality.

2.  No gallstones or biliary distention.

## 2018-09-12 ENCOUNTER — Telehealth: Payer: Self-pay

## 2018-09-12 DIAGNOSIS — K7031 Alcoholic cirrhosis of liver with ascites: Secondary | ICD-10-CM

## 2018-09-12 NOTE — Telephone Encounter (Signed)
Called patient and LM that it is time for him to have repeat lab work done.  We also need a new mailing address for him. Order entered for AFP

## 2018-09-12 NOTE — Telephone Encounter (Signed)
-----   Message from Roetta Sessions, [place] sent at 06/14/2018  8:24 AM EDT ----- Regarding: AFP due in Oct AFP due in October.  Slightly elevated in July.

## 2018-09-12 NOTE — Progress Notes (Signed)
Letter sent to pt regarding lab due

## 2018-09-16 ENCOUNTER — Encounter (HOSPITAL_COMMUNITY): Payer: Self-pay

## 2018-09-16 ENCOUNTER — Emergency Department (HOSPITAL_COMMUNITY)
Admission: EM | Admit: 2018-09-16 | Discharge: 2018-09-16 | Disposition: A | Discharge status: Home / Self Care | Payer: Medicaid Other | Attending: Emergency Medicine | Admitting: Emergency Medicine

## 2018-09-16 ENCOUNTER — Emergency Department (HOSPITAL_COMMUNITY): Payer: Medicaid Other

## 2018-09-16 ENCOUNTER — Other Ambulatory Visit: Payer: Self-pay

## 2018-09-16 DIAGNOSIS — M545 Low back pain: Secondary | ICD-10-CM | POA: Diagnosis not present

## 2018-09-16 DIAGNOSIS — Y999 Unspecified external cause status: Secondary | ICD-10-CM | POA: Diagnosis not present

## 2018-09-16 DIAGNOSIS — S299XXA Unspecified injury of thorax, initial encounter: Secondary | ICD-10-CM | POA: Diagnosis not present

## 2018-09-16 DIAGNOSIS — R0781 Pleurodynia: Secondary | ICD-10-CM | POA: Diagnosis not present

## 2018-09-16 DIAGNOSIS — Z79899 Other long term (current) drug therapy: Secondary | ICD-10-CM | POA: Diagnosis not present

## 2018-09-16 DIAGNOSIS — Y929 Unspecified place or not applicable: Secondary | ICD-10-CM | POA: Diagnosis not present

## 2018-09-16 DIAGNOSIS — S2232XA Fracture of one rib, left side, initial encounter for closed fracture: Secondary | ICD-10-CM

## 2018-09-16 DIAGNOSIS — F17210 Nicotine dependence, cigarettes, uncomplicated: Secondary | ICD-10-CM | POA: Insufficient documentation

## 2018-09-16 DIAGNOSIS — Y939 Activity, unspecified: Secondary | ICD-10-CM | POA: Insufficient documentation
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
## 2018-12-18 ENCOUNTER — Telehealth: Payer: Self-pay

## 2018-12-18 ENCOUNTER — Other Ambulatory Visit: Payer: Self-pay

## 2018-12-18 DIAGNOSIS — K7031 Alcoholic cirrhosis of liver with ascites: Secondary | ICD-10-CM

## 2018-12-18 NOTE — Telephone Encounter (Signed)
-----   Message from Roetta Sessions, [place] sent at 06/14/2018  8:22 AM EDT ----- Regarding: repeat U/S and labs Abd U/S RUQ due in January 2020 for cirrhosis.

## 2018-12-18 NOTE — Progress Notes (Unsigned)
Pt due for RUQ U/S in January and past due for AFP lab in October 2019.

## 2018-12-18 NOTE — Telephone Encounter (Signed)
error 

## 2018-12-23 NOTE — Telephone Encounter (Signed)
Orders for U/S and AFP lab are entered.  LM for pt to call back to schedule U/S and can have lab work done on same day.

## 2018-12-25 ENCOUNTER — Emergency Department (HOSPITAL_COMMUNITY): Payer: Medicaid Other

## 2018-12-25 ENCOUNTER — Emergency Department (HOSPITAL_COMMUNITY)
Admission: EM | Admit: 2018-12-25 | Discharge: 2018-12-25 | Disposition: A | Discharge status: Home / Self Care | Payer: Medicaid Other | Attending: Emergency Medicine | Admitting: Emergency Medicine

## 2018-12-25 ENCOUNTER — Other Ambulatory Visit: Payer: Self-pay

## 2018-12-25 ENCOUNTER — Encounter (HOSPITAL_COMMUNITY): Payer: Self-pay | Admitting: *Deleted

## 2018-12-25 DIAGNOSIS — M5442 Lumbago with sciatica, left side: Secondary | ICD-10-CM | POA: Diagnosis not present

## 2018-12-25 DIAGNOSIS — F17210 Nicotine dependence, cigarettes, uncomplicated: Secondary | ICD-10-CM | POA: Diagnosis not present

## 2018-12-25 DIAGNOSIS — G8929 Other chronic pain: Secondary | ICD-10-CM | POA: Diagnosis not present

## 2018-12-25 DIAGNOSIS — S20212A Contusion of left front wall of thorax, initial encounter: Secondary | ICD-10-CM | POA: Diagnosis not present

## 2018-12-25 DIAGNOSIS — M545 Low back pain: Secondary | ICD-10-CM | POA: Diagnosis not present

## 2018-12-25 DIAGNOSIS — Z79899 Other long term (current) drug therapy: Secondary | ICD-10-CM | POA: Diagnosis not present

## 2018-12-25 DIAGNOSIS — R0781 Pleurodynia: Secondary | ICD-10-CM | POA: Diagnosis not present

## 2018-12-25 DIAGNOSIS — R079 Chest pain, unspecified: Secondary | ICD-10-CM | POA: Diagnosis present

## 2018-12-25 MED ORDER — LIDOCAINE 5 % EX PTCH
1.0000 | MEDICATED_PATCH | CUTANEOUS | Status: DC
  Administered 2018-12-25: 1 via TRANSDERMAL
  Filled 2018-12-25: qty 1

## 2018-12-25 NOTE — Discharge Instructions (Signed)
Please follow up with your doctor Try lidocaine patch for pain which are over the counter Return if worsening
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
## 2018-12-27 NOTE — Telephone Encounter (Signed)
Called and LM for pt to call back.

## 2018-12-30 NOTE — Telephone Encounter (Signed)
LM for pt to call back to discuss scheduling U/S and having labs done.  Patient's mobile number does not allow for leaving a message.  Mailing pt letter today asking him to call to discuss scheduling.

## 2019-01-13 ENCOUNTER — Other Ambulatory Visit: Payer: Self-pay | Admitting: Gastroenterology

## 2019-01-14 ENCOUNTER — Encounter: Payer: Self-pay | Admitting: Gastroenterology

## 2019-02-01 ENCOUNTER — Other Ambulatory Visit: Payer: Self-pay | Admitting: Gastroenterology

## 2019-02-18 ENCOUNTER — Ambulatory Visit: Payer: Medicaid Other | Admitting: Gastroenterology

## 2019-02-24 ENCOUNTER — Encounter (INDEPENDENT_AMBULATORY_CARE_PROVIDER_SITE_OTHER): Payer: Self-pay

## 2019-02-24 ENCOUNTER — Ambulatory Visit (INDEPENDENT_AMBULATORY_CARE_PROVIDER_SITE_OTHER): Payer: Medicaid Other | Admitting: Gastroenterology

## 2019-02-24 ENCOUNTER — Ambulatory Visit: Payer: Medicaid Other | Admitting: Gastroenterology

## 2019-02-24 ENCOUNTER — Telehealth: Payer: Self-pay

## 2019-02-24 ENCOUNTER — Encounter: Payer: Self-pay | Admitting: Gastroenterology

## 2019-02-24 ENCOUNTER — Other Ambulatory Visit: Payer: Self-pay

## 2019-02-24 VITALS — BP 132/80 | HR 76 | Temp 98.3°F | Ht 68.0 in | Wt 157.0 lb

## 2019-02-24 DIAGNOSIS — K7031 Alcoholic cirrhosis of liver with ascites: Secondary | ICD-10-CM | POA: Diagnosis not present

## 2019-02-24 DIAGNOSIS — I8500 Esophageal varices without bleeding: Secondary | ICD-10-CM

## 2019-02-24 NOTE — Progress Notes (Signed)
HPI :  53 y/o male with a history of cirrhosis suspected due to alcoholism, here for a follow up visit.  Previously he had labs done to exclude other chronic liver diseases. He had a  (+) SMA, IgG to 4184, ANA 1:80. 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 Second opinion done at [facility] which showed alcohol as the likely etiology and less likely autoimmune hep. Saw Dr. Precious Gilding office for second opinion, who also thought he had alcoholic cirrhosis.   He's not seen [place] since June 2019. At that time I had recommended he completely abstain from alcohohol, and put him back on propranolol 20mg  BID for history of small esophageal varices. He has tolerated the propranolol better than nadolol and has been compliant with it. He is tolerating lasix 40mg  once daily and aldactone 100mg  daily. This has controlled his edema and ascites well, no abdominal pains. He continues to drink alcohol, states he has a beer or two on the weekends, nothing during the week. He thinks this is much better than previous although still having a hard time coping with death of his family.  He is otherwise eating okay. He denies any complaints today otherwise, functioning well currently. He last had [facility] screening on 06/11/2018 which showed stable changes of cirrhosis. He was recommended to have hep A and B series in the past but can't see documentation of where he had it.  EGD 08/04/2016 - 3cm HH, small esophageal varices, portal hypertensive gastritis - , H pylori negative Colonoscopy 08/04/2016 - 3 small polyps, large internal hemorrhoids - 3 adenomas - recall 07/2019  Past Medical History:  Diagnosis Date  . Alcohol use 1985  . Alcohol withdrawal seizure ([place])   . Alcoholic cirrhosis of liver with ascites ([place]) 2017  . 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 Arthritis   . Migraine 1984     Past Surgical History:  Procedure Laterality Date  . [place]   Pt. reports rods placed in back  . IR GENERIC HISTORICAL  07/17/2016   IR VENOGRAM HEPATIC WO HEMODYNAMIC EVALUATION 07/17/2016 Aletta Edouard, MD [facility]  . IR GENERIC HISTORICAL  07/17/2016   IR TRANSCATHETER BX 07/17/2016 Aletta Edouard, MD [facility]  . IR GENERIC HISTORICAL  07/17/2016   IR US GUIDE BX ASP/DRAIN 07/17/2016 Aletta Edouard, MD [facility]  . IR GENERIC HISTORICAL  07/13/2016   IR ANGIO INTRA EXTRACRAN SEL COM CAROTID INNOMINATE BILAT MOD SED 07/13/2016 Luanne Bras, MD [facility]  . IR GENERIC HISTORICAL  07/13/2016   IR ANGIO VERTEBRAL SEL SUBCLAVIAN INNOMINATE BILAT MOD SED 07/13/2016 Luanne Bras, MD [facility]   Family History  Problem Relation Age of Onset  . Hypertension Mother   . Alzheimer's disease Mother   . Cancer Mother   . Alcoholism Father        deceased  . Colon cancer Neg Hx   . Esophageal cancer Neg Hx   . Stomach cancer Neg Hx   . Rectal cancer Neg Hx    Social History   Tobacco Use  . Smoking status: Current Some Day Smoker    Packs/day: 0.00    Years: 30.00    Pack years: 0.00    Types: Cigars  . Smokeless tobacco: Never Used  . Tobacco comment: occasional cigar use 1x/week  Substance Use Topics  . Alcohol use: Yes    Alcohol/week: 7.0 standard drinks    Types:  7 Cans of beer per week    Comment: few x a week    . Drug use: Yes    Types: Marijuana    Comment: daily    Current Outpatient Medications  Medication Sig Dispense Refill  . furosemide (LASIX) 40 MG tablet Take 1 tablet (40 mg total) by mouth daily. 30 tablet 5  . propranolol (INDERAL) 20 MG tablet Take 1 tablet (20 mg total) by mouth 2 (two) times daily. (Patient taking differently: Take 20 mg by mouth daily. ) 60 tablet 5  . spironolactone (ALDACTONE) 100 MG tablet Take 1 tablet (100 mg total) by mouth daily. Please keep your appt with Dr. Duanne Guess on Tuesday, 3-10 at 8:45am 30 tablet 5   No current facility-administered medications for this  visit.    No Known Allergies   Review of Systems: All systems reviewed and negative except where noted in HPI.   Lab Results  Component Value Date   WBC 3.8 (L) 05/03/2018   HGB 11.8 (L) 05/03/2018   HCT 35.1 (L) 05/03/2018   MCV 85.7 05/03/2018   PLT 179.0 05/03/2018    Lab Results  Component Value Date   CREATININE 0.61 05/03/2018   BUN 5 (L) 05/03/2018   NA 134 (L) 05/03/2018   K 3.7 05/03/2018   CL 100 05/03/2018   CO2 23 05/03/2018    Lab Results  Component Value Date   ALT 35 05/03/2018   AST 59 (H) 05/03/2018   ALKPHOS 57 05/03/2018   BILITOT 0.6 05/03/2018     Physical Exam: BP 132/80   Pulse 76   Temp 98.3 F (36.8 C)   Ht 5\' 8"  (1.727 m)   Wt 157 lb (71.2 kg)   BMI 23.87 kg/m  Constitutional: Pleasant,well-developed, male in no acute distress. HEENT: Normocephalic and atraumatic. Conjunctivae are normal. No scleral icterus. Neck supple.  Cardiovascular: Normal rate, regular rhythm.  Pulmonary/chest: Effort normal and breath sounds normal. No wheezing, rales or rhonchi. Abdominal: Soft, nondistended, nontender.  There are no masses palpable. No hepatomegaly. Extremities: no edema Lymphadenopathy: No cervical adenopathy noted. Neurological: Alert and oriented to person place and time. Skin: Skin is warm and dry. No rashes noted. Psychiatric: Normal mood and affect. Behavior is normal.   ASSESSMENT AND PLAN: 53 year old male here for reassessment following issues:  Alcoholic cirrhosis / esophageal varices - history of ascites and varices. Managed with diuretics and now on propranolol and tolerating it well. He does not warrants surveillance EGD if he remains on propranolol. No edema on exam today. I counseled him extensively that he should completely abstain from alcohol to minimize his risk for further decompensation and HCC. He acknowledges this and understands but having a hard time stopping completely but will continue to work on it. He is due  for basic labs today, last done 10 months ago. He can't stay to get labs done today but will come back to do it in the near future. He is also due for [facility] screening. He can't stay to schedule that either, provided a phone number and address to contact him for scheduling. He needs hep A and B vaccine unless documented elsewhere, could not stay for that either. I emphasized the importance of compliance with seeing me every 6 months and abstaining from alcohol and following through with plans as outlined, has been hard for him to do. Otherwise due for surveillance colonoscopy this summer. He agreed.  Claflin Cellar, MD [facility]  CC: Bufford Lope, DO

## 2019-02-24 NOTE — Telephone Encounter (Signed)
Multiple attempts made to contact pt to let him know his RUQ ultrasound has been scheduled for Tuesday, 3-24 at 10:00am at [facility]. NPO after midnight.  Tried both numbers for pt's girlfriend that he has provided:  [phone] and [phone] and no answer on either one.  No way to leave a message.  LM on home number.

## 2019-02-24 NOTE — Patient Instructions (Addendum)
If you are age 52 or older, your body mass index should be between 23-30. Your Body mass index is 23.87 kg/m. If this is out of the aforementioned range listed, please consider follow up with your Primary Care Provider.  If you are age 49 or younger, your body mass index should be between 19-25. Your Body mass index is 23.87 kg/m. If this is out of the aformentioned range listed, please consider follow up with your Primary Care Provider.   Please go to the lab in the basement of our building to have lab work done as you leave today. Hit "B" for basement when you get on the elevator.  When the doors open the lab is on your left.  We will call you with the results. Thank you.  You have been scheduled for an abdominal ultrasound at [facility] (1st floor of hospital) on Tuesday, 03-04-2019 at 10:00am. Please arrive 15 minutes prior to your appointment for registration. Make certain not to have anything to eat or drink 6 hours prior to your appointment. Should you need to reschedule your appointment, please contact radiology at [phone]. This test typically takes about 30 minutes to perform.  Thank you for entrusting me with your care and for choosing [facility], Dr. [place] Cellar

## 2019-02-27 ENCOUNTER — Telehealth: Payer: Self-pay

## 2019-02-27 NOTE — Telephone Encounter (Signed)
Yes okay to delay this exam thanks

## 2019-02-27 NOTE — Telephone Encounter (Signed)
Donavan Foil, Radiology at [facility] is calling to get your approval to reschedule this pt's Ultrasound for 4 weeks due to the current Covid-19 situation. He is currently scheduled for next Tuesday, 3-24. Please advise

## 2019-03-04 ENCOUNTER — Ambulatory Visit (HOSPITAL_COMMUNITY): Payer: Medicaid Other

## 2019-05-08 ENCOUNTER — Emergency Department (HOSPITAL_COMMUNITY)
Admission: EM | Admit: 2019-05-08 | Discharge: 2019-05-08 | Disposition: A | Discharge status: Home / Self Care | Payer: Medicaid Other | Attending: Emergency Medicine | Admitting: Emergency Medicine

## 2019-05-08 ENCOUNTER — Emergency Department (HOSPITAL_COMMUNITY)
Admission: EM | Admit: 2019-05-08 | Discharge: 2019-05-08 | Disposition: A | Discharge status: Home / Self Care | Payer: Medicaid Other | Source: Home / Self Care | Attending: Emergency Medicine | Admitting: Emergency Medicine

## 2019-05-08 ENCOUNTER — Other Ambulatory Visit: Payer: Self-pay

## 2019-05-08 ENCOUNTER — Emergency Department (HOSPITAL_COMMUNITY): Payer: Medicaid Other

## 2019-05-08 ENCOUNTER — Encounter (HOSPITAL_COMMUNITY): Payer: Self-pay | Admitting: Emergency Medicine

## 2019-05-08 DIAGNOSIS — K7460 Unspecified cirrhosis of liver: Secondary | ICD-10-CM | POA: Diagnosis not present

## 2019-05-08 DIAGNOSIS — F17290 Nicotine dependence, other tobacco product, uncomplicated: Secondary | ICD-10-CM | POA: Diagnosis not present

## 2019-05-08 DIAGNOSIS — F1290 Cannabis use, unspecified, uncomplicated: Secondary | ICD-10-CM | POA: Insufficient documentation

## 2019-05-08 DIAGNOSIS — R112 Nausea with vomiting, unspecified: Secondary | ICD-10-CM | POA: Insufficient documentation

## 2019-05-08 DIAGNOSIS — R5381 Other malaise: Secondary | ICD-10-CM | POA: Diagnosis not present

## 2019-05-08 DIAGNOSIS — R945 Abnormal results of liver function studies: Secondary | ICD-10-CM | POA: Diagnosis not present

## 2019-05-08 DIAGNOSIS — Z79899 Other long term (current) drug therapy: Secondary | ICD-10-CM | POA: Insufficient documentation

## 2019-05-08 DIAGNOSIS — R1011 Right upper quadrant pain: Secondary | ICD-10-CM | POA: Insufficient documentation

## 2019-05-08 DIAGNOSIS — R066 Hiccough: Secondary | ICD-10-CM | POA: Diagnosis present

## 2019-05-08 DIAGNOSIS — R748 Abnormal levels of other serum enzymes: Secondary | ICD-10-CM

## 2019-05-08 DIAGNOSIS — R509 Fever, unspecified: Secondary | ICD-10-CM | POA: Insufficient documentation

## 2019-05-08 LAB — COMPREHENSIVE METABOLIC PANEL
ALT: 54 U/L — ABNORMAL HIGH (ref 0–44)
AST: 91 U/L — ABNORMAL HIGH (ref 15–41)
Albumin: 3.6 g/dL (ref 3.5–5.0)
Alkaline Phosphatase: 65 U/L (ref 38–126)
Anion gap: 12 (ref 5–15)
BUN: <5 mg/dL — ABNORMAL LOW (ref 6–20)
CO2: 26 mmol/L (ref 22–32)
Calcium: 8.7 mg/dL — ABNORMAL LOW (ref 8.9–10.3)
Chloride: 95 mmol/L — ABNORMAL LOW (ref 98–111)
Creatinine, Ser: 0.57 mg/dL — ABNORMAL LOW (ref 0.61–1.24)
GFR calc Af Amer: >60 mL/min (ref >60)
GFR calc non Af Amer: >60 mL/min (ref >60)
Glucose, Bld: 129 mg/dL — ABNORMAL HIGH (ref 70–99)
Potassium: 3.4 mmol/L — ABNORMAL LOW (ref 3.5–5.1)
Sodium: 133 mmol/L — ABNORMAL LOW (ref 135–145)
Total Bilirubin: 1 mg/dL (ref 0.3–1.2)
Total Protein: 8.8 g/dL — ABNORMAL HIGH (ref 6.5–8.1)

## 2019-05-08 LAB — CBC WITH DIFFERENTIAL/PLATELET
Abs Immature Granulocytes: 0.03 10*3/uL (ref 0.00–0.07)
Basophils Absolute: 0.1 10*3/uL (ref 0.0–0.1)
Basophils Relative: 1 %
Eosinophils Absolute: 0 10*3/uL (ref 0.0–0.5)
Eosinophils Relative: 0 %
HCT: 32.6 % — ABNORMAL LOW (ref 39.0–52.0)
Hemoglobin: 11.3 g/dL — ABNORMAL LOW (ref 13.0–17.0)
Immature Granulocytes: 0 %
Lymphocytes Relative: 10 %
Lymphs Abs: 0.7 10*3/uL (ref 0.7–4.0)
MCH: 28.3 pg (ref 26.0–34.0)
MCHC: 34.7 g/dL (ref 30.0–36.0)
MCV: 81.5 fL (ref 80.0–100.0)
Monocytes Absolute: 0.6 10*3/uL (ref 0.1–1.0)
Monocytes Relative: 8 %
Neutro Abs: 5.7 10*3/uL (ref 1.7–7.7)
Neutrophils Relative %: 81 %
Platelets: 101 10*3/uL — ABNORMAL LOW (ref 150–400)
RBC: 4 MIL/uL — ABNORMAL LOW (ref 4.22–5.81)
RDW: 15 % (ref 11.5–15.5)
WBC: 7 10*3/uL (ref 4.0–10.5)
nRBC: 0 % (ref 0.0–0.2)

## 2019-05-08 LAB — LIPASE, BLOOD: Lipase: 52 U/L — ABNORMAL HIGH (ref 11–51)

## 2019-05-08 LAB — TROPONIN I: Troponin I: <0.03 ng/mL (ref <0.03)

## 2019-05-08 MED ORDER — ONDANSETRON HCL 4 MG PO TABS: 4.0000 mg | ORAL_TABLET | Freq: Three times a day (TID) | ORAL | 0 refills | Status: DC | PRN

## 2019-05-08 MED ORDER — FAMOTIDINE IN NACL 20-0.9 MG/50ML-% IV SOLN
20.0000 mg | Freq: Once | INTRAVENOUS | Status: AC
  Administered 2019-05-08: 20 mg via INTRAVENOUS
  Filled 2019-05-08: qty 50

## 2019-05-08 MED ORDER — ALUM & MAG HYDROXIDE-SIMETH 200-200-20 MG/5ML PO SUSP
30.0000 mL | Freq: Once | ORAL | Status: AC
Start: 2019-05-08 — End: 2019-05-08
  Administered 2019-05-08: 06:00:00 30 mL via ORAL
  Filled 2019-05-08: qty 30

## 2019-05-08 MED ORDER — PANTOPRAZOLE SODIUM 40 MG IV SOLR
40.0000 mg | Freq: Once | INTRAVENOUS | Status: AC
  Administered 2019-05-08: 40 mg via INTRAVENOUS
  Filled 2019-05-08: qty 40

## 2019-05-08 MED ORDER — LIDOCAINE VISCOUS HCL 2 % MT SOLN
15.0000 mL | Freq: Once | OROMUCOSAL | Status: AC
  Administered 2019-05-08: 06:00:00 15 mL via ORAL
  Filled 2019-05-08: qty 15

## 2019-05-08 MED ORDER — ONDANSETRON 4 MG PO TBDP
4.0000 mg | ORAL_TABLET | Freq: Once | ORAL | Status: AC
  Administered 2019-05-08: 4 mg via ORAL
  Filled 2019-05-08: qty 1

## 2019-05-08 MED ORDER — METOCLOPRAMIDE HCL 5 MG/ML IJ SOLN
10.0000 mg | Freq: Once | INTRAMUSCULAR | Status: AC
  Administered 2019-05-08: 06:00:00 10 mg via INTRAVENOUS
  Filled 2019-05-08: qty 2

## 2019-05-08 NOTE — ED Notes (Addendum)
Seth Bake (sister) would like to be called for updates later [phone]

## 2019-05-08 NOTE — Discharge Instructions (Addendum)
Please call your liver doctor tomorrow to set up an appointment for evaluation as your liver enzymes have gotten worse since they were checked on prior labs.  Return to the emergency department for any new or worsening symptoms in the meantime.

## 2019-05-08 NOTE — ED Provider Notes (Signed)
[facility] Provider Note   CSN: [id number] Arrival date & time: 05/08/19  1007    History   Chief Complaint Chief Complaint  Patient presents with  . Emesis  . Hiccups    HPI Stephen Carroll is a 53 y.o. male.     HPI   Patient is a 53 year old male with a history of EtOH use, alcohol withdrawal seizure, cirrhosis, cerebral aneurysm, who presents to the emergency department today for evaluation of hiccups, nausea and vomiting.  He states that he was here earlier this AM for eval for similar. States he ate some meat and it caused him to have hiccups, NV. He was given meds in the ED and sxs resolved so he left. He denies abd pain. His hiccups are still resolved. He states after he left the ED he ate bojangles and vomited again.   Reviewed records.  Patient was seen in the ED last night for similar symptoms.  Stated that he developed hiccups after eating spicy meat.  He had also admitted to alcohol use last night as well.  His symptoms resolved after Protonix and Reglan.  Labs were drawn for screening purposes to ensure no concerning cause of symptoms however patient decided to elope from the ED prior to completion of his work-up because he was feeling better.  Reviewed labs from yesterday.  CBC showed no leukocytosis.  CMP showed some mild electrolyte derangement and elevated liver enzymes with AST at 91 and ALT of 54.  He does have a history of this but levels are slightly worse today.  Bilirubin is normal.  Lipase is negative and troponin was negative.  Past Medical History:  Diagnosis Date  . Alcohol use 1985  . Alcohol withdrawal seizure ([place])   . Alcoholic cirrhosis of liver with ascites ([place]) 2017  . 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 Arthritis   . Migraine 1984    Patient Active Problem List   Diagnosis Date Noted  . Herpes zoster without complication 38/09/1750  . Tobacco use 07/01/2017  . Marijuana use 07/01/2017  .  Low back pain 05/29/2017  . Rib contusion, left, subsequent encounter 05/22/2017  . Alcoholic cirrhosis of liver with ascites ([place])   . Alcohol withdrawal seizure ([place])   . 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 Alcohol withdrawal ([place]) 08/07/2015  . Alcohol abuse 08/07/2015    Past Surgical History:  Procedure Laterality Date  . [place]   Pt. reports rods placed in back  . IR GENERIC HISTORICAL  07/17/2016   IR VENOGRAM HEPATIC WO HEMODYNAMIC EVALUATION 07/17/2016 Aletta Edouard, MD [facility]  . IR GENERIC HISTORICAL  07/17/2016   IR TRANSCATHETER BX 07/17/2016 Aletta Edouard, MD [facility]  . IR GENERIC HISTORICAL  07/17/2016   IR US GUIDE BX ASP/DRAIN 07/17/2016 Aletta Edouard, MD [facility]  . IR GENERIC HISTORICAL  07/13/2016   IR ANGIO INTRA EXTRACRAN SEL COM CAROTID INNOMINATE BILAT MOD SED 07/13/2016 Luanne Bras, MD [facility]  . IR GENERIC HISTORICAL  07/13/2016   IR ANGIO VERTEBRAL SEL SUBCLAVIAN INNOMINATE BILAT MOD SED 07/13/2016 Luanne Bras, MD [facility]        Home Medications    Prior to Admission medications   Medication Sig Start Date End Date Taking? Authorizing Provider  furosemide (LASIX) 40 MG tablet Take 1 tablet (40 mg total) by mouth daily. 05/13/18   Armbruster, Carlota Raspberry, MD  ondansetron (ZOFRAN) 4 MG tablet Take 1 tablet (4 mg total)  by mouth every 8 (eight) hours as needed for nausea or vomiting. 05/08/19   Darik Massing S, PA-C  propranolol (INDERAL) 20 MG tablet Take 1 tablet (20 mg total) by mouth 2 (two) times daily. Patient taking differently: Take 20 mg by mouth daily.  05/13/18   Armbruster, Carlota Raspberry, MD  spironolactone (ALDACTONE) 100 MG tablet Take 1 tablet (100 mg total) by mouth daily. Please keep your appt with Dr. Duanne Guess on Tuesday, 3-10 at 8:45am 02/03/19   Armbruster, Carlota Raspberry, MD    Family History Family History  Problem Relation Age of Onset  . Hypertension Mother   . Alzheimer's disease Mother   .  Cancer Mother   . Alcoholism Father        deceased  . Colon cancer Neg Hx   . Esophageal cancer Neg Hx   . Stomach cancer Neg Hx   . Rectal cancer Neg Hx     Social History Social History   Tobacco Use  . Smoking status: Current Some Day Smoker    Packs/day: 0.00    Years: 30.00    Pack years: 0.00    Types: Cigars  . Smokeless tobacco: Never Used  . Tobacco comment: occasional cigar use 1x/week  Substance Use Topics  . Alcohol use: Yes    Alcohol/week: 7.0 standard drinks    Types: 7 Cans of beer per week    Comment: few x a week    . Drug use: Yes    Types: Marijuana    Comment: daily      Allergies   Patient has no known allergies.   Review of Systems Review of Systems  Constitutional: Positive for fever.  HENT: Negative for ear pain and sore throat.   Eyes: Negative for visual disturbance.  Respiratory: Negative for cough and shortness of breath.   Cardiovascular: Negative for chest pain.  Gastrointestinal: Positive for nausea and vomiting. Negative for abdominal pain, constipation and diarrhea.  Genitourinary: Negative for dysuria and hematuria.  Musculoskeletal: Negative for back pain.  Skin: Negative for color change and rash.  Neurological: Negative for headaches.  All other systems reviewed and are negative.  Physical Exam Updated Vital Signs BP (!) 154/93   Pulse (!) 105   Temp 99.7 F (37.6 C) (Oral)   Resp 20   SpO2 100%   Physical Exam Vitals signs and nursing note reviewed.  Constitutional:      General: He is not in acute distress.    Appearance: He is well-developed. He is not ill-appearing or toxic-appearing.  HENT:     Head: Normocephalic and atraumatic.  Eyes:     Conjunctiva/sclera: Conjunctivae normal.  Neck:     Musculoskeletal: Neck supple.  Cardiovascular:     Rate and Rhythm: Regular rhythm. Tachycardia present.     Heart sounds: No murmur.  Pulmonary:     Effort: Pulmonary effort is normal. No respiratory distress.      Breath sounds: Normal breath sounds. No wheezing, rhonchi or rales.  Abdominal:     General: Bowel sounds are normal. There is no distension.     Palpations: Abdomen is soft.     Tenderness: There is no abdominal tenderness. There is no guarding or rebound.  Skin:    General: Skin is warm and dry.  Neurological:     Mental Status: He is alert.      ED Treatments / Results  Labs (all labs ordered are listed, but only abnormal results are displayed) Labs Reviewed -  No data to display  EKG None  Radiology US Abdomen Limited Ruq  Result Date: 05/08/2019 CLINICAL DATA:  Right upper quadrant pain.  Cirrhosis of the liver. EXAM: ULTRASOUND ABDOMEN LIMITED RIGHT UPPER QUADRANT COMPARISON:  Ultrasound right upper quadrant 06/11/2018 FINDINGS: Gallbladder: No gallstones or wall thickening visualized. No sonographic Murphy sign noted by sonographer. Common bile duct: Diameter: 3.6 mm Liver: Diffusely echogenic liver without focal lesion. No ascites right upper quadrant. Portal vein is patent on color Doppler imaging with normal direction of blood flow towards the liver. IMPRESSION: Negative for gallstones Echogenic liver similar to the prior ultrasound. Electronically Signed   By: Franchot Gallo M.D.   On: 05/08/2019 11:28    Procedures Procedures (including critical care time)  Medications Ordered in ED Medications  ondansetron (ZOFRAN-ODT) disintegrating tablet 4 mg (4 mg Oral Given 05/08/19 1042)     Initial Impression / Assessment and Plan / ED Course  I have reviewed the triage vital signs and the nursing notes.  Pertinent labs & imaging results that were available during my care of the patient were reviewed by me and considered in my medical decision making (see chart for details).   Final Clinical Impressions(s) / ED Diagnoses   Final diagnoses:  RUQ pain  Non-intractable vomiting with nausea, unspecified vomiting type  Elevated liver enzymes   Patient is a 53 year old  male with a history of EtOH use, alcohol withdrawal seizure, cirrhosis, c/o hiccups, nausea and vomiting after eating spicy meat. Was in the ED this AM for same complaint. He felt better after meds so he eloped prior to d/c. States he left and ate bojangles and vomited again. No abd pain. Has not been febrile but was noted to have borderline temp while in the ED earlier this AM.   Reviewed records and labs from yesterday. CBC showed no leukocytosis.  CMP showed some mild electrolyte derangement and elevated liver enzymes with AST at 91 and ALT of 54.  He does have a history of this but levels are slightly worse today.  Bilirubin is normal.  Lipase is grossly normal and troponin was negative.  Given additional episode of vomiting after fried foods, borderline temp, and abnormal LFTs will obtain RUQ [place] to r/o gallbladder pathology.   RUQ [place] is negative for gallstones or any signs of cholecystitis.  On reevaluation patient feels improved.  Heart rate 90s on monitor.  He has been able to tolerate p.o.  I feel that he is appropriate for discharge.  I suspect that his nausea and vomiting is secondary to a viral syndrome.  I will give him Rx for Zofran for home and advised him to follow-up with his GI physician in regards to his elevated liver enzymes.  He states he will call the office tomorrow.  Also discussed return precautions.  He voiced understanding and reasons to return.  Questions answered.  Patient stable at discharge.  ED Discharge Orders         Ordered    ondansetron (ZOFRAN) 4 MG tablet  Every 8 hours PRN     05/08/19 1147           Sinia Antosh S, PA-C 05/08/19 1147    Elnora Morrison, MD 05/08/19 1552

## 2019-05-08 NOTE — ED Notes (Signed)
Pt anxious, pacing halls stating he is ready to leave. Suggested he needs to await results and speak to Dr, but pt decided to leave

## 2019-05-08 NOTE — ED Triage Notes (Signed)
Pt co vomiting and hiccups..the patient says he was here this morning for the same thing

## 2019-05-08 NOTE — ED Notes (Signed)
Pts Sister called wanting an update..... Minerva Areola [phone]

## 2019-05-08 NOTE — ED Provider Notes (Signed)
[facility] Provider Note   CSN: [id number] Arrival date & time: 05/08/19  0414    History   Chief Complaint Chief Complaint  Patient presents with  . Hiccups    HPI Stephen Carroll is a 53 y.o. male.     53 year old male with history of alcoholic cirrhosis presents to the emergency department for complaints of hiccups.  His symptoms began 2 to 3 hours ago after eating spicy meat and have remained constant, unchanged.  Became hungry after use of marijuana.  Also had a cold beer tonight.  He has not taken any medications to try and mitigate his hiccups.  Has been able to tolerate this food in the past without issue.  He has no complaints of chest pain, shortness of breath, abdominal pain, vomiting.     Past Medical History:  Diagnosis Date  . Alcohol use 1985  . Alcohol withdrawal seizure ([place])   . Alcoholic cirrhosis of liver with ascites ([place]) 2017  . 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 Arthritis   . Migraine 1984    Patient Active Problem List   Diagnosis Date Noted  . Herpes zoster without complication 71/69/6789  . Tobacco use 07/01/2017  . Marijuana use 07/01/2017  . Low back pain 05/29/2017  . Rib contusion, left, subsequent encounter 05/22/2017  . Alcoholic cirrhosis of liver with ascites ([place])   . Alcohol withdrawal seizure ([place])   . 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 Alcohol withdrawal ([place]) 08/07/2015  . Alcohol abuse 08/07/2015    Past Surgical History:  Procedure Laterality Date  . [place]   Pt. reports rods placed in back  . IR GENERIC HISTORICAL  07/17/2016   IR VENOGRAM HEPATIC WO HEMODYNAMIC EVALUATION 07/17/2016 Aletta Edouard, MD [facility]  . IR GENERIC HISTORICAL  07/17/2016   IR TRANSCATHETER BX 07/17/2016 Aletta Edouard, MD [facility]  . IR GENERIC HISTORICAL  07/17/2016   IR US GUIDE BX ASP/DRAIN 07/17/2016 Aletta Edouard, MD [facility]  . IR GENERIC HISTORICAL   07/13/2016   IR ANGIO INTRA EXTRACRAN SEL COM CAROTID INNOMINATE BILAT MOD SED 07/13/2016 Luanne Bras, MD [facility]  . IR GENERIC HISTORICAL  07/13/2016   IR ANGIO VERTEBRAL SEL SUBCLAVIAN INNOMINATE BILAT MOD SED 07/13/2016 Luanne Bras, MD [facility]        Home Medications    Prior to Admission medications   Medication Sig Start Date End Date Taking? Authorizing Provider  furosemide (LASIX) 40 MG tablet Take 1 tablet (40 mg total) by mouth daily. 05/13/18   Armbruster, Carlota Raspberry, MD  propranolol (INDERAL) 20 MG tablet Take 1 tablet (20 mg total) by mouth 2 (two) times daily. Patient taking differently: Take 20 mg by mouth daily.  05/13/18   Armbruster, Carlota Raspberry, MD  spironolactone (ALDACTONE) 100 MG tablet Take 1 tablet (100 mg total) by mouth daily. Please keep your appt with Dr. Duanne Guess on Tuesday, 3-10 at 8:45am 02/03/19   Armbruster, Carlota Raspberry, MD    Family History Family History  Problem Relation Age of Onset  . Hypertension Mother   . Alzheimer's disease Mother   . Cancer Mother   . Alcoholism Father        deceased  . Colon cancer Neg Hx   . Esophageal cancer Neg Hx   . Stomach cancer Neg Hx   . Rectal cancer Neg Hx     Social History Social History   Tobacco Use  . Smoking status: Current  Some Day Smoker    Packs/day: 0.00    Years: 30.00    Pack years: 0.00    Types: Cigars  . Smokeless tobacco: Never Used  . Tobacco comment: occasional cigar use 1x/week  Substance Use Topics  . Alcohol use: Yes    Alcohol/week: 7.0 standard drinks    Types: 7 Cans of beer per week    Comment: few x a week    . Drug use: Yes    Types: Marijuana    Comment: daily      Allergies   Patient has no known allergies.   Review of Systems Review of Systems Ten systems reviewed and are negative for acute change, except as noted in the HPI.    Physical Exam Updated Vital Signs BP (!) 163/88 (BP Location: Left Arm)   Pulse (!) 106   Temp (!) 100.4 F (38 C)  (Rectal)   Resp 18   Wt 71.2 kg   SpO2 98%   BMI 23.87 kg/m   Physical Exam Vitals signs and nursing note reviewed.  Constitutional:      General: He is not in acute distress.    Appearance: He is well-developed. He is not diaphoretic.     Comments: Hiccups noted. Patient in NAD.  HENT:     Head: Normocephalic and atraumatic.  Eyes:     General: No scleral icterus.    Conjunctiva/sclera: Conjunctivae normal.  Neck:     Musculoskeletal: Normal range of motion.  Cardiovascular:     Rate and Rhythm: Regular rhythm. Tachycardia present.     Pulses: Normal pulses.     Comments: Intermittently tachycardic with heart rate between 90 and 110bpm. Pulmonary:     Effort: Pulmonary effort is normal. No respiratory distress.     Comments: Respirations even and unlabored Abdominal:     Comments: Soft, nontender abdomen.  Musculoskeletal: Normal range of motion.  Skin:    General: Skin is warm and dry.     Coloration: Skin is not pale.     Findings: No erythema or rash.  Neurological:     General: No focal deficit present.     Mental Status: He is alert and oriented to person, place, and time.     Coordination: Coordination normal.     Comments: GCS 15.  Patient ambulatory with steady gait.  Psychiatric:        Behavior: Behavior normal.      ED Treatments / Results  Labs (all labs ordered are listed, but only abnormal results are displayed) Labs Reviewed  CBC WITH DIFFERENTIAL/PLATELET - Abnormal; Notable for the following components:      Result Value   RBC 4.00 (*)    Hemoglobin 11.3 (*)    HCT 32.6 (*)    Platelets 101 (*)    All other components within normal limits  COMPREHENSIVE METABOLIC PANEL  LIPASE, BLOOD  TROPONIN I    EKG None  Radiology No results found.  Procedures Procedures (including critical care time)  Medications Ordered in ED Medications  metoCLOPramide (REGLAN) injection 10 mg (10 mg Intravenous Given 05/08/19 0536)  alum & mag  hydroxide-simeth (MAALOX/MYLANTA) 200-200-20 MG/5ML suspension 30 mL (30 mLs Oral Given 05/08/19 0535)    And  lidocaine (XYLOCAINE) 2 % viscous mouth solution 15 mL (15 mLs Oral Given 05/08/19 0535)  pantoprazole (PROTONIX) injection 40 mg (40 mg Intravenous Given 05/08/19 0535)  famotidine (PEPCID) IVPB 20 mg premix (20 mg Intravenous New Bag/Given 05/08/19 0536)  6:20 AM Patient resting comfortably. Hiccups have resolved.  6:52 AM RN reports that patient has left the emergency department.  He expressed that he was "ready to go".  Given pending labs, disposition set to eloped.  He was seen ambulating out of the department in stable condition.   Initial Impression / Assessment and Plan / ED Course  I have reviewed the triage vital signs and the nursing notes.  Pertinent labs & imaging results that were available during my care of the patient were reviewed by me and considered in my medical decision making (see chart for details).        53 year old male presents to the emergency department for hiccups which began after eating spicy meat tonight.  His symptoms have resolved with Protonix and Reglan.  Denies chest pain, shortness of breath, abdominal pain, vomiting.  Was noted to have a low-grade fever on arrival, but denies feeling ill recently.  He is nontoxic and pleasant.  Labs were drawn for screening purposes to ensure no concerning cause of symptoms tonight.  Decided to elope from the ED as he was feeling better.    Final Clinical Impressions(s) / ED Diagnoses   Final diagnoses:  Hiccups    ED Discharge Orders    None       Antonietta Breach, PA-C 05/08/19 6270    Fatima Blank, MD 05/13/19 [phone]

## 2019-05-08 NOTE — ED Notes (Signed)
Pt calling his sister now.

## 2019-05-08 NOTE — ED Notes (Signed)
Sister called for update--transferred to RN Anna--Stephen Carroll

## 2019-05-08 NOTE — ED Notes (Signed)
Pt back from US

## 2019-05-08 NOTE — ED Triage Notes (Signed)
Pt in with complaints of constant hiccups since last night - was unable to sleep. Denies any pain, has low grade fever 100.4

## 2019-06-03 ENCOUNTER — Other Ambulatory Visit (INDEPENDENT_AMBULATORY_CARE_PROVIDER_SITE_OTHER): Payer: Medicaid Other

## 2019-06-03 ENCOUNTER — Ambulatory Visit: Payer: Medicaid Other | Admitting: Gastroenterology

## 2019-06-03 VITALS — BP 122/72 | HR 72 | Temp 98.2°F | Ht 68.0 in | Wt 156.0 lb

## 2019-06-03 DIAGNOSIS — Z860100 Personal history of colon polyps, unspecified: Secondary | ICD-10-CM

## 2019-06-03 DIAGNOSIS — D649 Anemia, unspecified: Secondary | ICD-10-CM | POA: Diagnosis not present

## 2019-06-03 DIAGNOSIS — K7030 Alcoholic cirrhosis of liver without ascites: Secondary | ICD-10-CM

## 2019-06-03 DIAGNOSIS — F1020 Alcohol dependence, uncomplicated: Secondary | ICD-10-CM

## 2019-06-03 DIAGNOSIS — Z23 Encounter for immunization: Secondary | ICD-10-CM

## 2019-06-03 DIAGNOSIS — I8500 Esophageal varices without bleeding: Secondary | ICD-10-CM

## 2019-06-03 LAB — PROTIME-INR
INR: 1.3 ratio — ABNORMAL HIGH (ref 0.8–1.0)
Prothrombin Time: 14.6 s — ABNORMAL HIGH (ref 9.6–13.1)

## 2019-06-03 LAB — FOLATE: Folate: 10.3 ng/mL (ref >5.9)

## 2019-06-03 LAB — VITAMIN B12: Vitamin B-12: 759 pg/mL (ref 211–911)

## 2019-06-03 LAB — IBC + FERRITIN
Ferritin: 131.1 ng/mL (ref 22.0–322.0)
Iron: 186 ug/dL — ABNORMAL HIGH (ref 42–165)
Saturation Ratios: 42.6 % (ref 20.0–50.0)
Transferrin: 312 mg/dL (ref 212.0–360.0)

## 2019-06-03 LAB — TSH: TSH: 2.23 u[IU]/mL (ref 0.35–4.50)

## 2019-06-03 MED ORDER — ONDANSETRON HCL 4 MG PO TABS: 4.0000 mg | ORAL_TABLET | Freq: Three times a day (TID) | ORAL | 1 refills | Status: DC | PRN

## 2019-06-03 NOTE — Patient Instructions (Addendum)
If you are age 52 or older, your body mass index should be between 23-30. Your Body mass index is 23.72 kg/m. If this is out of the aforementioned range listed, please consider follow up with your Primary Care Provider.  If you are age 10 or younger, your body mass index should be between 19-25. Your Body mass index is 23.72 kg/m. If this is out of the aformentioned range listed, please consider follow up with your Primary Care Provider.   To help prevent the possible spread of infection to our patients, communities, and staff; we will be implementing the following measures:  As of now we are not allowing any visitors/family members to accompany you to any upcoming appointments with [facility]. If you have any concerns about this please contact our office to discuss prior to the appointment.   We have sent the following medications to your pharmacy for you to pick up at your convenience: Zofran 4mg  - take very 8 hours as needed  Please go to the lab in the basement of our building to have lab work done as you leave today. Hit "B" for basement when you get on the elevator.  When the doors open the lab is on your left.  We will call you with the results. Thank you.  We will refer you to [facility] for alcohol abstinence. They will contact you to schedule an appointment.  If you do not hear from them in the next 2 weeks please call us and let us know so we can follow up.    You will be due for your 2nd Twinrix injection to protect you from Hepatitis A and Hepatitis B on 07-04-2019 at 10:30am.   You will be due for a colonoscopy in August.  We will contact you when it is time to schedule that.   Thank you for entrusting me with your care and for choosing [facility], Dr. Oakley Cellar

## 2019-06-03 NOTE — Progress Notes (Signed)
HPI :  53 y/o male here for a follow up. 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 Previously he had labs done to exclude otherchronic liver diseases. He had a(+) SMA, IgG to 4184, ANA 1:80. This led to a liver biopsy being done BM8/03/1323 - showingalcoholic cirrhosis with possible autoimmune component. Second opinion done at [facility] which showed alcohol as the likely etiology and less likely autoimmune hep. Saw Dr. Precious Gilding office for second opinion, who also thought he had alcoholic cirrhosis.   I last saw him in March. I had recommended [place] for [place] screening, labs, vaccine to hep A and B. He did not do any of these, states he did not have enough time. He was seen in the ED recently for nausea and vomiting. He reports having nausea when he wakes up in the AM, uses Zofran which takes it away quickly and then does fine. He is eating well, no vomiting. He has continued to drink about 2 beers at night to help him go to sleep, he has not been able to cut back or stop. Previously drank hard liquor and wine, has had only beer recently. He states he is compliant with his medications otherwise. He has been feeling well. No edema, no ascites, no abdominal pains, no bowel changes. He needs vaccine to hep A and hep B. While in the ED recently he had a RUQ [place] which did not show any new changes of his liver. His AST continues to be elevated more than ALT, both < 100.  His last colonoscopy was 07/2016, had 3 small adenomas, due for a repeat colonoscopy 8/20. He denies any changes in his bowels. No blood in the stools. He has an ongoing anemia with Hgb in the 11s, MCV low 80s. He has not had lab workup for that recently.   [place] RUQ 05/08/19 - stable changes  EGD 08/04/2016 - 3cm HH, small esophageal varices, portal hypertensive gastritis - , H pylori negative Colonoscopy 08/04/2016 - 3 small polyps, large internal hemorrhoids - 3 adenomas - recall 07/2019   Past Medical History:  Diagnosis Date  . Alcohol use 1985  .  Alcohol withdrawal seizure ([place])   . Alcoholic cirrhosis of liver with ascites ([place]) 2017  . 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 Arthritis   . Migraine 1984     Past Surgical History:  Procedure Laterality Date  . [place]   Pt. reports rods placed in back  . IR GENERIC HISTORICAL  07/17/2016   IR VENOGRAM HEPATIC WO HEMODYNAMIC EVALUATION 07/17/2016 Aletta Edouard, MD [facility]  . IR GENERIC HISTORICAL  07/17/2016   IR TRANSCATHETER BX 07/17/2016 Aletta Edouard, MD [facility]  . IR GENERIC HISTORICAL  07/17/2016   IR US GUIDE BX ASP/DRAIN 07/17/2016 Aletta Edouard, MD [facility]  . IR GENERIC HISTORICAL  07/13/2016   IR ANGIO INTRA EXTRACRAN SEL COM CAROTID INNOMINATE BILAT MOD SED 07/13/2016 Luanne Bras, MD [facility]  . IR GENERIC HISTORICAL  07/13/2016   IR ANGIO VERTEBRAL SEL SUBCLAVIAN INNOMINATE BILAT MOD SED 07/13/2016 Luanne Bras, MD [facility]   Family History  Problem Relation Age of Onset  . Hypertension Mother   . Alzheimer's disease Mother   . Cancer Mother   . Alcoholism Father        deceased  . Colon cancer Neg Hx   . Esophageal cancer Neg Hx   . Stomach cancer Neg Hx   . Rectal cancer Neg Hx    Social History  Tobacco Use  . Smoking status: Current Some Day Smoker    Packs/day: 0.00    Years: 30.00    Pack years: 0.00    Types: Cigars  . Smokeless tobacco: Never Used  . Tobacco comment: occasional cigar use 1x/week  Substance Use Topics  . Alcohol use: Yes    Alcohol/week: 7.0 standard drinks    Types: 7 Cans of beer per week    Comment: few x a week    . Drug use: Yes    Types: Marijuana    Comment: daily    Current Outpatient Medications  Medication Sig Dispense Refill  . furosemide (LASIX) 40 MG tablet Take 1 tablet (40 mg total) by mouth daily. 30 tablet 5  . ondansetron (ZOFRAN) 4 MG tablet Take 1 tablet (4 mg total) by mouth every 8 (eight) hours as needed for nausea or vomiting. 6 tablet 0  . propranolol  (INDERAL) 20 MG tablet Take 1 tablet (20 mg total) by mouth 2 (two) times daily. (Patient taking differently: Take 20 mg by mouth daily. ) 60 tablet 5  . spironolactone (ALDACTONE) 100 MG tablet Take 1 tablet (100 mg total) by mouth daily. Please keep your appt with Dr. Duanne Guess on Tuesday, 3-10 at 8:45am 30 tablet 5   No current facility-administered medications for this visit.    No Known Allergies   Review of Systems: All systems reviewed and negative except where noted in HPI.    US Abdomen Limited Ruq  Result Date: 05/08/2019 CLINICAL DATA:  Right upper quadrant pain.  Cirrhosis of the liver. EXAM: ULTRASOUND ABDOMEN LIMITED RIGHT UPPER QUADRANT COMPARISON:  Ultrasound right upper quadrant 06/11/2018 FINDINGS: Gallbladder: No gallstones or wall thickening visualized. No sonographic Murphy sign noted by sonographer. Common bile duct: Diameter: 3.6 mm Liver: Diffusely echogenic liver without focal lesion. No ascites right upper quadrant. Portal vein is patent on color Doppler imaging with normal direction of blood flow towards the liver. IMPRESSION: Negative for gallstones Echogenic liver similar to the prior ultrasound. Electronically Signed   By: Franchot Gallo M.D.   On: 05/08/2019 11:28   Lab Results  Component Value Date   WBC 7.0 05/08/2019   HGB 11.3 (L) 05/08/2019   HCT 32.6 (L) 05/08/2019   MCV 81.5 05/08/2019   PLT 101 (L) 05/08/2019    Lab Results  Component Value Date   CREATININE 0.57 (L) 05/08/2019   BUN <5 (L) 05/08/2019   NA 133 (L) 05/08/2019   K 3.4 (L) 05/08/2019   CL 95 (L) 05/08/2019   CO2 26 05/08/2019    Lab Results  Component Value Date   ALT 54 (H) 05/08/2019   AST 91 (H) 05/08/2019   ALKPHOS 65 05/08/2019   BILITOT 1.0 05/08/2019      Physical Exam: BP 122/72 (BP Location: Left Arm, Patient Position: Sitting)   Pulse 72   Temp 98.2 F (36.8 C) (Oral)   Ht 5\' 8"  (1.727 m)   Wt 156 lb (70.8 kg)   SpO2 98%   BMI 23.72 kg/m   Constitutional: Pleasant,well-developed, male in no acute distress. HEENT: Normocephalic and atraumatic. Conjunctivae are normal. No scleral icterus. Neck supple.  Cardiovascular: Normal rate, regular rhythm.  Pulmonary/chest: Effort normal and breath sounds normal. No wheezing, rales or rhonchi. Abdominal: Soft, nondistended, nontender.  There are no masses palpable. No hepatomegaly. Extremities: no edema Lymphadenopathy: No cervical adenopathy noted. Neurological: Alert and oriented to person place and time. Skin: Skin is warm and dry. No rashes noted.  Psychiatric: Normal mood and affect. Behavior is normal.   ASSESSMENT AND PLAN: 53 y/o male here for reassessment of the following issues:  Alcoholic cirrhosis / esophageal varices - history of ascites and small varices. Unfortunately he continues to drink, we discussed this at length. I am concerned about his risk for further decompensation with ongoing alcohol use, recommend he abstain completely to minimize this risk and if he should ever need a transplant in the future. He was agreeable to referral to behavioral health for help with this, and will make referral. Otherwise, due for basic labs today, he was agreeable to that as well as vaccine to hep A and B. He is due for [facility] screening again in November with [place]. I refilled his Zofran for him to take for nausea, but again recommend alcohol cessation, I think he would feel better with that. His varices were small and he is taking propranolol, do not think he warrants further surveillance as long as he stays on on propranolol and is compliant. He agreed with the plan as outlined, follow up again in 6 months.   Anemia - slight worsening of chronic anemia recently, MCV 81. Will check iron stores, workup anemia, unclear how much of this is due to ongoing alcohol use, but would expect MCV to be higher in that situation. Will let him know results of labs and further recommendations.   History of  colon polyps - due for surveillance colonoscopy in August, he was agreeable to proceed.  Trenton Cellar, MD [facility]

## 2019-06-04 LAB — AFP TUMOR MARKER: AFP-Tumor Marker: 8.9 ng/mL — ABNORMAL HIGH (ref <6.1)

## 2019-06-24 ENCOUNTER — Telehealth: Payer: Self-pay

## 2019-06-24 NOTE — Telephone Encounter (Signed)
-----   Message from Roetta Sessions, [place] sent at 06/03/2019  5:29 PM EDT ----- Regarding: remind pt of 2nd twinrix Pt due for 2nd twinrix - scheduled for July 24.  Call and remind pt of importance of compliance. Historically non-compliant

## 2019-06-24 NOTE — Telephone Encounter (Signed)
Called and spoke to pt and reminded him of appt next Friday 7-24 at 10:30am with nurse.  He asked me to talk to his sister.  Spoke to her and she said she will bring him to the appt.

## 2019-06-30 ENCOUNTER — Other Ambulatory Visit: Payer: Self-pay | Admitting: Gastroenterology

## 2019-07-04 ENCOUNTER — Ambulatory Visit (INDEPENDENT_AMBULATORY_CARE_PROVIDER_SITE_OTHER): Payer: Medicaid Other | Admitting: Gastroenterology

## 2019-07-04 DIAGNOSIS — Z23 Encounter for immunization: Secondary | ICD-10-CM

## 2019-07-07 ENCOUNTER — Encounter: Payer: Self-pay | Admitting: Gastroenterology

## 2019-07-14 ENCOUNTER — Encounter: Payer: Self-pay | Admitting: Gastroenterology

## 2019-07-29 ENCOUNTER — Ambulatory Visit (AMBULATORY_SURGERY_CENTER): Payer: Self-pay | Admitting: *Deleted

## 2019-07-29 ENCOUNTER — Other Ambulatory Visit: Payer: Self-pay

## 2019-07-29 VITALS — Ht 68.0 in | Wt 150.0 lb

## 2019-07-29 DIAGNOSIS — Z86010 Personal history of colonic polyps: Secondary | ICD-10-CM

## 2019-07-29 MED ORDER — NA SULFATE-K SULFATE-MG SULF 17.5-3.13-1.6 GM/177ML PO SOLN: 1.0000 | Freq: Once | ORAL | 0 refills | Status: AC

## 2019-07-29 NOTE — Progress Notes (Signed)
No egg or soy allergy known to patient  No issues with past sedation with any surgeries  or procedures, no intubation problems  No diet pills per patient No home 02 use per patient  No blood thinners per patient  Pt denies issues with constipation  No A fib or A flutter   

## 2019-08-06 ENCOUNTER — Encounter: Payer: Self-pay | Admitting: Gastroenterology

## 2019-08-11 ENCOUNTER — Telehealth: Payer: Self-pay

## 2019-08-11 NOTE — Telephone Encounter (Signed)
Covid-19 screening questions   Do you now or have you had a fever in the last 14 days? NO   Do you have any respiratory symptoms of shortness of breath or cough now or in the last 14 days? NO  Do you have any family members or close contacts with diagnosed or suspected Covid-19 in the past 14 days? NO  Have you been tested for Covid-19 and found to be positive? NO        

## 2019-08-12 ENCOUNTER — Ambulatory Visit (AMBULATORY_SURGERY_CENTER): Payer: Medicaid Other | Admitting: Gastroenterology

## 2019-08-12 ENCOUNTER — Encounter: Payer: Self-pay | Admitting: Gastroenterology

## 2019-08-12 ENCOUNTER — Other Ambulatory Visit: Payer: Self-pay

## 2019-08-12 VITALS — BP 139/84 | HR 79 | Temp 98.4°F | Resp 17 | Ht 68.0 in | Wt 156.0 lb

## 2019-08-12 DIAGNOSIS — Z1211 Encounter for screening for malignant neoplasm of colon: Secondary | ICD-10-CM | POA: Diagnosis not present

## 2019-08-12 DIAGNOSIS — Z86010 Personal history of colonic polyps: Secondary | ICD-10-CM | POA: Diagnosis not present

## 2019-08-12 DIAGNOSIS — D123 Benign neoplasm of transverse colon: Secondary | ICD-10-CM

## 2019-08-12 MED ORDER — SODIUM CHLORIDE 0.9 % IV SOLN: 500.0000 mL | Freq: Once | INTRAVENOUS | Status: DC

## 2019-08-12 MED ORDER — ONDANSETRON HCL 4 MG PO TABS: 4.0000 mg | ORAL_TABLET | Freq: Three times a day (TID) | ORAL | 1 refills | Status: DC | PRN

## 2019-08-12 NOTE — Progress Notes (Signed)
Pt's states no medical or surgical changes since previsit or office visit.  Vs in adm by CW  covid screen & temp by Hiram Comber

## 2019-08-12 NOTE — Op Note (Signed)
[facility] Patient Name: Stephen Carroll Procedure Date: 08/12/2019 9:12 AM MRN: [id number] Endoscopist: Remo Lipps P. Havery Moros , MD Age: 53 Referring MD:  Date of Birth: Jun 28, 1966 Gender: Male Account #: [id number] Procedure:                Colonoscopy Indications:              Surveillance: Personal history of adenomatous                            polyps on last colonoscopy 3 years ago Medicines:                Monitored Anesthesia Care Procedure:                Pre-Anesthesia Assessment:                           - Prior to the procedure, a History and Physical                            was performed, and patient medications and                            allergies were reviewed. The patient's tolerance of                            previous anesthesia was also reviewed. The risks                            and benefits of the procedure and the sedation                            options and risks were discussed with the patient.                            All questions were answered, and informed consent                            was obtained. Prior Anticoagulants: The patient has                            taken no previous anticoagulant or antiplatelet                            agents. ASA Grade Assessment: III - A patient with                            severe systemic disease. After reviewing the risks                            and benefits, the patient was deemed in                            satisfactory condition to undergo the procedure.  After obtaining informed consent, the colonoscope                            was passed under direct vision. Throughout the                            procedure, the patient's blood pressure, pulse, and                            oxygen saturations were monitored continuously. The                            Colonoscope was introduced through the anus and                            advanced to the the  cecum, identified by                            appendiceal orifice and ileocecal valve. The                            colonoscopy was performed without difficulty. The                            patient tolerated the procedure well. The quality                            of the bowel preparation was good. The ileocecal                            valve, appendiceal orifice, and rectum were                            photographed. Scope In: 9:16:29 AM Scope Out: 9:33:44 AM Scope Withdrawal Time: 0 hours 13 minutes 25 seconds  Total Procedure Duration: 0 hours 17 minutes 15 seconds  Findings:                 The perianal and digital rectal examinations were                            normal.                           A 3 mm polyp was found in the hepatic flexure. The                            polyp was sessile. The polyp was removed with a                            cold snare. Resection and retrieval were complete.                           Internal hemorrhoids were found during retroflexion.  The exam was otherwise without abnormality. Complications:            No immediate complications. Estimated blood loss:                            Minimal. Estimated Blood Loss:     Estimated blood loss: none. Estimated blood loss                            was minimal. Impression:               - One 3 mm polyp at the hepatic flexure, removed                            with a cold snare. Resected and retrieved.                           - Internal hemorrhoids.                           - The examination was otherwise normal. Recommendation:           - Patient has a contact number available for                            emergencies. The signs and symptoms of potential                            delayed complications were discussed with the                            patient. Return to normal activities tomorrow.                            Written discharge instructions  were provided to the                            patient.                           - Resume previous diet.                           - Continue present medications.                           - Await pathology results. Remo Lipps P. Yamila Cragin, MD 08/12/2019 9:38:26 AM This report has been signed electronically.

## 2019-08-12 NOTE — Progress Notes (Signed)
Called to room to assist during endoscopic procedure.  Patient ID and intended procedure confirmed with present staff. Received instructions for my participation in the procedure from the performing physician.  

## 2019-08-12 NOTE — Patient Instructions (Signed)
YOU HAD AN ENDOSCOPIC PROCEDURE TODAY AT [facility]:   Refer to the procedure report that was given to you for any specific questions about what was found during the examination.  If the procedure report does not answer your questions, please call your gastroenterologist to clarify.  If you requested that your care partner not be given the details of your procedure findings, then the procedure report has been included in a sealed envelope for you to review at your convenience later.  YOU SHOULD EXPECT: Some feelings of bloating in the abdomen. Passage of more gas than usual.  Walking can help get rid of the air that was put into your GI tract during the procedure and reduce the bloating. If you had a lower endoscopy (such as a colonoscopy or flexible sigmoidoscopy) you may notice spotting of blood in your stool or on the toilet paper. If you underwent a bowel prep for your procedure, you may not have a normal bowel movement for a few days.  Please Note:  You might notice some irritation and congestion in your nose or some drainage.  This is from the oxygen used during your procedure.  There is no need for concern and it should clear up in a day or so.  SYMPTOMS TO REPORT IMMEDIATELY:   Following lower endoscopy (colonoscopy or flexible sigmoidoscopy):  Excessive amounts of blood in the stool  Significant tenderness or worsening of abdominal pains  Swelling of the abdomen that is new, acute  Fever of 100F or higher  For urgent or emergent issues, a gastroenterologist can be reached at any hour by calling [phone].   DIET:  We do recommend a small meal at first, but then you may proceed to your regular diet.  Drink plenty of fluids but you should avoid alcoholic beverages for 24 hours.  ACTIVITY:  You should plan to take it easy for the rest of today and you should NOT DRIVE or use heavy machinery until tomorrow (because of the sedation medicines used during the test).     FOLLOW UP: Our staff will call the number listed on your records 48-72 hours following your procedure to check on you and address any questions or concerns that you may have regarding the information given to you following your procedure. If we do not reach you, we will leave a message.  We will attempt to reach you two times.  During this call, we will ask if you have developed any symptoms of COVID 19. If you develop any symptoms (ie: fever, flu-like symptoms, shortness of breath, cough etc.) before then, please call [phone].  If you test positive for Covid 19 in the 2 weeks post procedure, please call and report this information to [place].    If any biopsies were taken you will be contacted by phone or by letter within the next 1-3 weeks.  Please call us at [phone] if you have not heard about the biopsies in 3 weeks.    SIGNATURES/CONFIDENTIALITY: You and/or your care partner have signed paperwork which will be entered into your electronic medical record.  These signatures attest to the fact that that the information above on your After Visit Summary has been reviewed and is understood.  Full responsibility of the confidentiality of this discharge information lies with you and/or your care-partner.  Await pathology  Please read over handouts about polyps and hemorrhoids  Continue your normal medications  Refill of Zofran was sent to your pharmacy- you may  take 1 tablet every 8 hours as needed for nausea

## 2019-08-12 NOTE — Progress Notes (Signed)
Per Dr. Havery Moros- ok to give refills of Zofran 1 tablet every 8 hours as needed, #60, 1 refill.

## 2019-08-12 NOTE — Progress Notes (Signed)
PT taken to PACU. Monitors in place. VSS. Report given to RN. 

## 2019-08-14 ENCOUNTER — Telehealth: Payer: Self-pay | Admitting: *Deleted

## 2019-08-14 NOTE — Telephone Encounter (Signed)
First attempt, no answer and no VM setup.  

## 2019-08-14 NOTE — Telephone Encounter (Signed)
Returning call. Is doing very good.

## 2019-08-14 NOTE — Telephone Encounter (Signed)
Pt returned call stating that he is doing well.  °

## 2019-08-14 NOTE — Telephone Encounter (Signed)
  Follow up Call-  Call back number 08/12/2019  Post procedure Call Back phone  # [phone]- sister's phone -OK to ask her about patient  Permission to leave phone message Yes  Some recent data might be hidden     Patient questions:  Patient's VM has not been set up. Second call.

## 2019-08-21 ENCOUNTER — Encounter: Payer: Self-pay | Admitting: Gastroenterology

## 2019-09-04 ENCOUNTER — Telehealth: Payer: Self-pay

## 2019-09-04 ENCOUNTER — Other Ambulatory Visit: Payer: Self-pay

## 2019-09-04 DIAGNOSIS — Z86010 Personal history of colonic polyps: Secondary | ICD-10-CM

## 2019-09-04 NOTE — Telephone Encounter (Signed)
-----   Message from Hughie Closs, RN sent at 06/04/2019  3:50 PM EDT ----- Put order in Epic and call patient for 3 month F/U of CBC & AFP =09/04/19

## 2019-09-04 NOTE — Telephone Encounter (Signed)
Called patient to come in for his 3 months labs, CBC & AFP. Order in [place] and he will come in next week

## 2019-09-11 ENCOUNTER — Other Ambulatory Visit (INDEPENDENT_AMBULATORY_CARE_PROVIDER_SITE_OTHER): Payer: Medicaid Other

## 2019-09-11 DIAGNOSIS — Z86010 Personal history of colonic polyps: Secondary | ICD-10-CM | POA: Diagnosis not present

## 2019-09-11 LAB — CBC WITH DIFFERENTIAL/PLATELET
Basophils Absolute: 0 10*3/uL (ref 0.0–0.1)
Basophils Relative: 1.3 % (ref 0.0–3.0)
Eosinophils Absolute: 0.1 10*3/uL (ref 0.0–0.7)
Eosinophils Relative: 1.9 % (ref 0.0–5.0)
HCT: 33.3 % — ABNORMAL LOW (ref 39.0–52.0)
Hemoglobin: 11.1 g/dL — ABNORMAL LOW (ref 13.0–17.0)
Lymphocytes Relative: 40.3 % (ref 12.0–46.0)
Lymphs Abs: 1.5 10*3/uL (ref 0.7–4.0)
MCHC: 33.4 g/dL (ref 30.0–36.0)
MCV: 88.1 fl (ref 78.0–100.0)
Monocytes Absolute: 0.5 10*3/uL (ref 0.1–1.0)
Monocytes Relative: 13.6 % — ABNORMAL HIGH (ref 3.0–12.0)
Neutro Abs: 1.6 10*3/uL (ref 1.4–7.7)
Neutrophils Relative %: 42.9 % — ABNORMAL LOW (ref 43.0–77.0)
Platelets: 130 10*3/uL — ABNORMAL LOW (ref 150.0–400.0)
RBC: 3.78 Mil/uL — ABNORMAL LOW (ref 4.22–5.81)
RDW: 14.2 % (ref 11.5–15.5)
WBC: 3.7 10*3/uL — ABNORMAL LOW (ref 4.0–10.5)

## 2019-09-12 LAB — AFP TUMOR MARKER: AFP-Tumor Marker: 6.8 ng/mL — ABNORMAL HIGH (ref <6.1)

## 2019-09-17 NOTE — Progress Notes (Signed)
    Subjective:  Stephen Carroll is a 53 y.o. male who presents to the [facility] today with a chief complaint of follow up.   HPI:   Stephen Carroll with hx of EtOH abuse w/ cirrhosis, colon polyps with elevated AFP here for anemia evaluatuion.   Anemia Seen by GI and wanted patient to follow up with his PCP regarding his ongoing anemia. Denies recent fever, nausea, vomiting, hematuria, hematochezia, melena, abdominal pain, chest pain and palpitations. Endorses chronic back pain that is not changed.  Has no concerns at this time.  Continues to drink alcohol daily and smokes a cigar on occasion.  Alcohol Abuse States that he drinks when someone makes him upset or if he gets to tremors.  Reports drinking one 24 ounce can of beer daily.   [place], medications and smoking status reviewed.   ROS: See HPI     Objective:  Physical Exam: BP 130/80   Pulse 98   Wt 151 lb (68.5 kg)   SpO2 98%   BMI 22.96 kg/m    GEN: Well-nourished, well-developed male in no acute distress CV: regular rate and rhythm, no murmurs appreciated, distal pulses equal in intact RESP: no increased work of breathing, clear to ascultation bilaterally  ABD: Bowel sounds present. Soft, Nontender, Nondistended. SKIN: warm, dry NEURO: grossly normal, moves all extremities appropriately PSYCH: Normal affect and thought content   No results found for this or any previous visit (from the past 72 hour(s)).   Assessment/Plan:  Pancytopenia (HCC) CBC on 09/11/19 Hgb 11.1, MCV 88.1, RBC 3.78, Plt 130. Reviewing previous labs pancytopenia is not new for patient. Patient with ongoing pancytopenia likely secondarily to alcohol abuse. Patient with history of alcoholic cirrhosis with a possible autoimmune component as she had a + SMA, IgG and ANA.  Seen by GI for colon polyps and elevated AFP.  Would like to repeat CMP, Ferritin, TIBC today however patient declined. During shared decision making discussion patient elected not to  see a hematologist.    Alcohol abuse Patient continues to drink alcohol daily. Declined lab work to assess liver function.  Patient lacks insight regarding his alcoholism.  Continue to counsel patient on the need to stop drinking alcohol.      No orders of the defined types were placed in this encounter.      No orders of the defined types were placed in this encounter.   Health Maintenance reviewed - patient refused influenza vaccination.  Lyndee Hensen, DO PGY-1, [facility] 09/18/2019 9:06 AM

## 2019-09-18 ENCOUNTER — Other Ambulatory Visit: Payer: Self-pay

## 2019-09-18 ENCOUNTER — Encounter: Payer: Self-pay | Admitting: Family Medicine

## 2019-09-18 ENCOUNTER — Ambulatory Visit (INDEPENDENT_AMBULATORY_CARE_PROVIDER_SITE_OTHER): Payer: Medicaid Other | Admitting: Family Medicine

## 2019-09-18 VITALS — BP 130/80 | HR 98 | Wt 151.0 lb

## 2019-09-18 DIAGNOSIS — D61818 Other pancytopenia: Secondary | ICD-10-CM | POA: Diagnosis not present

## 2019-09-18 DIAGNOSIS — F1010 Alcohol abuse, uncomplicated: Secondary | ICD-10-CM

## 2019-09-18 NOTE — Assessment & Plan Note (Addendum)
CBC on 09/11/19 Hgb 11.1, MCV 88.1, RBC 3.78, Plt 130. Reviewing previous labs pancytopenia is not new for patient. Patient with ongoing pancytopenia likely secondarily to alcohol abuse. Patient with history of alcoholic cirrhosis with a possible autoimmune component as she had a + SMA, IgG and ANA.  Seen by GI for colon polyps and elevated AFP.  Would like to repeat CMP, Ferritin, TIBC today however patient declined. During shared decision making discussion patient elected not to see a hematologist.

## 2019-09-18 NOTE — Assessment & Plan Note (Addendum)
Patient continues to drink alcohol daily. Declined lab work to assess liver function.  Patient lacks insight regarding his alcoholism.  Continue to counsel patient on the need to stop drinking alcohol.

## 2019-09-18 NOTE — Patient Instructions (Signed)
It was great seeing you today!   I'd like to see you back for your regular check up but if you need to be seen earlier than that for any new issues we're happy to fit you in, just give [place] a call!   If you have questions or concerns please do not hesitate to call at [phone].  Dr. Rushie [facility]

## 2019-10-22 ENCOUNTER — Other Ambulatory Visit: Payer: Self-pay | Admitting: Gastroenterology

## 2019-10-22 DIAGNOSIS — K7030 Alcoholic cirrhosis of liver without ascites: Secondary | ICD-10-CM

## 2019-10-22 NOTE — Telephone Encounter (Signed)
OK to refill lasix 40mg  once daily? Thanks

## 2019-10-23 NOTE — Telephone Encounter (Signed)
Yes okay to refill but he should have a CMET done to recheck liver and renal function, can you ask him to go to the lab to get that done. Thanks

## 2019-10-23 NOTE — Telephone Encounter (Signed)
I have attempted to reach patient but he does not answer and voicemail is not set up. Will attempt to call back at a later time. Orders have been placed in Epic for CMP.

## 2019-10-24 ENCOUNTER — Telehealth: Payer: Self-pay | Admitting: Gastroenterology

## 2019-10-24 NOTE — Telephone Encounter (Signed)
Spoke with patient's sister/caregiver Seth Bake to advise patient needs labwork so we may continue giving furosemide and can make sure patient's liver and kidneys are functioning as we expect them to. She verbalizes understanding.

## 2019-10-24 NOTE — Telephone Encounter (Signed)
Called pt but unable to leave a message.  Mailed letter to patient requesting that he go to the lab for CMET.  Script sent

## 2019-10-24 NOTE — Telephone Encounter (Signed)
Pt's sister and caregiver Seth Bake called stating that she was returning somebody's call. She said that somebody from the office called yesterday and this morning. That person did not leave a message.

## 2019-10-30 ENCOUNTER — Other Ambulatory Visit (INDEPENDENT_AMBULATORY_CARE_PROVIDER_SITE_OTHER): Payer: Medicaid Other

## 2019-10-30 DIAGNOSIS — K7030 Alcoholic cirrhosis of liver without ascites: Secondary | ICD-10-CM | POA: Diagnosis not present

## 2019-10-30 LAB — COMPREHENSIVE METABOLIC PANEL
ALT: 22 U/L (ref 0–53)
AST: 58 U/L — ABNORMAL HIGH (ref 0–37)
Albumin: 3.7 g/dL (ref 3.5–5.2)
Alkaline Phosphatase: 66 U/L (ref 39–117)
BUN: 5 mg/dL — ABNORMAL LOW (ref 6–23)
CO2: 28 mEq/L (ref 19–32)
Calcium: 9.1 mg/dL (ref 8.4–10.5)
Chloride: 94 mEq/L — ABNORMAL LOW (ref 96–112)
Creatinine, Ser: 0.7 mg/dL (ref 0.40–1.50)
GFR: 142.95 mL/min (ref >60.00)
Glucose, Bld: 102 mg/dL — ABNORMAL HIGH (ref 70–99)
Potassium: 3.8 mEq/L (ref 3.5–5.1)
Sodium: 132 mEq/L — ABNORMAL LOW (ref 135–145)
Total Bilirubin: 1 mg/dL (ref 0.2–1.2)
Total Protein: 9.2 g/dL — ABNORMAL HIGH (ref 6.0–8.3)

## 2019-11-28 ENCOUNTER — Other Ambulatory Visit: Payer: Self-pay | Admitting: Gastroenterology

## 2019-12-24 ENCOUNTER — Ambulatory Visit: Payer: Medicaid Other | Admitting: Gastroenterology

## 2020-01-05 ENCOUNTER — Emergency Department (HOSPITAL_COMMUNITY)
Admission: EM | Admit: 2020-01-05 | Discharge: 2020-01-05 | Disposition: A | Discharge status: Home / Self Care | Payer: Medicaid Other | Attending: Emergency Medicine | Admitting: Emergency Medicine

## 2020-01-05 ENCOUNTER — Emergency Department (HOSPITAL_COMMUNITY): Payer: Medicaid Other

## 2020-01-05 ENCOUNTER — Other Ambulatory Visit: Payer: Self-pay

## 2020-01-05 ENCOUNTER — Encounter (HOSPITAL_COMMUNITY): Payer: Self-pay

## 2020-01-05 DIAGNOSIS — F10230 Alcohol dependence with withdrawal, uncomplicated: Secondary | ICD-10-CM | POA: Diagnosis not present

## 2020-01-05 DIAGNOSIS — F10930 Alcohol use, unspecified with withdrawal, uncomplicated: Secondary | ICD-10-CM

## 2020-01-05 DIAGNOSIS — R066 Hiccough: Secondary | ICD-10-CM | POA: Insufficient documentation

## 2020-01-05 DIAGNOSIS — Z79899 Other long term (current) drug therapy: Secondary | ICD-10-CM | POA: Diagnosis not present

## 2020-01-05 DIAGNOSIS — I10 Essential (primary) hypertension: Secondary | ICD-10-CM | POA: Diagnosis not present

## 2020-01-05 DIAGNOSIS — R002 Palpitations: Secondary | ICD-10-CM | POA: Diagnosis not present

## 2020-01-05 DIAGNOSIS — Z87891 Personal history of nicotine dependence: Secondary | ICD-10-CM | POA: Insufficient documentation

## 2020-01-05 DIAGNOSIS — R000 Tachycardia, unspecified: Secondary | ICD-10-CM | POA: Diagnosis not present

## 2020-01-05 LAB — BASIC METABOLIC PANEL
Anion gap: 14 (ref 5–15)
BUN: <5 mg/dL — ABNORMAL LOW (ref 6–20)
CO2: 25 mmol/L (ref 22–32)
Calcium: 8.6 mg/dL — ABNORMAL LOW (ref 8.9–10.3)
Chloride: 87 mmol/L — ABNORMAL LOW (ref 98–111)
Creatinine, Ser: 0.73 mg/dL (ref 0.61–1.24)
GFR calc Af Amer: >60 mL/min (ref >60)
GFR calc non Af Amer: >60 mL/min (ref >60)
Glucose, Bld: 129 mg/dL — ABNORMAL HIGH (ref 70–99)
Potassium: 4.8 mmol/L (ref 3.5–5.1)
Sodium: 126 mmol/L — ABNORMAL LOW (ref 135–145)

## 2020-01-05 LAB — HEPATIC FUNCTION PANEL
ALT: 42 U/L (ref 0–44)
AST: 124 U/L — ABNORMAL HIGH (ref 15–41)
Albumin: 3.1 g/dL — ABNORMAL LOW (ref 3.5–5.0)
Alkaline Phosphatase: 71 U/L (ref 38–126)
Bilirubin, Direct: 0.8 mg/dL — ABNORMAL HIGH (ref 0.0–0.2)
Indirect Bilirubin: 1.9 mg/dL — ABNORMAL HIGH (ref 0.3–0.9)
Total Bilirubin: 2.7 mg/dL — ABNORMAL HIGH (ref 0.3–1.2)
Total Protein: 9.1 g/dL — ABNORMAL HIGH (ref 6.5–8.1)

## 2020-01-05 LAB — CBC
HCT: 31.1 % — ABNORMAL LOW (ref 39.0–52.0)
Hemoglobin: 11.3 g/dL — ABNORMAL LOW (ref 13.0–17.0)
MCH: 29.3 pg (ref 26.0–34.0)
MCHC: 36.3 g/dL — ABNORMAL HIGH (ref 30.0–36.0)
MCV: 80.6 fL (ref 80.0–100.0)
Platelets: 94 10*3/uL — ABNORMAL LOW (ref 150–400)
RBC: 3.86 MIL/uL — ABNORMAL LOW (ref 4.22–5.81)
RDW: 14.9 % (ref 11.5–15.5)
WBC: 10 10*3/uL (ref 4.0–10.5)
nRBC: 0 % (ref 0.0–0.2)

## 2020-01-05 LAB — TROPONIN I (HIGH SENSITIVITY): Troponin I (High Sensitivity): 5 ng/L (ref <18)

## 2020-01-05 LAB — MAGNESIUM: Magnesium: 2.1 mg/dL (ref 1.7–2.4)

## 2020-01-05 MED ORDER — LORAZEPAM 1 MG PO TABS
1.0000 mg | ORAL_TABLET | Freq: Once | ORAL | Status: AC
  Administered 2020-01-05: 20:00:00 1 mg via ORAL
  Filled 2020-01-05: qty 1

## 2020-01-05 MED ORDER — PROCHLORPERAZINE EDISYLATE 10 MG/2ML IJ SOLN
5.0000 mg | Freq: Once | INTRAMUSCULAR | Status: AC
  Administered 2020-01-05: 18:00:00 5 mg via INTRAVENOUS
  Filled 2020-01-05: qty 2

## 2020-01-05 MED ORDER — LACTATED RINGERS IV BOLUS
500.0000 mL | Freq: Once | INTRAVENOUS | Status: AC
  Administered 2020-01-05: 500 mL via INTRAVENOUS

## 2020-01-05 MED ORDER — LACTATED RINGERS IV BOLUS
500.0000 mL | Freq: Once | INTRAVENOUS | Status: AC
  Administered 2020-01-05: 18:00:00 500 mL via INTRAVENOUS

## 2020-01-05 NOTE — ED Notes (Signed)
Gave pt a sprite to drink

## 2020-01-05 NOTE — ED Notes (Addendum)
Pt able to tolerate PO fluids.  
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
## 2020-01-05 NOTE — ED Triage Notes (Signed)
Pt arrived via [facility] from home for uncontrollable hiccups that started at 12:00 today. When EMS put pt on heart monitor pt heart rate was 170. Pt denies chest pain and SOB. Pt is A&Ox4. Pt is sinus tach on monitor. Breathing normal

## 2020-01-05 NOTE — ED Notes (Signed)
Discharge instructions discussed with pt. Pt verbalized understanding with no questions at this time. Pt to go home with sister 

## 2020-01-05 NOTE — ED Notes (Signed)
Pt refuses to do a PO challenge. He stated he just wants to sleep.

## 2020-01-14 ENCOUNTER — Ambulatory Visit: Payer: Medicaid Other | Admitting: Podiatry

## 2020-01-14 ENCOUNTER — Other Ambulatory Visit: Payer: Self-pay

## 2020-01-14 ENCOUNTER — Encounter: Payer: Self-pay | Admitting: Podiatry

## 2020-01-14 DIAGNOSIS — M2042 Other hammer toe(s) (acquired), left foot: Secondary | ICD-10-CM | POA: Diagnosis not present

## 2020-01-14 DIAGNOSIS — M2041 Other hammer toe(s) (acquired), right foot: Secondary | ICD-10-CM | POA: Insufficient documentation

## 2020-01-14 DIAGNOSIS — M79675 Pain in left toe(s): Secondary | ICD-10-CM | POA: Diagnosis not present

## 2020-01-14 DIAGNOSIS — M2010 Hallux valgus (acquired), unspecified foot: Secondary | ICD-10-CM | POA: Diagnosis not present

## 2020-01-14 DIAGNOSIS — L84 Corns and callosities: Secondary | ICD-10-CM | POA: Insufficient documentation

## 2020-01-14 DIAGNOSIS — M79674 Pain in right toe(s): Secondary | ICD-10-CM

## 2020-01-14 DIAGNOSIS — B351 Tinea unguium: Secondary | ICD-10-CM | POA: Diagnosis not present
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
## 2020-01-29 ENCOUNTER — Ambulatory Visit: Payer: Medicaid Other | Admitting: Gastroenterology

## 2020-02-06 ENCOUNTER — Other Ambulatory Visit: Payer: Self-pay

## 2020-02-06 ENCOUNTER — Ambulatory Visit (INDEPENDENT_AMBULATORY_CARE_PROVIDER_SITE_OTHER): Payer: Medicaid Other | Admitting: Podiatry

## 2020-02-06 ENCOUNTER — Encounter: Payer: Self-pay | Admitting: Podiatry

## 2020-02-06 DIAGNOSIS — M2041 Other hammer toe(s) (acquired), right foot: Secondary | ICD-10-CM

## 2020-02-06 DIAGNOSIS — M2042 Other hammer toe(s) (acquired), left foot: Secondary | ICD-10-CM | POA: Diagnosis not present

## 2020-02-06 DIAGNOSIS — M2010 Hallux valgus (acquired), unspecified foot: Secondary | ICD-10-CM

## 2020-02-06 DIAGNOSIS — L84 Corns and callosities: Secondary | ICD-10-CM
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
## 2020-02-08 ENCOUNTER — Other Ambulatory Visit: Payer: Self-pay | Admitting: Gastroenterology

## 2020-03-01 ENCOUNTER — Ambulatory Visit: Payer: Medicaid Other | Admitting: Gastroenterology

## 2020-03-03 ENCOUNTER — Ambulatory Visit (INDEPENDENT_AMBULATORY_CARE_PROVIDER_SITE_OTHER): Payer: Medicaid Other | Admitting: Gastroenterology

## 2020-03-03 ENCOUNTER — Encounter: Payer: Self-pay | Admitting: Gastroenterology

## 2020-03-03 ENCOUNTER — Other Ambulatory Visit (INDEPENDENT_AMBULATORY_CARE_PROVIDER_SITE_OTHER): Payer: Medicaid Other

## 2020-03-03 VITALS — BP 122/62 | HR 72 | Temp 98.0°F | Ht 68.0 in | Wt 156.0 lb

## 2020-03-03 DIAGNOSIS — I8500 Esophageal varices without bleeding: Secondary | ICD-10-CM

## 2020-03-03 DIAGNOSIS — Z86010 Personal history of colonic polyps: Secondary | ICD-10-CM | POA: Diagnosis not present

## 2020-03-03 DIAGNOSIS — K7030 Alcoholic cirrhosis of liver without ascites: Secondary | ICD-10-CM | POA: Diagnosis not present

## 2020-03-03 LAB — COMPREHENSIVE METABOLIC PANEL
ALT: 17 U/L (ref 0–53)
AST: 37 U/L (ref 0–37)
Albumin: 3.8 g/dL (ref 3.5–5.2)
Alkaline Phosphatase: 67 U/L (ref 39–117)
BUN: 5 mg/dL — ABNORMAL LOW (ref 6–23)
CO2: 25 mEq/L (ref 19–32)
Calcium: 9.3 mg/dL (ref 8.4–10.5)
Chloride: 98 mEq/L (ref 96–112)
Creatinine, Ser: 0.71 mg/dL (ref 0.40–1.50)
GFR: 140.45 mL/min (ref >60.00)
Glucose, Bld: 95 mg/dL (ref 70–99)
Potassium: 4.1 mEq/L (ref 3.5–5.1)
Sodium: 130 mEq/L — ABNORMAL LOW (ref 135–145)
Total Bilirubin: 1 mg/dL (ref 0.2–1.2)
Total Protein: 9.1 g/dL — ABNORMAL HIGH (ref 6.0–8.3)

## 2020-03-03 LAB — CBC WITH DIFFERENTIAL/PLATELET
Basophils Absolute: 0.1 10*3/uL (ref 0.0–0.1)
Basophils Relative: 1.2 % (ref 0.0–3.0)
Eosinophils Absolute: 0.1 10*3/uL (ref 0.0–0.7)
Eosinophils Relative: 1.6 % (ref 0.0–5.0)
HCT: 35.7 % — ABNORMAL LOW (ref 39.0–52.0)
Hemoglobin: 11.7 g/dL — ABNORMAL LOW (ref 13.0–17.0)
Lymphocytes Relative: 30.1 % (ref 12.0–46.0)
Lymphs Abs: 1.3 10*3/uL (ref 0.7–4.0)
MCHC: 32.8 g/dL (ref 30.0–36.0)
MCV: 88 fl (ref 78.0–100.0)
Monocytes Absolute: 0.6 10*3/uL (ref 0.1–1.0)
Monocytes Relative: 14.2 % — ABNORMAL HIGH (ref 3.0–12.0)
Neutro Abs: 2.3 10*3/uL (ref 1.4–7.7)
Neutrophils Relative %: 52.9 % (ref 43.0–77.0)
Platelets: 174 10*3/uL (ref 150.0–400.0)
RBC: 4.06 Mil/uL — ABNORMAL LOW (ref 4.22–5.81)
RDW: 14.2 % (ref 11.5–15.5)
WBC: 4.4 10*3/uL (ref 4.0–10.5)

## 2020-03-03 LAB — PROTIME-INR
INR: 1.3 ratio — ABNORMAL HIGH (ref 0.8–1.0)
Prothrombin Time: 14.1 s — ABNORMAL HIGH (ref 9.6–13.1)

## 2020-03-03 MED ORDER — ONDANSETRON HCL 4 MG PO TABS: 4.0000 mg | ORAL_TABLET | Freq: Three times a day (TID) | ORAL | 1 refills | Status: DC | PRN

## 2020-03-03 NOTE — Patient Instructions (Addendum)
If you are age 53 or older, your body mass index should be between 23-30. Your Body mass index is 23.72 kg/m. If this is out of the aforementioned range listed, please consider follow up with your Primary Care Provider.  If you are age 6 or younger, your body mass index should be between 19-25. Your Body mass index is 23.72 kg/m. If this is out of the aformentioned range listed, please consider follow up with your Primary Care Provider.     You have been scheduled for an abdominal ultrasound at [facility] (1st floor of hospital) on Friday, April 30th at 8:30am. Please arrive 15 minutes prior to your appointment for registration. Make certain not to have anything to eat or drink 6 hours prior to your appointment. Should you need to reschedule your appointment, please contact radiology at [phone]. This test typically takes about 30 minutes to perform.   Please go to the lab in the basement of our building to have lab work done as you leave today. Hit "B" for basement when you get on the elevator.  When the doors open the lab is on your left.  We will call you with the results. Thank you.  Due to recent changes in healthcare laws, you may see the results of your imaging and laboratory studies on MyChart before your provider has had a chance to review them.  We understand that in some cases there may be results that are confusing or concerning to you. Not all laboratory results come back in the same time frame and the provider may be waiting for multiple results in order to interpret others.  Please give [place] 48 hours in order for your provider to thoroughly review all the results before contacting the office for clarification of your results.    You are scheduled for your COVID VACCINE this Thursday, 03-04-20 at 2:15pm.  We have sent the following medications to your pharmacy for you to pick up at your convenience: Zofran   Thank you for entrusting me with your care and for choosing  [facility], Dr. Morton Cellar
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
## 2020-03-04 ENCOUNTER — Ambulatory Visit: Payer: Medicaid Other | Attending: Internal Medicine

## 2020-03-04 DIAGNOSIS — Z23 Encounter for immunization: Secondary | ICD-10-CM

## 2020-03-04 LAB — AFP TUMOR MARKER: AFP-Tumor Marker: 9.6 ng/mL — ABNORMAL HIGH (ref <6.1)

## 2020-03-04 NOTE — Progress Notes (Signed)
   Covid-19 Vaccination Clinic  Name:  Stephen Carroll    MRN: [id number] DOB: 05/26/1966  03/04/2020  Mr. Cockcroft was observed post Covid-19 immunization for 15 minutes without incident. He was provided with Vaccine Information Sheet and instruction to access the V-Safe system.   Mr. Margeson was instructed to call 911 with any severe reactions post vaccine: Marland Kitchen Difficulty breathing  . Swelling of face and throat  . A fast heartbeat  . A bad rash all over body  . Dizziness and weakness   Immunizations Administered    Name Date Dose VIS Date Route   Pfizer COVID-19 Vaccine 03/04/2020  2:00 PM 0.3 mL 11/21/2019 Intramuscular   Manufacturer: [place]   Lot: [id number]   [place]: [id number]

## 2020-03-11 ENCOUNTER — Ambulatory Visit (HOSPITAL_COMMUNITY): Payer: Medicaid Other

## 2020-03-17 ENCOUNTER — Telehealth: Payer: Self-pay | Admitting: Gastroenterology

## 2020-03-17 NOTE — Telephone Encounter (Signed)
Letter printed and left at the front desk for sister, Nyzir Oki to pick up.  Pt's sister is aware.

## 2020-03-17 NOTE — Progress Notes (Signed)
Letter printed for pt

## 2020-03-17 NOTE — Telephone Encounter (Signed)
Thanks Jan, if you can give him a letter for this that would be appreciated.

## 2020-03-17 NOTE — Telephone Encounter (Signed)
LM for pt to call back to discuss request.

## 2020-03-29 ENCOUNTER — Ambulatory Visit: Payer: Medicaid Other

## 2020-03-30 ENCOUNTER — Ambulatory Visit: Payer: Medicaid Other | Attending: Internal Medicine

## 2020-03-30 DIAGNOSIS — Z23 Encounter for immunization: Secondary | ICD-10-CM

## 2020-03-30 NOTE — Progress Notes (Signed)
   Covid-19 Vaccination Clinic  Name:  NAETHAN VIVO    MRN: [id number] DOB: June 24, 1966  03/30/2020  Mr. Maves was observed post Covid-19 immunization for 15 minutes without incident. He was provided with Vaccine Information Sheet and instruction to access the V-Safe system.   Mr. Center was instructed to call 911 with any severe reactions post vaccine: Marland Kitchen Difficulty breathing  . Swelling of face and throat  . A fast heartbeat  . A bad rash all over body  . Dizziness and weakness   Immunizations Administered    Name Date Dose VIS Date Route   Pfizer COVID-19 Vaccine 03/30/2020  8:06 AM 0.3 mL 02/04/2019 Intramuscular   Manufacturer: [place]   Lot: [id number]   [place]: [id number]

## 2020-04-09 ENCOUNTER — Ambulatory Visit (HOSPITAL_COMMUNITY): Admission: RE | Admit: 2020-04-09 | Payer: Medicaid Other | Source: Ambulatory Visit

## 2020-05-14 ENCOUNTER — Other Ambulatory Visit: Payer: Self-pay

## 2020-05-14 ENCOUNTER — Encounter: Payer: Self-pay | Admitting: Podiatry

## 2020-05-14 ENCOUNTER — Ambulatory Visit: Payer: Medicaid Other | Admitting: Podiatry

## 2020-05-14 DIAGNOSIS — M2041 Other hammer toe(s) (acquired), right foot: Secondary | ICD-10-CM | POA: Diagnosis not present

## 2020-05-14 DIAGNOSIS — M2042 Other hammer toe(s) (acquired), left foot: Secondary | ICD-10-CM

## 2020-05-14 DIAGNOSIS — M2010 Hallux valgus (acquired), unspecified foot: Secondary | ICD-10-CM

## 2020-05-14 DIAGNOSIS — L84 Corns and callosities: Secondary | ICD-10-CM

## 2020-05-14 DIAGNOSIS — M79674 Pain in right toe(s): Secondary | ICD-10-CM | POA: Diagnosis not present

## 2020-05-14 DIAGNOSIS — B351 Tinea unguium: Secondary | ICD-10-CM

## 2020-05-14 DIAGNOSIS — M79675 Pain in left toe(s): Secondary | ICD-10-CM

## 2020-05-14 NOTE — Progress Notes (Signed)
Complaint:  Visit Type: Patient returns to my office for continued preventative foot care services. Complaint: Patient states" my nails have grown long and thick and become painful to walk and wear shoes" Patient has severe corn pain second toes  Both feet due to bunions.. The patient presents for preventative foot care services.  Podiatric Exam: Vascular: dorsalis pedis and posterior tibial pulses are palpable bilateral. Capillary return is immediate. Temperature gradient is WNL. Skin turgor WNL  Sensorium: Normal Semmes Weinstein monofilament test. Normal tactile sensation bilaterally. Nail Exam: Pt has thick disfigured discolored nails with subungual debris noted bilateral entire nail hallux through fifth toenails Ulcer Exam: There is no evidence of ulcer or pre-ulcerative changes or infection. Orthopedic Exam: Muscle tone and strength are WNL. No limitations in general ROM. No crepitus or effusions noted. Foot type and digits show no abnormalities. HAV 1st MPJ  B/L with overlapping second digits  B/L Skin: No Porokeratosis. No infection or ulcers.  Skin pain 4 th interdigital space due to ADV fifth toe  B/L.  Diagnosis:  Onychomycosis, , Pain in right toe, pain in left toes  Corn second toe  B/L.  Treatment & Plan Procedures and Treatment: Consent by patient was obtained for treatment procedures.   Debridement of mycotic and hypertrophic toenails, 1 through 5 bilateral and clearing of subungual debris using a nail nipper and dremel tool.  Debride corns with # 15 blade.  Patient to be seen by one of the surgeons for surgical consultation. Return Visit-Office Procedure: Patient instructed to return to the office for a follow up visit 3 months for continued evaluation and treatment.    Gardiner Barefoot DPM

## 2020-05-16 ENCOUNTER — Other Ambulatory Visit: Payer: Self-pay | Admitting: Gastroenterology

## 2020-06-01 ENCOUNTER — Other Ambulatory Visit: Payer: Self-pay

## 2020-06-01 ENCOUNTER — Encounter: Payer: Self-pay | Admitting: Podiatry

## 2020-06-01 ENCOUNTER — Ambulatory Visit: Payer: Medicaid Other | Admitting: Podiatry

## 2020-06-01 DIAGNOSIS — M2042 Other hammer toe(s) (acquired), left foot: Secondary | ICD-10-CM | POA: Diagnosis not present

## 2020-06-01 DIAGNOSIS — D492 Neoplasm of unspecified behavior of bone, soft tissue, and skin: Secondary | ICD-10-CM

## 2020-06-01 DIAGNOSIS — M2041 Other hammer toe(s) (acquired), right foot: Secondary | ICD-10-CM | POA: Diagnosis not present

## 2020-06-01 DIAGNOSIS — M2011 Hallux valgus (acquired), right foot: Secondary | ICD-10-CM | POA: Diagnosis not present

## 2020-06-01 DIAGNOSIS — M2010 Hallux valgus (acquired), unspecified foot: Secondary | ICD-10-CM

## 2020-06-01 NOTE — Progress Notes (Signed)
Subjective: 54 year old male presents the office today for surgical consultation given bunion, hammertoe deformity resulting in painful hyperkeratotic lesions to the medial aspect of bilateral second toes.  However he states he is not 1 have surgery he just wants the calluses trimmed.  He is very active and wear shoes which causes pressure.  Denies any open sores. Denies any systemic complaints such as fevers, chills, nausea, vomiting. No acute changes since last appointment, and no other complaints at this time.   Objective: AAO x3, NAD DP/PT pulses palpable bilaterally, CRT less than 3 seconds Bunion, hammertoe deformities are evident.  Resulting hyperkeratotic lesion on the medial aspect of the PIPJ's bilaterally as well as the distal aspect fifth toe.  There is no ongoing ulceration drainage or signs of infection. No pain with calf compression, swelling, warmth, erythema  Assessment: Preulcerative calluses bilaterally, digital deformity  Plan: -All treatment options discussed with the patient including all alternatives, risks, complications.  -Discussed with surgical intervention but he does not want to proceed with this.  I debrided the calluses with any complications or bleeding.  Dispensed offloading pads.  Discussed shoe modifications.  Follow-up with Dr. Prudence Davidson. -Patient encouraged to call the office with any questions, concerns, change in symptoms.   Trula Slade DPM

## 2020-06-07 ENCOUNTER — Telehealth: Payer: Self-pay | Admitting: Gastroenterology

## 2020-06-07 NOTE — Telephone Encounter (Signed)
Patient's sister states that she was calling for him to see if he was due for a follow up. Advised that based on his last office note that he needs a 6 month follow up which will be due in September, advised that we do not have that scheduled available at this time. Pt's sister states that she will call back around August to schedule appt for patient.

## 2020-07-01 ENCOUNTER — Other Ambulatory Visit: Payer: Self-pay | Admitting: Gastroenterology

## 2020-07-01 NOTE — Telephone Encounter (Signed)
[facility] MD: Dr. Havery Moros  After hours call re: medication refill  Patient's sister called requesting a refill on aldactone 100 mg daily. Her pharmacist told her that office has not responded to requests for refills. He has enough doses to get through tomorrow.   Script sent to [facility] on [address].  Jan, please confirm with the patient's sister that she was able to get the prescription filled.  Thank you.

## 2020-07-05 NOTE — Telephone Encounter (Signed)
Thanks for your help with this Stephen Carroll

## 2020-07-19 ENCOUNTER — Other Ambulatory Visit: Payer: Self-pay | Admitting: Gastroenterology

## 2020-08-15 ENCOUNTER — Other Ambulatory Visit: Payer: Self-pay | Admitting: Gastroenterology

## 2020-08-17 ENCOUNTER — Other Ambulatory Visit: Payer: Self-pay | Admitting: Gastroenterology

## 2020-08-17 ENCOUNTER — Telehealth: Payer: Self-pay | Admitting: Gastroenterology

## 2020-08-17 ENCOUNTER — Telehealth: Payer: Self-pay

## 2020-08-17 MED ORDER — SPIRONOLACTONE 100 MG PO TABS: 100.0000 mg | ORAL_TABLET | Freq: Every day | ORAL | 0 refills | Status: DC

## 2020-08-17 NOTE — Telephone Encounter (Signed)
Refills of lasix and spironolactone sent to pharm per SA with instructions to call and schedule an OV. Pt scheduled for U/S and labs on 08-24-20.

## 2020-08-17 NOTE — Telephone Encounter (Signed)
Thanks Jan. Baldomero Lamy if he has been taking them we can refill and yes if he can go to the lab he is due for those as well. Can you give him a 3 month supply. He is due for a routine office visit if you can help coordinate

## 2020-08-17 NOTE — Telephone Encounter (Signed)
Pt was due for RUQ Ultrasound in March.  He no-showed his April U/S appt.  I Rescheduled pt for 9-14, Tuesday at [facility] at 8:30am to arr at 8:15, NPO after midnight.  Called and spoke to his sister, Seth Bake.  She thought he had gone for an ultrasound several months ago.  She indicates she will make sure he gets to this ultrasound appt and go for labs afterwards. Letter mailed to pt at home address.  Address confirmed by sister.

## 2020-08-17 NOTE — Telephone Encounter (Signed)
Refills of lasix and spironolactone sent to pharm per SA with instructions to call and schedule an OV. Pt scheduled for U/S and labs on 08-24-20

## 2020-08-17 NOTE — Telephone Encounter (Signed)
Pt needs a refill on lasix 40mg  once daily.  I see he is due for labs later this month.  OK to refill? Thanks

## 2020-08-17 NOTE — Telephone Encounter (Signed)
Called and spoke to pt's sister.  She indicated patient is about out of spironolactone. She has requested that the pharmacy send a refill request but they indicate that we indicated that pt should contact the provider first. Will  Request refills from Dr. Havery Moros.  Pt has been scheduled for a RUQ U/S on 9-14 (he no showed an appt in April) and will be due for labs. Sister has been notified and confirmed she will get the pt to the appt.  Ok to refill spironolactone and lasix? If so, please provide # and refills to be sent. Labs to be done: CMET, CBC, PT/INR and AFP ? Please confirm.  Thank you

## 2020-08-18 ENCOUNTER — Other Ambulatory Visit: Payer: Self-pay

## 2020-08-18 ENCOUNTER — Encounter: Payer: Self-pay | Admitting: Podiatry

## 2020-08-18 ENCOUNTER — Ambulatory Visit: Payer: Medicaid Other | Admitting: Podiatry

## 2020-08-18 DIAGNOSIS — L84 Corns and callosities: Secondary | ICD-10-CM

## 2020-08-18 DIAGNOSIS — M2010 Hallux valgus (acquired), unspecified foot: Secondary | ICD-10-CM

## 2020-08-18 DIAGNOSIS — M79674 Pain in right toe(s): Secondary | ICD-10-CM

## 2020-08-18 DIAGNOSIS — B351 Tinea unguium: Secondary | ICD-10-CM

## 2020-08-18 DIAGNOSIS — M2041 Other hammer toe(s) (acquired), right foot: Secondary | ICD-10-CM

## 2020-08-18 DIAGNOSIS — M2042 Other hammer toe(s) (acquired), left foot: Secondary | ICD-10-CM

## 2020-08-18 DIAGNOSIS — M79675 Pain in left toe(s): Secondary | ICD-10-CM

## 2020-08-18 NOTE — Progress Notes (Signed)
Complaint:  Visit Type: Patient returns to my office for continued preventative foot care services. Complaint: Patient states" my nails have grown long and thick and become painful to walk and wear shoes" Patient has severe corn pain second toes  Both feet due to bunions.. The patient presents for preventative foot care services.  Podiatric Exam: Vascular: dorsalis pedis and posterior tibial pulses are palpable bilateral. Capillary return is immediate. Temperature gradient is WNL. Skin turgor WNL  Sensorium: Normal Semmes Weinstein monofilament test. Normal tactile sensation bilaterally. Nail Exam: Pt has thick disfigured discolored nails with subungual debris noted bilateral entire nail hallux through fifth toenails Ulcer Exam: There is no evidence of ulcer or pre-ulcerative changes or infection. Orthopedic Exam: Muscle tone and strength are WNL. No limitations in general ROM. No crepitus or effusions noted. Foot type and digits show no abnormalities. HAV 1st MPJ  B/L with overlapping second digits  B/L Skin: No Porokeratosis. No infection or ulcers.  Listers corn 5th digit right foot asymptomatic  Diagnosis:  Onychomycosis, , Pain in right toe, pain in left toes  Corn second toe  left  Treatment & Plan Procedures and Treatment: Consent by patient was obtained for treatment procedures.   Debridement of mycotic and hypertrophic toenails, 1 through 5 bilateral and clearing of subungual debris using a nail nipper and dremel tool.  Debride corns with # 15 blade.   Return Visit-Office Procedure: Patient instructed to return to the office for a follow up visit 3 months for continued evaluation and treatment.    Gardiner Barefoot DPM

## 2020-08-24 ENCOUNTER — Ambulatory Visit (HOSPITAL_COMMUNITY)
Admission: RE | Admit: 2020-08-24 | Discharge: 2020-08-24 | Disposition: A | Discharge status: Home / Self Care | Payer: Medicaid Other | Source: Ambulatory Visit | Attending: Gastroenterology | Admitting: Gastroenterology

## 2020-08-24 ENCOUNTER — Other Ambulatory Visit (INDEPENDENT_AMBULATORY_CARE_PROVIDER_SITE_OTHER): Payer: Medicaid Other

## 2020-08-24 ENCOUNTER — Other Ambulatory Visit: Payer: Self-pay

## 2020-08-24 DIAGNOSIS — K760 Fatty (change of) liver, not elsewhere classified: Secondary | ICD-10-CM | POA: Diagnosis not present

## 2020-08-24 DIAGNOSIS — D649 Anemia, unspecified: Secondary | ICD-10-CM | POA: Diagnosis not present

## 2020-08-24 DIAGNOSIS — I8500 Esophageal varices without bleeding: Secondary | ICD-10-CM | POA: Diagnosis not present

## 2020-08-24 DIAGNOSIS — K7030 Alcoholic cirrhosis of liver without ascites: Secondary | ICD-10-CM | POA: Diagnosis not present

## 2020-08-24 LAB — CBC WITH DIFFERENTIAL/PLATELET
Basophils Absolute: 0 10*3/uL (ref 0.0–0.1)
Basophils Relative: 1.2 % (ref 0.0–3.0)
Eosinophils Absolute: 0.1 10*3/uL (ref 0.0–0.7)
Eosinophils Relative: 1.4 % (ref 0.0–5.0)
HCT: 37.2 % — ABNORMAL LOW (ref 39.0–52.0)
Hemoglobin: 12.2 g/dL — ABNORMAL LOW (ref 13.0–17.0)
Lymphocytes Relative: 27 % (ref 12.0–46.0)
Lymphs Abs: 1.1 10*3/uL (ref 0.7–4.0)
MCHC: 32.7 g/dL (ref 30.0–36.0)
MCV: 84.9 fl (ref 78.0–100.0)
Monocytes Absolute: 0.6 10*3/uL (ref 0.1–1.0)
Monocytes Relative: 15.2 % — ABNORMAL HIGH (ref 3.0–12.0)
Neutro Abs: 2.2 10*3/uL (ref 1.4–7.7)
Neutrophils Relative %: 55.2 % (ref 43.0–77.0)
Platelets: 158 10*3/uL (ref 150.0–400.0)
RBC: 4.38 Mil/uL (ref 4.22–5.81)
RDW: 14.5 % (ref 11.5–15.5)
WBC: 3.9 10*3/uL — ABNORMAL LOW (ref 4.0–10.5)

## 2020-08-24 LAB — COMPREHENSIVE METABOLIC PANEL
ALT: 22 U/L (ref 0–53)
AST: 35 U/L (ref 0–37)
Albumin: 4.3 g/dL (ref 3.5–5.2)
Alkaline Phosphatase: 64 U/L (ref 39–117)
BUN: 7 mg/dL (ref 6–23)
CO2: 26 mEq/L (ref 19–32)
Calcium: 9.9 mg/dL (ref 8.4–10.5)
Chloride: 96 mEq/L (ref 96–112)
Creatinine, Ser: 0.77 mg/dL (ref 0.40–1.50)
GFR: 127.66 mL/min (ref >60.00)
Glucose, Bld: 109 mg/dL — ABNORMAL HIGH (ref 70–99)
Potassium: 4.6 mEq/L (ref 3.5–5.1)
Sodium: 132 mEq/L — ABNORMAL LOW (ref 135–145)
Total Bilirubin: 1.1 mg/dL (ref 0.2–1.2)
Total Protein: 9.7 g/dL — ABNORMAL HIGH (ref 6.0–8.3)

## 2020-08-24 LAB — PROTIME-INR
INR: 1.2 ratio — ABNORMAL HIGH (ref 0.8–1.0)
Prothrombin Time: 13.7 s — ABNORMAL HIGH (ref 9.6–13.1)

## 2020-08-24 NOTE — Progress Notes (Signed)
Pt due for labs for cirrhosis

## 2020-08-25 LAB — AFP TUMOR MARKER: AFP-Tumor Marker: 11.4 ng/mL — ABNORMAL HIGH (ref <6.1)

## 2020-08-26 ENCOUNTER — Other Ambulatory Visit: Payer: Self-pay

## 2020-08-26 DIAGNOSIS — K7030 Alcoholic cirrhosis of liver without ascites: Secondary | ICD-10-CM

## 2020-10-12 ENCOUNTER — Other Ambulatory Visit: Payer: Self-pay

## 2020-10-12 ENCOUNTER — Ambulatory Visit (HOSPITAL_COMMUNITY)
Admission: RE | Admit: 2020-10-12 | Discharge: 2020-10-12 | Disposition: A | Discharge status: Home / Self Care | Payer: Medicaid Other | Source: Ambulatory Visit | Attending: Gastroenterology | Admitting: Gastroenterology

## 2020-10-12 DIAGNOSIS — K760 Fatty (change of) liver, not elsewhere classified: Secondary | ICD-10-CM | POA: Diagnosis not present

## 2020-10-12 DIAGNOSIS — K7030 Alcoholic cirrhosis of liver without ascites: Secondary | ICD-10-CM

## 2020-10-12 DIAGNOSIS — K7460 Unspecified cirrhosis of liver: Secondary | ICD-10-CM | POA: Diagnosis not present

## 2020-10-12 MED ORDER — GADOBUTROL 1 MMOL/ML IV SOLN
10.0000 mL | Freq: Once | INTRAVENOUS | Status: AC | PRN
  Administered 2020-10-12: 7 mL via INTRAVENOUS

## 2020-10-26 ENCOUNTER — Ambulatory Visit: Payer: Medicaid Other | Admitting: Gastroenterology

## 2020-11-08 ENCOUNTER — Other Ambulatory Visit: Payer: Self-pay | Admitting: Gastroenterology

## 2020-11-08 NOTE — Telephone Encounter (Signed)
Ok to refill? He has an appt 12-17-20.  Had CMET 08-24-20

## 2020-11-08 NOTE — Telephone Encounter (Signed)
Yes okay to refill. He did not show for his last appointment, due for a routine follow up with me as well. Thanks

## 2020-11-17 ENCOUNTER — Ambulatory Visit (INDEPENDENT_AMBULATORY_CARE_PROVIDER_SITE_OTHER): Payer: Medicaid Other | Admitting: Podiatry

## 2020-11-17 ENCOUNTER — Encounter: Payer: Self-pay | Admitting: Podiatry

## 2020-11-17 ENCOUNTER — Other Ambulatory Visit: Payer: Self-pay

## 2020-11-17 DIAGNOSIS — M2010 Hallux valgus (acquired), unspecified foot: Secondary | ICD-10-CM

## 2020-11-17 DIAGNOSIS — M79675 Pain in left toe(s): Secondary | ICD-10-CM | POA: Diagnosis not present

## 2020-11-17 DIAGNOSIS — B351 Tinea unguium: Secondary | ICD-10-CM

## 2020-11-17 DIAGNOSIS — M2042 Other hammer toe(s) (acquired), left foot: Secondary | ICD-10-CM

## 2020-11-17 DIAGNOSIS — M79674 Pain in right toe(s): Secondary | ICD-10-CM | POA: Diagnosis not present

## 2020-11-17 DIAGNOSIS — M2041 Other hammer toe(s) (acquired), right foot: Secondary | ICD-10-CM

## 2020-11-17 NOTE — Progress Notes (Signed)
Complaint:  Visit Type: Patient returns to my office for continued preventative foot care services. Complaint: Patient states" my nails have grown long and thick and become painful to walk and wear shoes" Patient has severe corn pain second toes  Both feet due to bunions.. The patient presents for preventative foot care services.  Podiatric Exam: Vascular: dorsalis pedis and posterior tibial pulses are palpable bilateral. Capillary return is immediate. Temperature gradient is WNL. Skin turgor WNL  Sensorium: Normal Semmes Weinstein monofilament test. Normal tactile sensation bilaterally. Nail Exam: Pt has thick disfigured discolored nails with subungual debris noted bilateral entire nail hallux through fifth toenails Ulcer Exam: There is no evidence of ulcer or pre-ulcerative changes or infection. Orthopedic Exam: Muscle tone and strength are WNL. No limitations in general ROM. No crepitus or effusions noted. Foot type and digits show no abnormalities. HAV 1st MPJ  B/L with overlapping second digits  B/L Skin: No Porokeratosis. No infection or ulcers. Corn second toe left foot.  Diagnosis:  Onychomycosis, , Pain in right toe, pain in left toes  Corn second toe  left  Treatment & Plan Procedures and Treatment: Consent by patient was obtained for treatment procedures.   Debridement of mycotic and hypertrophic toenails, 1 through 5 bilateral and clearing of subungual debris using a nail nipper and dremel tool.  Debride corns with # 15 blade.   Return Visit-Office Procedure: Patient instructed to return to the office for a follow up visit 3 months for continued evaluation and treatment.    Gardiner Barefoot DPM

## 2020-12-01 ENCOUNTER — Telehealth: Payer: Self-pay | Admitting: Gastroenterology

## 2020-12-01 DIAGNOSIS — Z86010 Personal history of colonic polyps: Secondary | ICD-10-CM

## 2020-12-01 NOTE — Telephone Encounter (Signed)
Inbound call from patient's sister stating patient is out of his Zofran medication and is requesting a refill be sent to pharmacy in chart please.  Patient has an appointment scheduled for 12/17/2020.

## 2020-12-02 ENCOUNTER — Other Ambulatory Visit: Payer: Self-pay

## 2020-12-02 ENCOUNTER — Encounter (HOSPITAL_COMMUNITY): Payer: Self-pay | Admitting: *Deleted

## 2020-12-02 ENCOUNTER — Emergency Department (HOSPITAL_COMMUNITY)
Admission: EM | Admit: 2020-12-02 | Discharge: 2020-12-02 | Disposition: A | Discharge status: Home / Self Care | Payer: Medicaid Other | Attending: Emergency Medicine | Admitting: Emergency Medicine

## 2020-12-02 DIAGNOSIS — R112 Nausea with vomiting, unspecified: Secondary | ICD-10-CM | POA: Diagnosis not present

## 2020-12-02 DIAGNOSIS — Z87891 Personal history of nicotine dependence: Secondary | ICD-10-CM | POA: Diagnosis not present

## 2020-12-02 LAB — LIPASE, BLOOD: Lipase: 27 U/L (ref 11–51)

## 2020-12-02 LAB — URINALYSIS, ROUTINE W REFLEX MICROSCOPIC
Bilirubin Urine: NEGATIVE
Glucose, UA: NEGATIVE mg/dL
Ketones, ur: NEGATIVE mg/dL
Nitrite: NEGATIVE
Protein, ur: 30 mg/dL — AB
Specific Gravity, Urine: 1.013 (ref 1.005–1.030)
pH: 6 (ref 5.0–8.0)

## 2020-12-02 LAB — COMPREHENSIVE METABOLIC PANEL
ALT: 35 U/L (ref 0–44)
AST: 65 U/L — ABNORMAL HIGH (ref 15–41)
Albumin: 3.4 g/dL — ABNORMAL LOW (ref 3.5–5.0)
Alkaline Phosphatase: 60 U/L (ref 38–126)
Anion gap: 12 (ref 5–15)
BUN: <5 mg/dL — ABNORMAL LOW (ref 6–20)
CO2: 23 mmol/L (ref 22–32)
Calcium: 9 mg/dL (ref 8.9–10.3)
Chloride: 92 mmol/L — ABNORMAL LOW (ref 98–111)
Creatinine, Ser: 0.8 mg/dL (ref 0.61–1.24)
GFR, Estimated: >60 mL/min (ref >60)
Glucose, Bld: 129 mg/dL — ABNORMAL HIGH (ref 70–99)
Potassium: 3.6 mmol/L (ref 3.5–5.1)
Sodium: 127 mmol/L — ABNORMAL LOW (ref 135–145)
Total Bilirubin: 1.1 mg/dL (ref 0.3–1.2)
Total Protein: 8.8 g/dL — ABNORMAL HIGH (ref 6.5–8.1)

## 2020-12-02 LAB — CBC
HCT: 35.9 % — ABNORMAL LOW (ref 39.0–52.0)
Hemoglobin: 12.7 g/dL — ABNORMAL LOW (ref 13.0–17.0)
MCH: 28.7 pg (ref 26.0–34.0)
MCHC: 35.4 g/dL (ref 30.0–36.0)
MCV: 81.2 fL (ref 80.0–100.0)
Platelets: 157 10*3/uL (ref 150–400)
RBC: 4.42 MIL/uL (ref 4.22–5.81)
RDW: 14.7 % (ref 11.5–15.5)
WBC: 10.3 10*3/uL (ref 4.0–10.5)
nRBC: 0 % (ref 0.0–0.2)

## 2020-12-02 MED ORDER — ONDANSETRON 4 MG PO TBDP
4.0000 mg | ORAL_TABLET | Freq: Once | ORAL | Status: AC | PRN
  Administered 2020-12-02: 4 mg via ORAL
  Filled 2020-12-02: qty 1

## 2020-12-02 MED ORDER — METOCLOPRAMIDE HCL 5 MG/ML IJ SOLN
10.0000 mg | Freq: Once | INTRAMUSCULAR | Status: AC
  Administered 2020-12-02: 10 mg via INTRAVENOUS
  Filled 2020-12-02: qty 2

## 2020-12-02 MED ORDER — ONDANSETRON HCL 4 MG PO TABS: 4.0000 mg | ORAL_TABLET | Freq: Three times a day (TID) | ORAL | 0 refills | Status: DC | PRN

## 2020-12-02 MED ORDER — SODIUM CHLORIDE 0.9 % IV BOLUS
500.0000 mL | Freq: Once | INTRAVENOUS | Status: AC
  Administered 2020-12-02: 500 mL via INTRAVENOUS

## 2020-12-02 NOTE — Telephone Encounter (Signed)
Prescription for Zofran sent to patient's pharmacy until upcoming appt.
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
## 2020-12-02 NOTE — ED Notes (Signed)
Patient verbalizes understanding of discharge instructions. Opportunity for questioning and answers were provided. Armband removed by staff, pt discharged from ED and ambulated to lobby to return home with family.  

## 2020-12-02 NOTE — ED Triage Notes (Signed)
C/o nausea with vomiting x 3 days c/o cough

## 2020-12-02 NOTE — Discharge Instructions (Signed)
Take the nausea medication as needed. Make sure you are drinking plenty of fluids and slowly advancing your diet as tolerated. Follow-up with your primary care provider or the 1 listed below. Return to the ER if you start to experience worsening vomiting, chest pain, abdominal pain or bloody stools.

## 2020-12-03 ENCOUNTER — Other Ambulatory Visit: Payer: Self-pay | Admitting: Gastroenterology

## 2020-12-06 ENCOUNTER — Telehealth: Payer: Self-pay

## 2020-12-06 NOTE — Telephone Encounter (Signed)
Transition Care Management Follow-up Telephone Call  Date of discharge and from where: 12/02/2020 from [facility]  How have you been since you were released from the hospital? Patient states that he is feeling much better and is able to keep food and fluids down.   Any questions or concerns? No  Items Reviewed:  Did the pt receive and understand the discharge instructions provided? Yes   Medications obtained and verified? Yes   Other? No   Any new allergies since your discharge? No   Dietary orders reviewed? Yes  Do you have support at home? Yes   Functional Questionnaire: (I = Independent and D = Dependent) ADLs: I  Bathing/Dressing- I  Meal Prep- I  Eating- I  Maintaining continence- I  Transferring/Ambulation- I  Managing Meds- I  Follow up appointments reviewed:   Special[facility] Hospital f/u appt confirmed? Yes  Scheduled to see Armbruster MD on 12/17/2020 @ 08:30am.  Are transportation arrangements needed? No   If their condition worsens, is the pt aware to call PCP or go to the Emergency Dept.? Yes  Was the patient provided with contact information for the PCP's office or ED? Yes  Was to pt encouraged to call back with questions or concerns? Yes

## 2020-12-17 ENCOUNTER — Encounter: Payer: Self-pay | Admitting: Gastroenterology

## 2020-12-17 ENCOUNTER — Ambulatory Visit (INDEPENDENT_AMBULATORY_CARE_PROVIDER_SITE_OTHER): Payer: Medicaid Other | Admitting: Gastroenterology

## 2020-12-17 VITALS — BP 124/80 | HR 84 | Ht 67.0 in | Wt 148.0 lb

## 2020-12-17 DIAGNOSIS — I8500 Esophageal varices without bleeding: Secondary | ICD-10-CM

## 2020-12-17 DIAGNOSIS — R772 Abnormality of alphafetoprotein: Secondary | ICD-10-CM | POA: Diagnosis not present

## 2020-12-17 DIAGNOSIS — K7030 Alcoholic cirrhosis of liver without ascites: Secondary | ICD-10-CM

## 2020-12-17 NOTE — Patient Instructions (Addendum)
If you are age 54 or older, your body mass index should be between 23-30. Your Body mass index is 23.18 kg/m. If this is out of the aforementioned range listed, please consider follow up with your Primary Care Provider.  If you are age 27 or younger, your body mass index should be between 19-25. Your Body mass index is 23.18 kg/m. If this is out of the aformentioned range listed, please consider follow up with your Primary Care Provider.   You will be due for lab work in March. We will call you and remind you when it is time to go.    You will be due for a follow up appointment in June.   Continue lasix, aldactone, propranolol and Zofran.   Thank you for entrusting me with your care and for [facility], Dr. [place] Cellar
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
## 2021-01-14 ENCOUNTER — Other Ambulatory Visit: Payer: Self-pay

## 2021-01-14 MED ORDER — SPIRONOLACTONE 100 MG PO TABS
100.0000 mg | ORAL_TABLET | Freq: Every day | ORAL | 2 refills | Status: DC
Start: 2021-01-14 — End: 2021-03-28

## 2021-01-14 MED ORDER — FUROSEMIDE 40 MG PO TABS
40.0000 mg | ORAL_TABLET | Freq: Every day | ORAL | 2 refills | Status: DC
Start: 2021-01-14 — End: 2021-03-28

## 2021-02-04 ENCOUNTER — Ambulatory Visit: Payer: Medicaid Other | Admitting: Podiatry

## 2021-02-11 ENCOUNTER — Telehealth: Payer: Self-pay

## 2021-02-11 NOTE — Telephone Encounter (Signed)
-----   Message from Roetta Sessions, [place] sent at 12/17/2020  9:07 AM EST ----- Regarding: due for labs Due for CMET, AFP and PT/INR in March (Dx: Alcoholic cirrhosis, esoph varices, elevated AFP)

## 2021-02-11 NOTE — Telephone Encounter (Signed)
Called and spoke to patient's sister.  She will have him go to the lab one day this week or next.  Labs are in.

## 2021-02-15 ENCOUNTER — Other Ambulatory Visit (INDEPENDENT_AMBULATORY_CARE_PROVIDER_SITE_OTHER): Payer: Medicaid Other

## 2021-02-15 DIAGNOSIS — R772 Abnormality of alphafetoprotein: Secondary | ICD-10-CM

## 2021-02-15 DIAGNOSIS — I8500 Esophageal varices without bleeding: Secondary | ICD-10-CM | POA: Diagnosis not present

## 2021-02-15 DIAGNOSIS — K7030 Alcoholic cirrhosis of liver without ascites: Secondary | ICD-10-CM | POA: Diagnosis not present

## 2021-02-15 LAB — COMPREHENSIVE METABOLIC PANEL
ALT: 31 U/L (ref 0–53)
AST: 58 U/L — ABNORMAL HIGH (ref 0–37)
Albumin: 3.8 g/dL (ref 3.5–5.2)
Alkaline Phosphatase: 65 U/L (ref 39–117)
BUN: 5 mg/dL — ABNORMAL LOW (ref 6–23)
CO2: 28 mEq/L (ref 19–32)
Calcium: 9.1 mg/dL (ref 8.4–10.5)
Chloride: 93 mEq/L — ABNORMAL LOW (ref 96–112)
Creatinine, Ser: 0.7 mg/dL (ref 0.40–1.50)
GFR: 104.9 mL/min (ref >60.00)
Glucose, Bld: 89 mg/dL (ref 70–99)
Potassium: 3.8 mEq/L (ref 3.5–5.1)
Sodium: 131 mEq/L — ABNORMAL LOW (ref 135–145)
Total Bilirubin: 0.8 mg/dL (ref 0.2–1.2)
Total Protein: 8.7 g/dL — ABNORMAL HIGH (ref 6.0–8.3)

## 2021-02-15 LAB — PROTIME-INR
INR: 1.2 ratio — ABNORMAL HIGH (ref 0.8–1.0)
Prothrombin Time: 13.4 s — ABNORMAL HIGH (ref 9.6–13.1)

## 2021-02-16 ENCOUNTER — Telehealth: Payer: Self-pay

## 2021-02-16 LAB — AFP TUMOR MARKER: AFP-Tumor Marker: 10.2 ng/mL — ABNORMAL HIGH (ref <6.1)

## 2021-02-16 NOTE — Telephone Encounter (Signed)
Patient is due for labs this week. Sister is aware and indicated she will get him to the lab.

## 2021-02-16 NOTE — Telephone Encounter (Signed)
-----   Message from Roetta Sessions, [place] sent at 08/26/2020  9:20 AM EDT ----- Regarding: labs and U/S due Labs and RUQ ultrasound due 02-2021 [place] screening  Cbc, cmet, pt/inr, AFP

## 2021-02-18 ENCOUNTER — Other Ambulatory Visit: Payer: Self-pay

## 2021-02-18 ENCOUNTER — Ambulatory Visit: Payer: Medicaid Other | Admitting: Podiatry

## 2021-02-18 ENCOUNTER — Encounter: Payer: Self-pay | Admitting: Podiatry

## 2021-02-18 DIAGNOSIS — M2042 Other hammer toe(s) (acquired), left foot: Secondary | ICD-10-CM

## 2021-02-18 DIAGNOSIS — B351 Tinea unguium: Secondary | ICD-10-CM

## 2021-02-18 DIAGNOSIS — M79674 Pain in right toe(s): Secondary | ICD-10-CM | POA: Diagnosis not present

## 2021-02-18 DIAGNOSIS — M79675 Pain in left toe(s): Secondary | ICD-10-CM | POA: Diagnosis not present

## 2021-02-18 DIAGNOSIS — M2041 Other hammer toe(s) (acquired), right foot: Secondary | ICD-10-CM

## 2021-02-18 DIAGNOSIS — L84 Corns and callosities: Secondary | ICD-10-CM

## 2021-02-18 DIAGNOSIS — M2010 Hallux valgus (acquired), unspecified foot: Secondary | ICD-10-CM

## 2021-02-18 NOTE — Progress Notes (Signed)
Complaint:  Visit Type: Patient returns to my office for continued preventative foot care services. Complaint: Patient states" my nails have grown long and thick and become painful to walk and wear shoes" Patient has severe corn pain second toes  Both feet due to bunions.. The patient presents for preventative foot care services.  Podiatric Exam: Vascular: dorsalis pedis and posterior tibial pulses are palpable bilateral. Capillary return is immediate. Temperature gradient is WNL. Skin turgor WNL  Sensorium: Normal Semmes Weinstein monofilament test. Normal tactile sensation bilaterally. Nail Exam: Pt has thick disfigured discolored nails with subungual debris noted bilateral entire nail hallux through fifth toenails Ulcer Exam: There is no evidence of ulcer or pre-ulcerative changes or infection. Orthopedic Exam: Muscle tone and strength are WNL. No limitations in general ROM. No crepitus or effusions noted. Foot type and digits show no abnormalities. HAV 1st MPJ  B/L with overlapping second digits  B/L Skin: No Porokeratosis. No infection or ulcers. Corn second toe left foot. Heel callus right foot.  Listers corn fifth toe left foot.  Diagnosis:  Onychomycosis, , Pain in right toe, pain in left toes  Callus feet  B/L.  Treatment & Plan Procedures and Treatment: Consent by patient was obtained for treatment procedures.   Debridement of mycotic and hypertrophic toenails, 1 through 5 bilateral and clearing of subungual debris using a nail nipper and dremel tool.  Debride corns with # 15 blade.   Return Visit-Office Procedure: Patient instructed to return to the office for a follow up visit 3 months for continued evaluation and treatment.    Gardiner Barefoot DPM

## 2021-03-25 ENCOUNTER — Other Ambulatory Visit: Payer: Self-pay | Admitting: Gastroenterology

## 2021-03-25 DIAGNOSIS — Z86010 Personal history of colonic polyps: Secondary | ICD-10-CM

## 2021-04-09 ENCOUNTER — Other Ambulatory Visit: Payer: Self-pay | Admitting: Gastroenterology

## 2021-04-11 ENCOUNTER — Telehealth: Payer: Self-pay

## 2021-04-11 DIAGNOSIS — K7030 Alcoholic cirrhosis of liver without ascites: Secondary | ICD-10-CM

## 2021-04-11 DIAGNOSIS — R772 Abnormality of alphafetoprotein: Secondary | ICD-10-CM

## 2021-04-11 NOTE — Telephone Encounter (Signed)
Spoke with patient to remind him that he is due for a RUQ [place] at this time. Advised that a message has been sent to the radiology schedulers and they will contact him directly to be scheduled. I offered to give him the radiology scheduling number just in case he didn't hear from them in a timely manner and patient states that he didn't have anything to write with. Advised again that they will be calling him directly to schedule. Patient verbalized understanding and had no concerns at the end of the call.

## 2021-04-11 NOTE — Telephone Encounter (Addendum)
-----   Message from Stephen Edwards, RN sent at 10/12/2020  1:06 PM EDT ----- Regarding: Labs/Imaging Repeat RUQ [place] - cirrhosis - see 10/12/20 MRI result note

## 2021-04-28 ENCOUNTER — Other Ambulatory Visit: Payer: Self-pay

## 2021-04-28 ENCOUNTER — Ambulatory Visit (HOSPITAL_COMMUNITY)
Admission: RE | Admit: 2021-04-28 | Discharge: 2021-04-28 | Disposition: A | Discharge status: Home / Self Care | Payer: Medicaid Other | Source: Ambulatory Visit | Attending: Gastroenterology | Admitting: Gastroenterology

## 2021-04-28 DIAGNOSIS — K760 Fatty (change of) liver, not elsewhere classified: Secondary | ICD-10-CM | POA: Diagnosis not present

## 2021-04-28 DIAGNOSIS — R772 Abnormality of alphafetoprotein: Secondary | ICD-10-CM | POA: Diagnosis not present

## 2021-04-28 DIAGNOSIS — K7030 Alcoholic cirrhosis of liver without ascites: Secondary | ICD-10-CM | POA: Diagnosis not present

## 2021-05-03 ENCOUNTER — Other Ambulatory Visit: Payer: Self-pay | Admitting: Gastroenterology

## 2021-05-27 ENCOUNTER — Ambulatory Visit: Payer: Medicaid Other | Admitting: Podiatry

## 2021-06-03 ENCOUNTER — Encounter: Payer: Self-pay | Admitting: Podiatry

## 2021-06-03 ENCOUNTER — Other Ambulatory Visit: Payer: Self-pay

## 2021-06-03 ENCOUNTER — Ambulatory Visit: Payer: Medicaid Other | Admitting: Podiatry

## 2021-06-03 DIAGNOSIS — L84 Corns and callosities: Secondary | ICD-10-CM

## 2021-06-03 DIAGNOSIS — M2010 Hallux valgus (acquired), unspecified foot: Secondary | ICD-10-CM

## 2021-06-03 DIAGNOSIS — M2041 Other hammer toe(s) (acquired), right foot: Secondary | ICD-10-CM | POA: Diagnosis not present

## 2021-06-03 DIAGNOSIS — D492 Neoplasm of unspecified behavior of bone, soft tissue, and skin: Secondary | ICD-10-CM

## 2021-06-03 DIAGNOSIS — M79674 Pain in right toe(s): Secondary | ICD-10-CM

## 2021-06-03 DIAGNOSIS — M79675 Pain in left toe(s): Secondary | ICD-10-CM | POA: Diagnosis not present

## 2021-06-03 DIAGNOSIS — M2042 Other hammer toe(s) (acquired), left foot: Secondary | ICD-10-CM

## 2021-06-03 DIAGNOSIS — B351 Tinea unguium: Secondary | ICD-10-CM

## 2021-06-03 NOTE — Progress Notes (Signed)
Complaint:  Visit Type: Patient returns to my office for continued preventative foot care services. Complaint: Patient states" my nails have grown long and thick and become painful to walk and wear shoes" Patient has severe corn pain fifth toes both feet. Painful heel callus left foot. The patient presents for preventative foot care services.  Podiatric Exam: Vascular: dorsalis pedis and posterior tibial pulses are palpable bilateral. Capillary return is immediate. Temperature gradient is WNL. Skin turgor WNL  Sensorium: Normal Semmes Weinstein monofilament test. Normal tactile sensation bilaterally. Nail Exam: Pt has thick disfigured discolored nails with subungual debris noted bilateral entire nail hallux through fifth toenails Ulcer Exam: There is no evidence of ulcer or pre-ulcerative changes or infection. Orthopedic Exam: Muscle tone and strength are WNL. No limitations in general ROM. No crepitus or effusions noted. Foot type and digits show no abnormalities. HAV 1st MPJ .  Severe adducto varus fifth toes  B/L. Skin: No Porokeratosis. No infection or ulcers.  Heel callus left foot.  Listers corn fifth toe left foot.  Diagnosis:  Onychomycosis, , Pain in right toe, pain in left toes  Callus feet  B/L.  Treatment & Plan Procedures and Treatment: Consent by patient was obtained for treatment procedures.   Debridement of mycotic and hypertrophic toenails, 1 through 5 bilateral and clearing of subungual debris using a nail nipper and dremel tool.  Debride corns with # 15 blade.  Told patient to make an appointment for surgery for toe/foot surgery. Return Visit-Office Procedure: Patient instructed to return to the office for a follow up visit 3 months for continued evaluation and treatment.    Gardiner Barefoot DPM

## 2021-06-07 ENCOUNTER — Other Ambulatory Visit: Payer: Self-pay | Admitting: Gastroenterology

## 2021-06-13 ENCOUNTER — Other Ambulatory Visit: Payer: Self-pay | Admitting: Gastroenterology

## 2021-06-13 DIAGNOSIS — Z86010 Personal history of colonic polyps: Secondary | ICD-10-CM

## 2021-07-10 ENCOUNTER — Other Ambulatory Visit: Payer: Self-pay | Admitting: Gastroenterology

## 2021-08-02 ENCOUNTER — Ambulatory Visit: Payer: Medicaid Other | Admitting: Gastroenterology

## 2021-08-05 ENCOUNTER — Ambulatory Visit: Payer: Medicaid Other | Admitting: Podiatry

## 2021-08-07 ENCOUNTER — Other Ambulatory Visit: Payer: Self-pay | Admitting: Gastroenterology

## 2021-08-08 ENCOUNTER — Encounter: Payer: Self-pay | Admitting: Podiatrist

## 2021-08-08 ENCOUNTER — Other Ambulatory Visit: Payer: Self-pay

## 2021-08-08 ENCOUNTER — Ambulatory Visit (INDEPENDENT_AMBULATORY_CARE_PROVIDER_SITE_OTHER): Payer: Medicaid Other | Admitting: Podiatrist

## 2021-08-08 DIAGNOSIS — M79675 Pain in left toe(s): Secondary | ICD-10-CM

## 2021-08-08 DIAGNOSIS — M79674 Pain in right toe(s): Secondary | ICD-10-CM

## 2021-08-08 DIAGNOSIS — B351 Tinea unguium: Secondary | ICD-10-CM

## 2021-08-08 NOTE — Patient Instructions (Signed)

## 2021-08-08 NOTE — Progress Notes (Signed)
Chief Complaint  Patient presents with   debride    RFC -BL nails, corns and callus care at BL foot Tx: none     HPI: Patient is 55 y.o. male who presents today for preventive foot care services.  He relates his nails are painful and his corns and calluses are bothersome.  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    No Known Allergies  Review of systems is reviewed and negative.   Physical Exam  Patient is awake, alert, and oriented x 3.  In no acute distress.    Podiatric Exam: Vascular: dorsalis pedis and posterior tibial pulses are palpable bilateral. Capillary return is immediate. Temperature gradient is WNL. Skin turgor WNL  Sensorium: Normal Semmes Weinstein monofilament test. Normal tactile sensation bilaterally. Nail Exam: Pt has thick disfigured discolored nails with subungual debris noted bilateral entire nail hallux through fifth toenails Ulcer Exam: There is no evidence of ulcer or pre-ulcerative changes or infection. Orthopedic Exam: Muscle tone and strength are WNL. No limitations in general ROM. No crepitus or effusions noted. Foot type and digits show no abnormalities. HAV 1st MPJ .  Severe adducto varus fifth toes  B/L. Skin: No Porokeratosis. No infection or ulcers.  Heel callus left foot.  Listers corn fifth toe left foot.   Assessment: 1. Pain due to onychomycosis of toenails of both feet     Plan: Digital nails are thick, discolored, dystrophic, brittle with subungual debris present and clinically mycotic x 10.   Patient to be seen back for routine care in 3 months and will call if problems arise.

## 2021-08-24 ENCOUNTER — Encounter: Payer: Self-pay | Admitting: Gastroenterology

## 2021-08-24 ENCOUNTER — Other Ambulatory Visit (INDEPENDENT_AMBULATORY_CARE_PROVIDER_SITE_OTHER): Payer: Medicaid Other

## 2021-08-24 ENCOUNTER — Ambulatory Visit (INDEPENDENT_AMBULATORY_CARE_PROVIDER_SITE_OTHER): Payer: Medicaid Other | Admitting: Gastroenterology

## 2021-08-24 VITALS — BP 126/70 | HR 84 | Ht 67.0 in | Wt 145.1 lb

## 2021-08-24 DIAGNOSIS — Z789 Other specified health status: Secondary | ICD-10-CM

## 2021-08-24 DIAGNOSIS — K7030 Alcoholic cirrhosis of liver without ascites: Secondary | ICD-10-CM | POA: Diagnosis not present

## 2021-08-24 DIAGNOSIS — I8510 Secondary esophageal varices without bleeding: Secondary | ICD-10-CM

## 2021-08-24 DIAGNOSIS — Z7289 Other problems related to lifestyle: Secondary | ICD-10-CM | POA: Diagnosis not present

## 2021-08-24 DIAGNOSIS — F1090 Alcohol use, unspecified, uncomplicated: Secondary | ICD-10-CM

## 2021-08-24 LAB — CBC WITH DIFFERENTIAL/PLATELET
Basophils Absolute: 0 10*3/uL (ref 0.0–0.1)
Basophils Relative: 1.3 % (ref 0.0–3.0)
Eosinophils Absolute: 0 10*3/uL (ref 0.0–0.7)
Eosinophils Relative: 0.9 % (ref 0.0–5.0)
HCT: 35.8 % — ABNORMAL LOW (ref 39.0–52.0)
Hemoglobin: 11.9 g/dL — ABNORMAL LOW (ref 13.0–17.0)
Lymphocytes Relative: 40.8 % (ref 12.0–46.0)
Lymphs Abs: 1.1 10*3/uL (ref 0.7–4.0)
MCHC: 33.2 g/dL (ref 30.0–36.0)
MCV: 86.8 fl (ref 78.0–100.0)
Monocytes Absolute: 0.3 10*3/uL (ref 0.1–1.0)
Monocytes Relative: 10.3 % (ref 3.0–12.0)
Neutro Abs: 1.3 10*3/uL — ABNORMAL LOW (ref 1.4–7.7)
Neutrophils Relative %: 46.7 % (ref 43.0–77.0)
Platelets: 124 10*3/uL — ABNORMAL LOW (ref 150.0–400.0)
RBC: 4.13 Mil/uL — ABNORMAL LOW (ref 4.22–5.81)
RDW: 16.3 % — ABNORMAL HIGH (ref 11.5–15.5)
WBC: 2.8 10*3/uL — ABNORMAL LOW (ref 4.0–10.5)

## 2021-08-24 LAB — COMPREHENSIVE METABOLIC PANEL
ALT: 39 U/L (ref 0–53)
AST: 84 U/L — ABNORMAL HIGH (ref 0–37)
Albumin: 4 g/dL (ref 3.5–5.2)
Alkaline Phosphatase: 91 U/L (ref 39–117)
BUN: 3 mg/dL — ABNORMAL LOW (ref 6–23)
CO2: 31 mEq/L (ref 19–32)
Calcium: 9.2 mg/dL (ref 8.4–10.5)
Chloride: 94 mEq/L — ABNORMAL LOW (ref 96–112)
Creatinine, Ser: 0.64 mg/dL (ref 0.40–1.50)
GFR: 107.38 mL/min (ref >60.00)
Glucose, Bld: 94 mg/dL (ref 70–99)
Potassium: 3.8 mEq/L (ref 3.5–5.1)
Sodium: 132 mEq/L — ABNORMAL LOW (ref 135–145)
Total Bilirubin: 0.7 mg/dL (ref 0.2–1.2)
Total Protein: 9.6 g/dL — ABNORMAL HIGH (ref 6.0–8.3)

## 2021-08-24 LAB — PROTIME-INR
INR: 1.3 ratio — ABNORMAL HIGH (ref 0.8–1.0)
Prothrombin Time: 13.6 s — ABNORMAL HIGH (ref 9.6–13.1)
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
## 2021-08-25 ENCOUNTER — Other Ambulatory Visit: Payer: Self-pay

## 2021-08-25 DIAGNOSIS — K7030 Alcoholic cirrhosis of liver without ascites: Secondary | ICD-10-CM

## 2021-08-25 LAB — AFP TUMOR MARKER: AFP-Tumor Marker: 12.6 ng/mL — ABNORMAL HIGH (ref <6.1)

## 2021-09-01 ENCOUNTER — Other Ambulatory Visit: Payer: Self-pay | Admitting: Gastroenterology

## 2021-09-25 ENCOUNTER — Other Ambulatory Visit: Payer: Self-pay | Admitting: Gastroenterology

## 2021-09-29 ENCOUNTER — Telehealth: Payer: Self-pay

## 2021-09-29 DIAGNOSIS — K7030 Alcoholic cirrhosis of liver without ascites: Secondary | ICD-10-CM

## 2021-09-29 NOTE — Telephone Encounter (Signed)
Scheduled patient for [facility] screening on Wed , November 16th at 9:00 am to arrive at 8:45am. NPO after midnight. Letter mailed to patient with appointment information. Called and spoke to [place], patient's sister and let her know I have mailed a letter with the info and they can reschedule if the date is not convenient. Address verified.

## 2021-09-29 NOTE — Telephone Encounter (Signed)
-----   Message from Audrea Muscat, [place] sent at 08/24/2021  8:45 AM EDT ----- Pt needs RUQ  for [facility] screening in November

## 2021-10-10 ENCOUNTER — Ambulatory Visit: Payer: Medicaid Other | Admitting: Podiatry

## 2021-10-10 ENCOUNTER — Encounter: Payer: Self-pay | Admitting: Podiatry

## 2021-10-10 ENCOUNTER — Other Ambulatory Visit: Payer: Self-pay

## 2021-10-10 DIAGNOSIS — B351 Tinea unguium: Secondary | ICD-10-CM | POA: Diagnosis not present

## 2021-10-10 DIAGNOSIS — M79675 Pain in left toe(s): Secondary | ICD-10-CM

## 2021-10-10 DIAGNOSIS — M79674 Pain in right toe(s): Secondary | ICD-10-CM

## 2021-10-10 NOTE — Progress Notes (Signed)
  Subjective:  Patient ID: Stephen Carroll, male    DOB: [birth year],  MRN: [id number]  55 y.o. male presents painful porokeratotic lesion(s) b/l feet and painful mycotic toenails that limit ambulation. Painful toenails interfere with ambulation. Aggravating factors include wearing enclosed shoe gear. Pain is relieved with periodic professional debridement. Painful porokeratotic lesions are aggravated when weightbearing with and without shoegear. Pain is relieved with periodic professional debridement.  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  PCP is [place], Ronnette Juniper, DO.  No Known Allergies  Review of Systems: Negative except as noted in the HPI.   Objective:  Vascular Examination: CFT immediate b/l LE. Palpable DP/PT pulses b/l LE. Digital hair present b/l. Skin temperature gradient WNL b/l. No pain with calf compression b/l. No edema noted b/l. Pedal hair present. Lower extremity skin temperature gradient within normal limits.  Neurological Examination: Protective sensation intact 5/5 intact bilaterally with 10g monofilament b/l.  Dermatological Examination: Pedal skin is warm and supple b/l LE. No open wounds b/l LE. No interdigital macerations b/l lower extremities. Toenails 1-5 b/l elongated, discolored, dystrophic, thickened, crumbly with subungual debris and tenderness to dorsal palpation. Porokeratotic lesion(s) L 2nd toe, L 5th toe, R 2nd toe, R 5th toe, and posterolateral heel left foot. No erythema, no edema, no drainage, no fluctuance.  Musculoskeletal Examination: Normal muscle strength 5/5 to all lower extremity muscle groups bilaterally. HAV with bunion deformity noted b/l LE. Hammertoe deformity noted 2-5 b/l.  Radiographs: None Assessment:   1. Pain due to onychomycosis of toenails of both feet    Plan:  -Examined patient. -Patient refused to read or sign Medicaid ABN for paring of lesions. Refusal witnessed by staff member, Randal Buba, NT. Medicaid ABN scanned into chart. -Toenails 1-5 b/l were debrided in length and girth with sterile nail nippers and dremel without iatrogenic bleeding.  -Dispensed silicone toe spacers for interdigital corns b/l 2nd toes. Apply every morning. Remove every evening. -Per patient request, he will be scheduled with another Provider. -Patient/POA to call should there be question/concern in the interim.  No follow-ups on file.  Marzetta Board, DPM

## 2021-10-21 ENCOUNTER — Ambulatory Visit: Payer: Medicaid Other | Admitting: Podiatry

## 2021-10-26 ENCOUNTER — Ambulatory Visit (HOSPITAL_COMMUNITY): Payer: Medicaid Other

## 2021-11-11 ENCOUNTER — Other Ambulatory Visit: Payer: Self-pay | Admitting: Gastroenterology

## 2021-12-09 ENCOUNTER — Ambulatory Visit (HOSPITAL_COMMUNITY)
Admission: RE | Admit: 2021-12-09 | Discharge: 2021-12-09 | Disposition: A | Discharge status: Home / Self Care | Payer: Medicaid Other | Source: Ambulatory Visit | Attending: Gastroenterology | Admitting: Gastroenterology

## 2021-12-09 ENCOUNTER — Other Ambulatory Visit: Payer: Self-pay

## 2021-12-09 DIAGNOSIS — K7030 Alcoholic cirrhosis of liver without ascites: Secondary | ICD-10-CM | POA: Diagnosis not present

## 2021-12-09 DIAGNOSIS — R188 Other ascites: Secondary | ICD-10-CM | POA: Diagnosis not present

## 2021-12-09 DIAGNOSIS — K7460 Unspecified cirrhosis of liver: Secondary | ICD-10-CM | POA: Diagnosis not present

## 2022-01-20 ENCOUNTER — Other Ambulatory Visit: Payer: Self-pay

## 2022-01-20 ENCOUNTER — Encounter: Payer: Self-pay | Admitting: Podiatry

## 2022-01-20 ENCOUNTER — Ambulatory Visit: Payer: Medicaid Other | Admitting: Podiatry

## 2022-01-20 DIAGNOSIS — M2042 Other hammer toe(s) (acquired), left foot: Secondary | ICD-10-CM

## 2022-01-20 DIAGNOSIS — M2010 Hallux valgus (acquired), unspecified foot: Secondary | ICD-10-CM

## 2022-01-20 DIAGNOSIS — L84 Corns and callosities: Secondary | ICD-10-CM

## 2022-01-20 DIAGNOSIS — B351 Tinea unguium: Secondary | ICD-10-CM

## 2022-01-20 DIAGNOSIS — M79675 Pain in left toe(s): Secondary | ICD-10-CM | POA: Diagnosis not present

## 2022-01-20 DIAGNOSIS — D492 Neoplasm of unspecified behavior of bone, soft tissue, and skin: Secondary | ICD-10-CM

## 2022-01-20 DIAGNOSIS — M79674 Pain in right toe(s): Secondary | ICD-10-CM

## 2022-01-20 DIAGNOSIS — M2041 Other hammer toe(s) (acquired), right foot: Secondary | ICD-10-CM

## 2022-01-20 MED ORDER — AMMONIUM LACTATE 12 % EX LOTN: 1.0000 "application " | TOPICAL_LOTION | CUTANEOUS | 0 refills | Status: DC | PRN

## 2022-01-20 NOTE — Progress Notes (Addendum)
Complaint:  Visit Type: Patient returns to my office for continued preventative foot care services. Complaint: Patient states" my nails have grown long and thick and become painful to walk and wear shoes" Patient has severe corn pain fifth toes both feet. Painful heel callus left foot. The patient presents for preventative foot care services.  Podiatric Exam: Vascular: dorsalis pedis and posterior tibial pulses are palpable bilateral. Capillary return is immediate. Temperature gradient is WNL. Skin turgor WNL  Sensorium: Normal Semmes Weinstein monofilament test. Normal tactile sensation bilaterally. Nail Exam: Pt has thick disfigured discolored nails with subungual debris noted bilateral entire nail hallux through fifth toenails Ulcer Exam: There is no evidence of ulcer or pre-ulcerative changes or infection. Orthopedic Exam: Muscle tone and strength are WNL. No limitations in general ROM. No crepitus or effusions noted. Foot type and digits show no abnormalities. HAV 1st MPJ .  Severe adducto varus fifth toes  B/L. Skin: No Porokeratosis. No infection or ulcers.  Heel callus left foot.  Listers corn fifth toe left foot.  Diagnosis:  Onychomycosis, , Pain in right toe, pain in left toes  Callus feet  B/L.  Treatment & Plan Procedures and Treatment: Consent by patient was obtained for treatment procedures.   Debridement of mycotic and hypertrophic toenails, 1 through 5 bilateral and clearing of subungual debris using a nail nipper and dremel tool.  Debride corns with # 15 blade.  Prescribe amlactin. Return Visit-Office Procedure: Patient instructed to return to the office for a follow up visit 10 weeks  for continued evaluation and treatment.    Gardiner Barefoot DPM

## 2022-01-23 ENCOUNTER — Other Ambulatory Visit: Payer: Self-pay | Admitting: Gastroenterology

## 2022-01-23 DIAGNOSIS — Z86010 Personal history of colonic polyps: Secondary | ICD-10-CM

## 2022-02-17 ENCOUNTER — Other Ambulatory Visit: Payer: Self-pay | Admitting: Gastroenterology

## 2022-02-22 ENCOUNTER — Telehealth: Payer: Self-pay

## 2022-02-22 NOTE — Telephone Encounter (Signed)
Spoke with patient to remind him that he is due for labs at this time. No appointment is necessary. Patient is aware that he can stop by the lab in the basement at his convenience between 7:30 AM - 5 PM, Monday through Friday. I reminded of his upcoming office visit as well. Pt requested that I send him a letter with his appt information and lab reminder. He confirmed address on file. Patient verbalized understanding and had no concerns at the end of the call.  ? ?Letter mailed to pt with appt information and lab reminder. ?

## 2022-02-22 NOTE — Telephone Encounter (Signed)
-----   Message from Yevette Edwards, RN sent at 08/25/2021  9:18 AM EDT ----- ?Regarding: Labs ?Repeat CBC, CMET, PT/INR, AFP - Cirrhosis  ? ?

## 2022-02-28 ENCOUNTER — Telehealth: Payer: Self-pay

## 2022-02-28 NOTE — Telephone Encounter (Signed)
-----   Message from Yevette Edwards, RN sent at 08/31/2021 10:52 AM EDT ----- ?Regarding: Labs ?Routine labs for cirrhosis - CBC, CMET, PT/INR, AFP. Need to enter orders. ? ?

## 2022-02-28 NOTE — Telephone Encounter (Signed)
Pt received letter that was mailed on 02/22/22. He states that he has to have a ride to come to the office. He states that he will have labs drawn when he comes in for his follow up appt on 3/29. Pt had no concerns at the end of the call. ?

## 2022-03-03 ENCOUNTER — Other Ambulatory Visit: Payer: Self-pay | Admitting: Gastroenterology

## 2022-03-06 ENCOUNTER — Ambulatory Visit: Payer: Medicaid Other | Admitting: Gastroenterology

## 2022-03-08 ENCOUNTER — Ambulatory Visit (INDEPENDENT_AMBULATORY_CARE_PROVIDER_SITE_OTHER): Payer: Medicaid Other | Admitting: Gastroenterology

## 2022-03-08 ENCOUNTER — Other Ambulatory Visit (INDEPENDENT_AMBULATORY_CARE_PROVIDER_SITE_OTHER): Payer: Medicaid Other

## 2022-03-08 ENCOUNTER — Encounter: Payer: Self-pay | Admitting: Gastroenterology

## 2022-03-08 VITALS — BP 142/80 | HR 88 | Ht 68.0 in | Wt 149.2 lb

## 2022-03-08 DIAGNOSIS — K7030 Alcoholic cirrhosis of liver without ascites: Secondary | ICD-10-CM

## 2022-03-08 DIAGNOSIS — I8500 Esophageal varices without bleeding: Secondary | ICD-10-CM

## 2022-03-08 LAB — CBC WITH DIFFERENTIAL/PLATELET
Basophils Absolute: 0.1 10*3/uL (ref 0.0–0.1)
Basophils Relative: 2.3 % (ref 0.0–3.0)
Eosinophils Absolute: 0.1 10*3/uL (ref 0.0–0.7)
Eosinophils Relative: 1.7 % (ref 0.0–5.0)
HCT: 36 % — ABNORMAL LOW (ref 39.0–52.0)
Hemoglobin: 12 g/dL — ABNORMAL LOW (ref 13.0–17.0)
Lymphocytes Relative: 29.1 % (ref 12.0–46.0)
Lymphs Abs: 1.2 10*3/uL (ref 0.7–4.0)
MCHC: 33.3 g/dL (ref 30.0–36.0)
MCV: 86.2 fl (ref 78.0–100.0)
Monocytes Absolute: 0.5 10*3/uL (ref 0.1–1.0)
Monocytes Relative: 12.8 % — ABNORMAL HIGH (ref 3.0–12.0)
Neutro Abs: 2.1 10*3/uL (ref 1.4–7.7)
Neutrophils Relative %: 54.1 % (ref 43.0–77.0)
Platelets: 175 10*3/uL (ref 150.0–400.0)
RBC: 4.18 Mil/uL — ABNORMAL LOW (ref 4.22–5.81)
RDW: 15.3 % (ref 11.5–15.5)
WBC: 4 10*3/uL (ref 4.0–10.5)

## 2022-03-08 LAB — COMPREHENSIVE METABOLIC PANEL
ALT: 25 U/L (ref 0–53)
AST: 52 U/L — ABNORMAL HIGH (ref 0–37)
Albumin: 4 g/dL (ref 3.5–5.2)
Alkaline Phosphatase: 69 U/L (ref 39–117)
BUN: 4 mg/dL — ABNORMAL LOW (ref 6–23)
CO2: 28 mEq/L (ref 19–32)
Calcium: 9.4 mg/dL (ref 8.4–10.5)
Chloride: 95 mEq/L — ABNORMAL LOW (ref 96–112)
Creatinine, Ser: 0.65 mg/dL (ref 0.40–1.50)
GFR: 106.48 mL/min (ref >60.00)
Glucose, Bld: 87 mg/dL (ref 70–99)
Potassium: 4.1 mEq/L (ref 3.5–5.1)
Sodium: 132 mEq/L — ABNORMAL LOW (ref 135–145)
Total Bilirubin: 0.9 mg/dL (ref 0.2–1.2)
Total Protein: 9.5 g/dL — ABNORMAL HIGH (ref 6.0–8.3)

## 2022-03-08 LAB — PROTIME-INR
INR: 1.2 ratio — ABNORMAL HIGH (ref 0.8–1.0)
Prothrombin Time: 13.4 s — ABNORMAL HIGH (ref 9.6–13.1)
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
## 2022-03-10 LAB — AFP TUMOR MARKER: AFP-Tumor Marker: 12 ng/mL — ABNORMAL HIGH (ref <6.1)

## 2022-03-14 ENCOUNTER — Encounter (HOSPITAL_COMMUNITY): Payer: Self-pay

## 2022-03-14 ENCOUNTER — Inpatient Hospital Stay (HOSPITAL_COMMUNITY)
Admission: EM | Admit: 2022-03-14 | Discharge: 2022-03-19 | DRG: 504 | Disposition: A | Discharge status: Home / Self Care | Payer: Medicaid Other | Attending: Family Medicine | Admitting: Family Medicine

## 2022-03-14 ENCOUNTER — Ambulatory Visit (HOSPITAL_COMMUNITY)
Admission: EM | Admit: 2022-03-14 | Discharge: 2022-03-14 | Disposition: A | Discharge status: Home / Self Care | Payer: Medicaid Other

## 2022-03-14 ENCOUNTER — Emergency Department (HOSPITAL_COMMUNITY): Payer: Medicaid Other

## 2022-03-14 ENCOUNTER — Other Ambulatory Visit: Payer: Self-pay

## 2022-03-14 DIAGNOSIS — E871 Hypo-osmolality and hyponatremia: Secondary | ICD-10-CM | POA: Diagnosis present

## 2022-03-14 DIAGNOSIS — M009 Pyogenic arthritis, unspecified: Secondary | ICD-10-CM | POA: Diagnosis present

## 2022-03-14 DIAGNOSIS — L089 Local infection of the skin and subcutaneous tissue, unspecified: Secondary | ICD-10-CM | POA: Diagnosis not present

## 2022-03-14 DIAGNOSIS — Z811 Family history of alcohol abuse and dependence: Secondary | ICD-10-CM

## 2022-03-14 DIAGNOSIS — L03032 Cellulitis of left toe: Secondary | ICD-10-CM | POA: Diagnosis not present

## 2022-03-14 DIAGNOSIS — Z28310 Unvaccinated for covid-19: Secondary | ICD-10-CM

## 2022-03-14 DIAGNOSIS — D638 Anemia in other chronic diseases classified elsewhere: Secondary | ICD-10-CM | POA: Diagnosis present

## 2022-03-14 DIAGNOSIS — M2042 Other hammer toe(s) (acquired), left foot: Secondary | ICD-10-CM | POA: Diagnosis present

## 2022-03-14 DIAGNOSIS — Z79899 Other long term (current) drug therapy: Secondary | ICD-10-CM

## 2022-03-14 DIAGNOSIS — Z6372 Alcoholism and drug addiction in family: Secondary | ICD-10-CM

## 2022-03-14 DIAGNOSIS — K7030 Alcoholic cirrhosis of liver without ascites: Secondary | ICD-10-CM | POA: Diagnosis present

## 2022-03-14 DIAGNOSIS — E872 Acidosis, unspecified: Secondary | ICD-10-CM | POA: Diagnosis present

## 2022-03-14 DIAGNOSIS — L0390 Cellulitis, unspecified: Secondary | ICD-10-CM | POA: Diagnosis present

## 2022-03-14 DIAGNOSIS — I8510 Secondary esophageal varices without bleeding: Secondary | ICD-10-CM | POA: Diagnosis present

## 2022-03-14 DIAGNOSIS — R000 Tachycardia, unspecified: Secondary | ICD-10-CM | POA: Diagnosis not present

## 2022-03-14 DIAGNOSIS — M1990 Unspecified osteoarthritis, unspecified site: Secondary | ICD-10-CM | POA: Diagnosis present

## 2022-03-14 DIAGNOSIS — M86172 Other acute osteomyelitis, left ankle and foot: Secondary | ICD-10-CM | POA: Diagnosis not present

## 2022-03-14 DIAGNOSIS — M869 Osteomyelitis, unspecified: Principal | ICD-10-CM | POA: Diagnosis present

## 2022-03-14 DIAGNOSIS — F10939 Alcohol use, unspecified with withdrawal, unspecified: Secondary | ICD-10-CM | POA: Diagnosis present

## 2022-03-14 DIAGNOSIS — M7989 Other specified soft tissue disorders: Secondary | ICD-10-CM | POA: Diagnosis not present

## 2022-03-14 DIAGNOSIS — F17290 Nicotine dependence, other tobacco product, uncomplicated: Secondary | ICD-10-CM | POA: Diagnosis present

## 2022-03-14 DIAGNOSIS — F1010 Alcohol abuse, uncomplicated: Secondary | ICD-10-CM

## 2022-03-14 DIAGNOSIS — E876 Hypokalemia: Secondary | ICD-10-CM | POA: Diagnosis not present

## 2022-03-14 DIAGNOSIS — F10139 Alcohol abuse with withdrawal, unspecified: Secondary | ICD-10-CM | POA: Diagnosis present

## 2022-03-14 DIAGNOSIS — L97529 Non-pressure chronic ulcer of other part of left foot with unspecified severity: Secondary | ICD-10-CM | POA: Diagnosis present

## 2022-03-14 DIAGNOSIS — D649 Anemia, unspecified: Secondary | ICD-10-CM

## 2022-03-14 DIAGNOSIS — Z720 Tobacco use: Secondary | ICD-10-CM | POA: Diagnosis present

## 2022-03-14 DIAGNOSIS — Z9181 History of falling: Secondary | ICD-10-CM

## 2022-03-14 LAB — COMPREHENSIVE METABOLIC PANEL
ALT: 29 U/L (ref 0–44)
AST: 55 U/L — ABNORMAL HIGH (ref 15–41)
Albumin: 3.5 g/dL (ref 3.5–5.0)
Alkaline Phosphatase: 70 U/L (ref 38–126)
Anion gap: 11 (ref 5–15)
BUN: <5 mg/dL — ABNORMAL LOW (ref 6–20)
CO2: 25 mmol/L (ref 22–32)
Calcium: 9.1 mg/dL (ref 8.9–10.3)
Chloride: 94 mmol/L — ABNORMAL LOW (ref 98–111)
Creatinine, Ser: 0.73 mg/dL (ref 0.61–1.24)
GFR, Estimated: >60 mL/min (ref >60)
Glucose, Bld: 88 mg/dL (ref 70–99)
Potassium: 3.7 mmol/L (ref 3.5–5.1)
Sodium: 130 mmol/L — ABNORMAL LOW (ref 135–145)
Total Bilirubin: 0.8 mg/dL (ref 0.3–1.2)
Total Protein: 9.9 g/dL — ABNORMAL HIGH (ref 6.5–8.1)

## 2022-03-14 LAB — CBC WITH DIFFERENTIAL/PLATELET
Abs Immature Granulocytes: 0.04 10*3/uL (ref 0.00–0.07)
Basophils Absolute: 0.1 10*3/uL (ref 0.0–0.1)
Basophils Relative: 1 %
Eosinophils Absolute: 0.1 10*3/uL (ref 0.0–0.5)
Eosinophils Relative: 1 %
HCT: 33.6 % — ABNORMAL LOW (ref 39.0–52.0)
Hemoglobin: 11.5 g/dL — ABNORMAL LOW (ref 13.0–17.0)
Immature Granulocytes: 1 %
Lymphocytes Relative: 22 %
Lymphs Abs: 1.6 10*3/uL (ref 0.7–4.0)
MCH: 28.4 pg (ref 26.0–34.0)
MCHC: 34.2 g/dL (ref 30.0–36.0)
MCV: 83 fL (ref 80.0–100.0)
Monocytes Absolute: 0.8 10*3/uL (ref 0.1–1.0)
Monocytes Relative: 12 %
Neutro Abs: 4.6 10*3/uL (ref 1.7–7.7)
Neutrophils Relative %: 63 %
Platelets: 158 10*3/uL (ref 150–400)
RBC: 4.05 MIL/uL — ABNORMAL LOW (ref 4.22–5.81)
RDW: 15.1 % (ref 11.5–15.5)
WBC: 7.1 10*3/uL (ref 4.0–10.5)
nRBC: 0 % (ref 0.0–0.2)

## 2022-03-14 LAB — LACTIC ACID, PLASMA: Lactic Acid, Venous: 1.8 mmol/L (ref 0.5–1.9)

## 2022-03-14 NOTE — ED Notes (Signed)
Patient is being discharged from the Urgent Care and sent to the Emergency Department via POV . Per Mickel Baas PA, patient is in need of higher level of care due to toe infection. Patient is aware and verbalizes understanding of plan of care.  ?Vitals:  ? 03/14/22 2001  ?BP: (!) 161/99  ?Pulse: (!) 113  ?Resp: 18  ?Temp: 98 ?F (36.7 ?C)  ?SpO2: 97%  ?  ?

## 2022-03-14 NOTE — ED Triage Notes (Signed)
Pt reports he stubbed his left second toe on the corner of his bed about a month ago but ignored it. Tonight it is swollen and draining thick purulent drainage. He went to Urgent Care today and sent here. ?

## 2022-03-14 NOTE — ED Triage Notes (Signed)
Pt presents with left foot infection X 1 month after hitting against some furniture. ?
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
## 2022-03-14 NOTE — Discharge Instructions (Addendum)
-  Head to the ED. You need imaging of the foot and possible procedure to remove dead tissue. You may also require IV antibiotics. Please head straight there. ?

## 2022-03-15 ENCOUNTER — Inpatient Hospital Stay (HOSPITAL_COMMUNITY): Payer: Medicaid Other

## 2022-03-15 ENCOUNTER — Telehealth: Payer: Self-pay | Admitting: Podiatry

## 2022-03-15 DIAGNOSIS — F17290 Nicotine dependence, other tobacco product, uncomplicated: Secondary | ICD-10-CM | POA: Diagnosis present

## 2022-03-15 DIAGNOSIS — R000 Tachycardia, unspecified: Secondary | ICD-10-CM | POA: Diagnosis not present

## 2022-03-15 DIAGNOSIS — M7989 Other specified soft tissue disorders: Secondary | ICD-10-CM | POA: Diagnosis not present

## 2022-03-15 DIAGNOSIS — L03032 Cellulitis of left toe: Secondary | ICD-10-CM

## 2022-03-15 DIAGNOSIS — E872 Acidosis, unspecified: Secondary | ICD-10-CM | POA: Diagnosis present

## 2022-03-15 DIAGNOSIS — Z9181 History of falling: Secondary | ICD-10-CM | POA: Diagnosis not present

## 2022-03-15 DIAGNOSIS — Z811 Family history of alcohol abuse and dependence: Secondary | ICD-10-CM | POA: Diagnosis not present

## 2022-03-15 DIAGNOSIS — F10939 Alcohol use, unspecified with withdrawal, unspecified: Secondary | ICD-10-CM | POA: Diagnosis not present

## 2022-03-15 DIAGNOSIS — M009 Pyogenic arthritis, unspecified: Secondary | ICD-10-CM | POA: Diagnosis present

## 2022-03-15 DIAGNOSIS — L0889 Other specified local infections of the skin and subcutaneous tissue: Secondary | ICD-10-CM | POA: Diagnosis not present

## 2022-03-15 DIAGNOSIS — M869 Osteomyelitis, unspecified: Secondary | ICD-10-CM | POA: Diagnosis not present

## 2022-03-15 DIAGNOSIS — K7460 Unspecified cirrhosis of liver: Secondary | ICD-10-CM | POA: Diagnosis not present

## 2022-03-15 DIAGNOSIS — Z28310 Unvaccinated for covid-19: Secondary | ICD-10-CM | POA: Diagnosis not present

## 2022-03-15 DIAGNOSIS — M86172 Other acute osteomyelitis, left ankle and foot: Secondary | ICD-10-CM | POA: Diagnosis not present

## 2022-03-15 DIAGNOSIS — L97529 Non-pressure chronic ulcer of other part of left foot with unspecified severity: Secondary | ICD-10-CM | POA: Diagnosis present

## 2022-03-15 DIAGNOSIS — E876 Hypokalemia: Secondary | ICD-10-CM | POA: Diagnosis not present

## 2022-03-15 DIAGNOSIS — I96 Gangrene, not elsewhere classified: Secondary | ICD-10-CM | POA: Diagnosis not present

## 2022-03-15 DIAGNOSIS — L0390 Cellulitis, unspecified: Secondary | ICD-10-CM | POA: Diagnosis present

## 2022-03-15 DIAGNOSIS — Z79899 Other long term (current) drug therapy: Secondary | ICD-10-CM | POA: Diagnosis not present

## 2022-03-15 DIAGNOSIS — K7030 Alcoholic cirrhosis of liver without ascites: Secondary | ICD-10-CM | POA: Diagnosis present

## 2022-03-15 DIAGNOSIS — L089 Local infection of the skin and subcutaneous tissue, unspecified: Secondary | ICD-10-CM | POA: Diagnosis not present

## 2022-03-15 DIAGNOSIS — F10139 Alcohol abuse with withdrawal, unspecified: Secondary | ICD-10-CM | POA: Diagnosis present

## 2022-03-15 DIAGNOSIS — Z6372 Alcoholism and drug addiction in family: Secondary | ICD-10-CM | POA: Diagnosis not present

## 2022-03-15 DIAGNOSIS — M1990 Unspecified osteoarthritis, unspecified site: Secondary | ICD-10-CM | POA: Diagnosis not present

## 2022-03-15 DIAGNOSIS — D649 Anemia, unspecified: Secondary | ICD-10-CM | POA: Diagnosis not present

## 2022-03-15 DIAGNOSIS — F1010 Alcohol abuse, uncomplicated: Secondary | ICD-10-CM | POA: Diagnosis not present

## 2022-03-15 DIAGNOSIS — F10931 Alcohol use, unspecified with withdrawal delirium: Secondary | ICD-10-CM | POA: Diagnosis not present

## 2022-03-15 DIAGNOSIS — I8510 Secondary esophageal varices without bleeding: Secondary | ICD-10-CM | POA: Diagnosis present

## 2022-03-15 DIAGNOSIS — D638 Anemia in other chronic diseases classified elsewhere: Secondary | ICD-10-CM | POA: Diagnosis present

## 2022-03-15 DIAGNOSIS — M2042 Other hammer toe(s) (acquired), left foot: Secondary | ICD-10-CM | POA: Diagnosis present

## 2022-03-15 DIAGNOSIS — R601 Generalized edema: Secondary | ICD-10-CM | POA: Diagnosis not present

## 2022-03-15 DIAGNOSIS — E871 Hypo-osmolality and hyponatremia: Secondary | ICD-10-CM | POA: Diagnosis present

## 2022-03-15 LAB — PROTIME-INR
INR: 1.3 — ABNORMAL HIGH (ref 0.8–1.2)
Prothrombin Time: 15.7 seconds — ABNORMAL HIGH (ref 11.4–15.2)

## 2022-03-15 LAB — LACTIC ACID, PLASMA
Lactic Acid, Venous: 1.4 mmol/L (ref 0.5–1.9)
Lactic Acid, Venous: 2 mmol/L (ref 0.5–1.9)
Lactic Acid, Venous: 2.1 mmol/L (ref 0.5–1.9)

## 2022-03-15 LAB — HEMOGLOBIN A1C
Hgb A1c MFr Bld: 4.7 % — ABNORMAL LOW (ref 4.8–5.6)
Mean Plasma Glucose: 88.19 mg/dL

## 2022-03-15 MED ORDER — THIAMINE HCL 100 MG/ML IJ SOLN
100.0000 mg | Freq: Every day | INTRAMUSCULAR | Status: DC
  Administered 2022-03-15: 100 mg via INTRAVENOUS
  Filled 2022-03-15 (×2): qty 2

## 2022-03-15 MED ORDER — ONDANSETRON 4 MG PO TBDP
4.0000 mg | ORAL_TABLET | Freq: Once | ORAL | Status: DC
  Filled 2022-03-15: qty 1

## 2022-03-15 MED ORDER — ONDANSETRON HCL 4 MG/2ML IJ SOLN
4.0000 mg | Freq: Once | INTRAMUSCULAR | Status: AC
Start: 2022-03-15 — End: 2022-03-15
  Administered 2022-03-15: 4 mg via INTRAVENOUS
  Filled 2022-03-15: qty 2

## 2022-03-15 MED ORDER — SODIUM CHLORIDE 0.9 % IV SOLN: INTRAVENOUS | Status: DC

## 2022-03-15 MED ORDER — METRONIDAZOLE 500 MG/100ML IV SOLN
500.0000 mg | Freq: Two times a day (BID) | INTRAVENOUS | Status: DC
  Administered 2022-03-15 – 2022-03-17 (×5): 500 mg via INTRAVENOUS
  Filled 2022-03-15 (×5): qty 100

## 2022-03-15 MED ORDER — FOLIC ACID 1 MG PO TABS
1.0000 mg | ORAL_TABLET | Freq: Every day | ORAL | Status: DC
  Administered 2022-03-16 – 2022-03-19 (×4): 1 mg via ORAL
  Filled 2022-03-15 (×4): qty 1

## 2022-03-15 MED ORDER — SODIUM CHLORIDE 0.9 % IV SOLN
2.0000 g | INTRAVENOUS | Status: DC
  Administered 2022-03-15: 2 g via INTRAVENOUS
  Filled 2022-03-15: qty 20

## 2022-03-15 MED ORDER — THIAMINE HCL 100 MG PO TABS
100.0000 mg | ORAL_TABLET | Freq: Every day | ORAL | Status: DC
  Administered 2022-03-16 – 2022-03-19 (×4): 100 mg via ORAL
  Filled 2022-03-15 (×4): qty 1

## 2022-03-15 MED ORDER — LORAZEPAM 2 MG/ML IJ SOLN
1.0000 mg | Freq: Once | INTRAMUSCULAR | Status: AC
  Administered 2022-03-15: 1 mg via INTRAVENOUS
  Filled 2022-03-15: qty 1

## 2022-03-15 MED ORDER — VANCOMYCIN HCL IN DEXTROSE 1-5 GM/200ML-% IV SOLN
1000.0000 mg | Freq: Two times a day (BID) | INTRAVENOUS | Status: DC
  Administered 2022-03-15 – 2022-03-17 (×5): 1000 mg via INTRAVENOUS
  Filled 2022-03-15 (×6): qty 200

## 2022-03-15 MED ORDER — ADULT MULTIVITAMIN W/MINERALS CH
1.0000 | ORAL_TABLET | Freq: Every day | ORAL | Status: DC
  Administered 2022-03-16 – 2022-03-19 (×4): 1 via ORAL
  Filled 2022-03-15 (×4): qty 1

## 2022-03-15 MED ORDER — PROPRANOLOL HCL 20 MG PO TABS
20.0000 mg | ORAL_TABLET | Freq: Two times a day (BID) | ORAL | Status: DC
  Administered 2022-03-15 – 2022-03-19 (×8): 20 mg via ORAL
  Filled 2022-03-15 (×11): qty 1

## 2022-03-15 MED ORDER — SODIUM CHLORIDE 0.9 % IV SOLN
2.0000 g | Freq: Three times a day (TID) | INTRAVENOUS | Status: DC
  Administered 2022-03-15 – 2022-03-17 (×6): 2 g via INTRAVENOUS
  Filled 2022-03-15 (×6): qty 12.5

## 2022-03-15 MED ORDER — LORAZEPAM 2 MG/ML IJ SOLN
1.0000 mg | INTRAMUSCULAR | Status: DC | PRN
  Administered 2022-03-15: 2 mg via INTRAVENOUS
  Administered 2022-03-16: 4 mg via INTRAVENOUS
  Filled 2022-03-15: qty 2
  Filled 2022-03-15: qty 1

## 2022-03-15 MED ORDER — LORAZEPAM 1 MG PO TABS
1.0000 mg | ORAL_TABLET | ORAL | Status: DC | PRN
  Administered 2022-03-16: 2 mg via ORAL
  Administered 2022-03-16: 1 mg via ORAL
  Filled 2022-03-15: qty 1
  Filled 2022-03-15: qty 2

## 2022-03-15 MED ORDER — SODIUM CHLORIDE 0.9 % IV BOLUS
1000.0000 mL | Freq: Once | INTRAVENOUS | Status: AC
  Administered 2022-03-15: 1000 mL via INTRAVENOUS

## 2022-03-15 NOTE — ED Provider Notes (Signed)
?[facility] ?Provider Note ? ? ?CSN: [id number] ?Arrival date & time: 03/14/22  2019 ? ?  ? ?History ? ?Chief Complaint  ?Patient presents with  ? Toe Injury  ? ? ?Stephen Carroll is a 56 y.o. male presenting from home with concern for foot infection.  Reports he bumped his left foot about 1 month ago on a bedstead, but the toe began swelling with pus drainage in the past 2 days.  They went to UC yesterday and were referred to the ED for osteomyelitis evaluation.  They had triage labs last night and unfortunately spent approx 11 hours in waiting prior to my evaluation at the start of my shift this morning.  He denies fevers at home.  Denies neuropathy or hx of diabetes.  He does drink etoh daily and last drink was yesterday, is starting to feel nauseous and shaky.  His sister at bedside provides supplemental history. ? ?HPI ? ?  ? ?Home Medications ?Prior to Admission medications   ?Medication Sig Start Date End Date Taking? Authorizing Provider  ?ammonium lactate (AMLACTIN) 12 % lotion Apply 1 application topically as needed for dry skin. ?Patient taking differently: Apply 1 application. topically in the morning and at bedtime. 01/20/22 04/20/22 Yes Gardiner Barefoot, DPM  ?furosemide (LASIX) 40 MG tablet Take 1 tablet (40 mg total) by mouth daily. Please keep your appointment on March 29th 02/17/22  Yes Armbruster, Carlota Raspberry, MD  ?ondansetron (ZOFRAN) 4 MG tablet Take 1 tablet (4 mg total) by mouth every 8 (eight) hours as needed for nausea or vomiting. Please keep your appointment on March 27th for any further refills ?Patient taking differently: Take 4 mg by mouth daily as needed for nausea or vomiting. Please keep your appointment on March 27th for any further refills 01/23/22  Yes Armbruster, Carlota Raspberry, MD  ?propranolol (INDERAL) 20 MG tablet Take 1 tablet (20 mg total) by mouth 2 (two) times daily. ?Patient taking differently: Take 20 mg by mouth daily. 09/26/21  Yes Armbruster,  Carlota Raspberry, MD  ?spironolactone (ALDACTONE) 100 MG tablet Take 1 tablet (100 mg total) by mouth daily. Please keep your appointment on March 29th ?Patient taking differently: Take 100 mg by mouth 2 (two) times daily. Please keep your appointment on March 29th 02/17/22  Yes Armbruster, Carlota Raspberry, MD  ?   ? ?Allergies    ?Patient has no known allergies.   ? ?Review of Systems   ?Review of Systems ? ?Physical Exam ?Updated Vital Signs ?BP (!) 149/94 (BP Location: Right Arm)   Pulse (!) 137   Temp 97.9 ?F (36.6 ?C) (Oral)   Resp 17   SpO2 98%  ?Physical Exam ?Constitutional:   ?   General: He is not in acute distress. ?HENT:  ?   Head: Normocephalic and atraumatic.  ?Eyes:  ?   Conjunctiva/sclera: Conjunctivae normal.  ?   Pupils: Pupils are equal, round, and reactive to light.  ?Cardiovascular:  ?   Rate and Rhythm: Regular rhythm. Tachycardia present.  ?Pulmonary:  ?   Effort: Pulmonary effort is normal. No respiratory distress.  ?Abdominal:  ?   General: There is no distension.  ?   Tenderness: There is no abdominal tenderness.  ?Skin: ?   General: Skin is warm and dry.  ?   Comments: Skin breakdown, fungal infection of toes, weeping wound on left 2nd toe, see photo  ?Neurological:  ?   General: No focal deficit present.  ?   Mental  Status: He is alert. Mental status is at baseline.  ?Psychiatric:     ?   Mood and Affect: Mood normal.     ?   Behavior: Behavior normal.  ? ? ? ? ?ED Results / Procedures / Treatments   ?Labs ?(all labs ordered are listed, but only abnormal results are displayed) ?Labs Reviewed  ?LACTIC ACID, PLASMA - Abnormal; Notable for the following components:  ?    Result Value  ? Lactic Acid, Venous 2.0 (*)   ? All other components within normal limits  ?COMPREHENSIVE METABOLIC PANEL - Abnormal; Notable for the following components:  ? Sodium 130 (*)   ? Chloride 94 (*)   ? BUN <5 (*)   ? Total Protein 9.9 (*)   ? AST 55 (*)   ? All other components within normal limits  ?CBC WITH  DIFFERENTIAL/PLATELET - Abnormal; Notable for the following components:  ? RBC 4.05 (*)   ? Hemoglobin 11.5 (*)   ? HCT 33.6 (*)   ? All other components within normal limits  ?CULTURE, BLOOD (SINGLE)  ?MRSA NEXT GEN BY PCR, NASAL  ?LACTIC ACID, PLASMA  ? ? ?EKG ?None ? ?Radiology ?DG Foot Complete Left ? ?Result Date: 03/14/2022 ?CLINICAL DATA:  Foot infection. EXAM: LEFT FOOT - COMPLETE 3+ VIEW COMPARISON:  None. FINDINGS: There is marked soft tissue swelling of the second toe. There are osseous erosive changes involving the second distal and middle phalanges in the region of the second distal interphalangeal joint. No fractures are seen. Joint spaces are otherwise well maintained. IMPRESSION: 1. Findings suspicious for osteomyelitis/septic arthritis involving the second distal interphalangeal joint. There are marked osseous erosions at this level and surrounding soft tissue swelling. Electronically Signed   By: Ronney Asters M.D.   On: 03/14/2022 22:24   ? ?Procedures ?Procedures  ? ? ?Medications Ordered in ED ?Medications  ?sodium chloride 0.9 % bolus 1,000 mL (1,000 mLs Intravenous New Bag/Given 03/15/22 0810)  ?  And  ?0.9 %  sodium chloride infusion (has no administration in time range)  ?cefTRIAXone (ROCEPHIN) 2 g in sodium chloride 0.9 % 100 mL IVPB (2 g Intravenous New Bag/Given 03/15/22 0811)  ?metroNIDAZOLE (FLAGYL) IVPB 500 mg (has no administration in time range)  ?vancomycin (VANCOCIN) IVPB 1000 mg/200 mL premix (has no administration in time range)  ?LORazepam (ATIVAN) tablet 1-4 mg (has no administration in time range)  ?  Or  ?LORazepam (ATIVAN) injection 1-4 mg (has no administration in time range)  ?thiamine tablet 100 mg (has no administration in time range)  ?  Or  ?thiamine (B-1) injection 100 mg (has no administration in time range)  ?folic acid (FOLVITE) tablet 1 mg (has no administration in time range)  ?multivitamin with minerals tablet 1 tablet (has no administration in time range)  ?LORazepam  (ATIVAN) injection 1 mg (1 mg Intravenous Given 03/15/22 0811)  ?ondansetron [facility]) injection 4 mg (4 mg Intravenous Given 03/15/22 0811)  ? ? ?ED Course/ Medical Decision Making/ A&P ?Clinical Course as of 03/15/22 0846  ?Wed Mar 15, 2022  ?0845 Admitted to family service  [MT]  ?  ?Clinical Course User Index ?[MT] Wyvonnia Dusky, MD  ? ?                        ?Medical Decision Making ?Amount and/or Complexity of Data Reviewed ?Labs: ordered. ?Radiology: ordered. ? ?Risk ?OTC drugs. ?Prescription drug management. ?Decision regarding hospitalization. ? ? ?This patient presents  to the ED with concern for toe infection. This involves an extensive number of treatment options, and is a complaint that carries with it a high risk of complications and morbidity.  The differential diagnosis includes skin infection vs osteomyelitis vs other ? ?Additional history obtained from patient sister at bedside ? ?External records from outside source obtained and reviewed including outpatient pathology evaluation, most recently February, at which time the patient did not appear to have this wound per the office evaluation. ? ?I ordered and personally interpreted labs.  The pertinent results include: Lactate 2.0.  White blood cell count within normal limit.  Chronic mild anemia likely related to alcohol use ? ?I ordered imaging studies including x-ray of the foot, MRI of the ?I independently visualized and interpreted imaging which showed concern for osteomyelitis of the second left toe, distal ?I agree with the radiologist interpretation ? ? ?I ordered medication IV antibiotics for moderate foot infection given that the patient is tachycardic, does have an elevated lactate, we will send blood cultures.  However he is afebrile and has no leukocytosis, any other abnormalities may be attributable to his alcohol withdrawal, for which I have ordered Ativan as well. ? ?Test Considered:  ?-No evidence of necrotizing fasciitis or deep space  infection of the foot at this time ? ?After the interventions noted above, I reevaluated the patient and found that they have: stayed the same ? ?Social Determinants of Health: ?History of alcohol use and cirrhosis, da

## 2022-03-15 NOTE — ED Notes (Signed)
Provided pt  ?

## 2022-03-15 NOTE — ED Notes (Signed)
Date and time results received: 03/15/22 1910 ?(use smartphrase ".now" to insert current time) ? ?Test: lactic acid ?Critical Value: 2.1 ? ?Name of Provider Notified: C. Owens Shark, MD ? ? ?

## 2022-03-15 NOTE — Consult Note (Addendum)
WOC consult requested for left foot wound.   ?X-ray results indicate; "MRI foot: Destructive changes of the distal and middle phalanx of the second toe at DIP joint showing signs of osteomyelitis and septic arthritis.  Soft tissue swelling along the dorsal aspect of the second toe centered at the DIP joint with presumed abscess. Soft tissue wound/ ulceration along the periphery of the fifth toe." ?This complex medical condition is beyond the scope of practice for [facility] nurses. Progress notes indicate that podiatry team consult is pending.  Please refer to their team for further questions regarding plan of care. ?Please re-consult if further assistance is needed.  Thank-you,  ?Julien Girt MSN, RN, [place], [place], CNS ?[phone]  ? ?

## 2022-03-15 NOTE — ED Notes (Signed)
Pt ambulated to bathroom with assistance.

## 2022-03-15 NOTE — Progress Notes (Signed)
Pharmacy Antibiotic Note ? ?NIJEL Carroll is a 56 y.o. male admitted on 03/14/2022 with cellulitis.  Pharmacy has been consulted for vancomycin dosing. Pt is afebrile and WBC is WNL. Lactic acid is elevated at 2 and Scr is WNL.  ? ?Plan: ?Vancomycin 1gm IV Q12H  ?F/u renal fxn, C&S, clinical status and peak/trough at [facility] ?  ? ?Temp (24hrs), Avg:98.1 ?F (36.7 ?C), Min:97.9 ?F (36.6 ?C), Max:98.3 ?F (36.8 ?C) ? ?Recent Labs  ?Lab 03/14/22 ?2153 03/14/22 ?2332  ?WBC 7.1  --   ?CREATININE 0.73  --   ?LATICACIDVEN 1.8 2.0*  ?  ?Estimated Creatinine Clearance: 101.1 mL/min (by C-G formula based on SCr of 0.73 mg/dL).   ? ?No Known Allergies ? ?Antimicrobials this admission: ?Vanc 4/5>> ?CTX 4/5>> ?Flagyl 4/5>> ? ?Dose adjustments this admission: ?N/A ? ?Microbiology results: ?Pending ? ?Thank you for allowing pharmacy to be a part of this patientStephens care. ? ?Ayan Yankey, Rande Lawman ?03/15/2022 7:55 AM ? ?

## 2022-03-15 NOTE — Hospital Course (Addendum)
Stephen Carroll is a 56 y.o. male who was admitted to the [facility] Medicine Teaching Service at [facility] for left second toe pain. Hospital course is outlined below by system. PMH is significant for EtOH abuse, cerebral aneurysm alcohol withdrawal seizures and alcoholic cirrhosis with ascites. ? 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 Left foot xray showed findings suspicious for osteomyelitis/septic arthritis involving the second distal interphalangeal joint. Podiatry was consulted and continued with left 2nd toe amputation on 4/9. Surgery went well without complications and patient was discharged with post op shoe, doxycycline x2 weeks awaiting cultures, and is to follow up with podiatry (podiatry to schedule).  ? ?ETOH abuse  Alcoholic liver cirrhosis, esophageal varices  Alcohol withdrawal seizures ?During admission, patient placed on CIWA protocol and required Ativan for active hallucinations and aggressive behavior.  Patient was placed on Librium taper and also required a one-to-one sitter due to erratic behavior and wandering/attempting to leave.  Patient's withdrawal delayed surgical care.  Home medications of propanolol, Lasix, spironolactone were resumed after surgery. ? ?Hypokalemia ?Patient had low potassium levels for a few days which was repleted appropriately.  Potassium upon discharge was 3.7 ? 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 At baseline. This was monitored but remained stable during hospitalization. ? ?Normocytic Anemia  ?Appears to have anemia at baseline 11-12, likely ACD. Assess repeat CBC on follow up after surgery.  ? ?Issues for follow-up: ?Recommend ABI's and outpatient vascular follow-up ?Repeat BMP to assess potassium, CBC to assess anemia ?Continue to approach alcohol use disorder treatment options  ?F/u with podiatry (podiatry to call and schedule) ?Ensure completion of  doxycycline   ?Continued home diuretics  ?

## 2022-03-15 NOTE — ED Notes (Signed)
Pt ambulated to the restroom with minimum assistance. ?

## 2022-03-15 NOTE — ED Notes (Signed)
NT setup pt tray for lunch. ?

## 2022-03-15 NOTE — ED Notes (Signed)
Sister Angie Updated on plan of care and pt status  ?

## 2022-03-15 NOTE — ED Notes (Signed)
Provided pt phone to speak with sister Angie. ?

## 2022-03-15 NOTE — Telephone Encounter (Signed)
Dr Posey Pronto has been trying to reach you regarding an inpatient consult for this patient. Pt has Osteomyelitis of left foot and would like for you to see him in hospital when you can. Dr Posey Pronto will be off duty in the next 45 min but in the meantime can be reached on her cell [phone] or reach the inpatient team at [phone]. Thank you. ?
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
## 2022-03-15 NOTE — ED Notes (Signed)
RN notified Family Medicine team that pt HR has been sinus tach and it increases from 120's to 140's with movement. Pt has maintenance fluids going at this time.  ?

## 2022-03-15 NOTE — Progress Notes (Signed)
Called Dr Luane School for [facility], Podiatry for consult. Left VM. ? ?Lattie Haw MD ?PGY-3, [facility]  ?

## 2022-03-15 NOTE — ED Notes (Signed)
Pt has requested and anti nausea. Triage nurse was notified.  ?

## 2022-03-15 NOTE — ED Notes (Signed)
Pt ate two bites of spaghetti and stated he was finish. ?

## 2022-03-16 ENCOUNTER — Encounter (HOSPITAL_COMMUNITY): Payer: Self-pay | Admitting: Family Medicine

## 2022-03-16 DIAGNOSIS — E871 Hypo-osmolality and hyponatremia: Secondary | ICD-10-CM

## 2022-03-16 DIAGNOSIS — L03032 Cellulitis of left toe: Secondary | ICD-10-CM | POA: Diagnosis not present

## 2022-03-16 DIAGNOSIS — D649 Anemia, unspecified: Secondary | ICD-10-CM

## 2022-03-16 DIAGNOSIS — F10939 Alcohol use, unspecified with withdrawal, unspecified: Secondary | ICD-10-CM

## 2022-03-16 DIAGNOSIS — M009 Pyogenic arthritis, unspecified: Secondary | ICD-10-CM | POA: Diagnosis not present

## 2022-03-16 DIAGNOSIS — F1010 Alcohol abuse, uncomplicated: Secondary | ICD-10-CM

## 2022-03-16 DIAGNOSIS — M869 Osteomyelitis, unspecified: Secondary | ICD-10-CM | POA: Diagnosis not present

## 2022-03-16 LAB — COMPREHENSIVE METABOLIC PANEL
ALT: 28 U/L (ref 0–44)
AST: 53 U/L — ABNORMAL HIGH (ref 15–41)
Albumin: 3.3 g/dL — ABNORMAL LOW (ref 3.5–5.0)
Alkaline Phosphatase: 78 U/L (ref 38–126)
Anion gap: 10 (ref 5–15)
BUN: <5 mg/dL — ABNORMAL LOW (ref 6–20)
CO2: 23 mmol/L (ref 22–32)
Calcium: 9 mg/dL (ref 8.9–10.3)
Chloride: 95 mmol/L — ABNORMAL LOW (ref 98–111)
Creatinine, Ser: 0.76 mg/dL (ref 0.61–1.24)
GFR, Estimated: >60 mL/min (ref >60)
Glucose, Bld: 130 mg/dL — ABNORMAL HIGH (ref 70–99)
Potassium: 3.7 mmol/L (ref 3.5–5.1)
Sodium: 128 mmol/L — ABNORMAL LOW (ref 135–145)
Total Bilirubin: 1.7 mg/dL — ABNORMAL HIGH (ref 0.3–1.2)
Total Protein: 9.2 g/dL — ABNORMAL HIGH (ref 6.5–8.1)

## 2022-03-16 LAB — CBC
HCT: 35.1 % — ABNORMAL LOW (ref 39.0–52.0)
Hemoglobin: 12.3 g/dL — ABNORMAL LOW (ref 13.0–17.0)
MCH: 28.7 pg (ref 26.0–34.0)
MCHC: 35 g/dL (ref 30.0–36.0)
MCV: 81.8 fL (ref 80.0–100.0)
Platelets: 161 10*3/uL (ref 150–400)
RBC: 4.29 MIL/uL (ref 4.22–5.81)
RDW: 14.8 % (ref 11.5–15.5)
WBC: 9.9 10*3/uL (ref 4.0–10.5)
nRBC: 0 % (ref 0.0–0.2)

## 2022-03-16 LAB — SURGICAL PCR SCREEN
MRSA, PCR: NEGATIVE
Staphylococcus aureus: POSITIVE — AB

## 2022-03-16 LAB — PROTIME-INR
INR: 1.3 — ABNORMAL HIGH (ref 0.8–1.2)
Prothrombin Time: 15.6 seconds — ABNORMAL HIGH (ref 11.4–15.2)

## 2022-03-16 MED ORDER — ADULT MULTIVITAMIN W/MINERALS CH: 1.0000 | ORAL_TABLET | Freq: Every day | ORAL | Status: DC

## 2022-03-16 MED ORDER — EPHEDRINE 5 MG/ML INJ
INTRAVENOUS | Status: AC
  Filled 2022-03-16: qty 10

## 2022-03-16 MED ORDER — CHLORDIAZEPOXIDE HCL 25 MG PO CAPS
25.0000 mg | ORAL_CAPSULE | Freq: Four times a day (QID) | ORAL | Status: AC
  Administered 2022-03-17 (×2): 25 mg via ORAL
  Filled 2022-03-16 (×2): qty 1

## 2022-03-16 MED ORDER — CHLORDIAZEPOXIDE HCL 25 MG PO CAPS: 25.0000 mg | ORAL_CAPSULE | Freq: Two times a day (BID) | ORAL | Status: DC

## 2022-03-16 MED ORDER — ONDANSETRON HCL 4 MG/2ML IJ SOLN
INTRAMUSCULAR | Status: AC
  Filled 2022-03-16: qty 12

## 2022-03-16 MED ORDER — LOPERAMIDE HCL 2 MG PO CAPS: 2.0000 mg | ORAL_CAPSULE | ORAL | Status: DC | PRN

## 2022-03-16 MED ORDER — CHLORDIAZEPOXIDE HCL 25 MG PO CAPS
25.0000 mg | ORAL_CAPSULE | Freq: Once | ORAL | Status: AC
  Administered 2022-03-16: 25 mg via ORAL
  Filled 2022-03-16: qty 1

## 2022-03-16 MED ORDER — ROCURONIUM BROMIDE 10 MG/ML (PF) SYRINGE
PREFILLED_SYRINGE | INTRAVENOUS | Status: AC
  Filled 2022-03-16: qty 30

## 2022-03-16 MED ORDER — CHLORDIAZEPOXIDE HCL 25 MG PO CAPS
25.0000 mg | ORAL_CAPSULE | Freq: Three times a day (TID) | ORAL | Status: AC
  Administered 2022-03-17 – 2022-03-18 (×3): 25 mg via ORAL
  Filled 2022-03-16 (×3): qty 1

## 2022-03-16 MED ORDER — PHENYLEPHRINE 40 MCG/ML (10ML) SYRINGE FOR IV PUSH (FOR BLOOD PRESSURE SUPPORT)
PREFILLED_SYRINGE | INTRAVENOUS | Status: AC
  Filled 2022-03-16: qty 10

## 2022-03-16 MED ORDER — CHLORDIAZEPOXIDE HCL 25 MG PO CAPS: 25.0000 mg | ORAL_CAPSULE | Freq: Three times a day (TID) | ORAL | Status: DC

## 2022-03-16 MED ORDER — HYDROXYZINE HCL 25 MG PO TABS
25.0000 mg | ORAL_TABLET | Freq: Four times a day (QID) | ORAL | Status: DC | PRN
  Administered 2022-03-17 (×3): 25 mg via ORAL
  Filled 2022-03-16 (×3): qty 1

## 2022-03-16 MED ORDER — SUGAMMADEX SODIUM 500 MG/5ML IV SOLN
INTRAVENOUS | Status: AC
  Filled 2022-03-16: qty 10

## 2022-03-16 MED ORDER — ONDANSETRON 4 MG PO TBDP
4.0000 mg | ORAL_TABLET | Freq: Once | ORAL | Status: AC
  Administered 2022-03-16: 4 mg via ORAL
  Filled 2022-03-16: qty 1

## 2022-03-16 MED ORDER — CHLORDIAZEPOXIDE HCL 25 MG PO CAPS: 25.0000 mg | ORAL_CAPSULE | Freq: Every day | ORAL | Status: DC

## 2022-03-16 MED ORDER — CHLORDIAZEPOXIDE HCL 25 MG PO CAPS
25.0000 mg | ORAL_CAPSULE | Freq: Four times a day (QID) | ORAL | Status: DC | PRN
  Administered 2022-03-17 (×3): 25 mg via ORAL
  Filled 2022-03-16 (×4): qty 1

## 2022-03-16 MED ORDER — LIDOCAINE 2% (20 MG/ML) 5 ML SYRINGE
INTRAMUSCULAR | Status: AC
  Filled 2022-03-16: qty 20

## 2022-03-16 MED ORDER — THIAMINE HCL 100 MG/ML IJ SOLN: 100.0000 mg | Freq: Once | INTRAMUSCULAR | Status: DC

## 2022-03-16 MED ORDER — CHLORDIAZEPOXIDE HCL 25 MG PO CAPS: 25.0000 mg | ORAL_CAPSULE | Freq: Four times a day (QID) | ORAL | Status: DC | PRN

## 2022-03-16 MED ORDER — MIDAZOLAM HCL 2 MG/2ML IJ SOLN
INTRAMUSCULAR | Status: AC
  Filled 2022-03-16: qty 2

## 2022-03-16 MED ORDER — FENTANYL CITRATE (PF) 250 MCG/5ML IJ SOLN
INTRAMUSCULAR | Status: AC
  Filled 2022-03-16: qty 5

## 2022-03-16 MED ORDER — CHLORDIAZEPOXIDE HCL 25 MG PO CAPS
25.0000 mg | ORAL_CAPSULE | Freq: Four times a day (QID) | ORAL | Status: DC
  Administered 2022-03-16: 25 mg via ORAL

## 2022-03-16 MED ORDER — CHLORDIAZEPOXIDE HCL 25 MG PO CAPS
25.0000 mg | ORAL_CAPSULE | ORAL | Status: DC
  Administered 2022-03-18: 25 mg via ORAL
  Filled 2022-03-16: qty 1

## 2022-03-16 MED ORDER — ONDANSETRON 4 MG PO TBDP
4.0000 mg | ORAL_TABLET | Freq: Four times a day (QID) | ORAL | Status: DC | PRN
  Filled 2022-03-16: qty 1

## 2022-03-16 MED ORDER — DEXAMETHASONE SODIUM PHOSPHATE 10 MG/ML IJ SOLN
INTRAMUSCULAR | Status: AC
  Filled 2022-03-16: qty 4

## 2022-03-16 MED ORDER — THIAMINE HCL 100 MG PO TABS: 100.0000 mg | ORAL_TABLET | Freq: Every day | ORAL | Status: DC

## 2022-03-16 NOTE — Progress Notes (Signed)
FPTS Brief Progress Note ? ?S:Patient sleeping comfortably. ? ? ?O: ?BP (!) 158/90 (BP Location: Right Arm)   Pulse 95   Temp 98.4 ?F (36.9 ?C) (Oral)   Resp 18   Wt 85.6 kg   SpO2 98%   BMI 28.69 kg/m?   ? ? ?A/P: ?- Last CIWA 15 @ 11 pm, patient given ativan. Will continue to monitor CIWA's and treat as needed. Patient resting comfortably in room on night rounds.  ?- Orders reviewed. Labs for AM ordered, which was adjusted as needed.  ?- Plans per day team ? ?Holley Bouche, MD ?03/16/2022, 2:49 AM ?PGY-1, [facility] Night Resident  ?Please page [phone] with questions.  ? ? ?

## 2022-03-16 NOTE — Progress Notes (Signed)
Patient started to become slightly agitated around 1300. CIWA reading 5, administered Ativan '1mg'$  per order. Shortly after, patient began pacing around the room and pulled his IV out. Bed alarm activated. A&Ox2 (previously A&Ox4 this morning). Heard bed alarm going off, this RN and [place], NT responded about 20 seconds after initially alarming and patient could not be located. Unit searched, security called, and leadership made aware. Patient was found by security and escorted back to his room via wheelchair. Patient still confused and agitated, additional '2mg'$  Ativan administered.   ? ?Unfortunately, it appeared that patient ate part of a banana, so surgery was postponed until tomorrow. When OR staff brought him back to his room, he was combative and disoriented. Security called again. Ativan '4mg'$  IV administered. Dr. Lurline Hare arrived to bedside. Patient currently asleep in his room. Will continue to monitor. ?

## 2022-03-16 NOTE — Progress Notes (Signed)
Pt arrives to the unit via stretcher. Pt is AAOX4. Oral membranes are pink and moist. Pupils are PERRL. Redness to right sclera noted. Respirations are even and non-labored. S1 and s2 noted. Lung sounds are clear. Peripheral pulses are +2. Extensive dry skin noted. Open area to second toe of the left foot noted. No drainage noted. No swelling noted. Pt is able to move all extremities. Limited mobility to RUE which is at baseline. Mild tremors to upper extremities noted. Pt denies pain. Bed in lowest position. Bed alarm noted. ?
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
## 2022-03-16 NOTE — Progress Notes (Signed)
?  Transition of Care (TOC) Screening Note ? ? ?Patient Details  ?Name: Stephen Carroll ?Date of Birth: Mar 15, 1966 ? ? ?Transition of Care (TOC) CM/SW Contact:    ?Cyndi Bender, RN ?Phone Number: ?03/16/2022, 8:42 AM ? ? ? ?Transition of Care Department [facility]) has reviewed patient. Admitted for second toe osteomyelitis and septic arthritis, amputation plan 4/6. ?TOC will do substance abuse counseling  ?

## 2022-03-16 NOTE — Evaluation (Signed)
Physical Therapy Evaluation ?Patient Details ?Name: Stephen Carroll ?MRN: [id number] ?DOB: 1966-06-29 ?Today's Date: 03/16/2022 ? ?History of Present Illness ? Pt is a 56 y.o. male who presented 03/14/22 with L second toe osteomyelitis and septic arthritis.  Plan for amputation 4/6. PMH: EtOH abuse, cerebral aneurysm, alcohol withdrawal seizures and alcoholic cirrhosis with ascites ?  ?Clinical Impression ? Pt presents with condition above and deficits mentioned below, see PT Problem List. PTA, he was IND without DME, living with his sister in a 1-level house with 3 STE. Currently, pt is ambulating at a slow, but mostly steady pace without UE support, assistance, or LOB, reporting to be at his baseline. At this time, pt is not requiring follow-up PT or DME at d/c. However, his functional status and WB status may change after his planned amputation today. Thus, will plan to follow-up with acute PT tomorrow to assess any need for changes to his d/c plan.    ?   ? ?Recommendations for follow up therapy are one component of a multi-disciplinary discharge planning process, led by the attending physician.  Recommendations may be updated based on patient status, additional functional criteria and insurance authorization. ? ?Follow Up Recommendations No PT follow up (vs OPPT pending functional status post amputation 4/6) ? ?  ?Assistance Recommended at Discharge PRN (may change after amputation 4/6)  ?Patient can return home with the following ? Other (comment) (TBD) ? ?  ?Equipment Recommendations Other (comment) (TBA following planned amputation 4/6)  ?Recommendations for Other Services ?    ?  ?Functional Status Assessment Patient has had a recent decline in their functional status and demonstrates the ability to make significant improvements in function in a reasonable and predictable amount of time.  ? ?  ?Precautions / Restrictions Precautions ?Precautions: Fall (low) ?Restrictions ?Weight Bearing Restrictions: Yes ?LLE  Weight Bearing: Weight bearing as tolerated  ? ?  ? ?Mobility ? Bed Mobility ?Overal bed mobility: Modified Independent ?  ?  ?  ?  ?  ?  ?General bed mobility comments: Pt able to perform all bed mobility aspects without assistance. ?  ? ?Transfers ?Overall transfer level: Needs assistance ?Equipment used: None ?Transfers: Sit to/from Stand ?Sit to Stand: Supervision, +2 safety/equipment ?  ?  ?  ?  ?  ?General transfer comment: Supervision for safety, no LOB ?  ? ?Ambulation/Gait ?Ambulation/Gait assistance: Min guard, Supervision, +2 safety/equipment ?Gait Distance (Feet): 170 Feet ?Assistive device: None ?Gait Pattern/deviations: Step-through pattern, Decreased stride length, Decreased stance time - left, Decreased step length - right, Antalgic ?Gait velocity: reduced ?Gait velocity interpretation: 1.31 - 2.62 ft/sec, indicative of limited community ambulator ?  ?General Gait Details: Pt with slightly slow and mild antalgic gait pattern, but no LOB, even when changing head positions. Does not increase speed much when cued though. Min guard-supervision for safety ? ?Stairs ?  ?  ?  ?  ?  ? ?Wheelchair Mobility ?  ? ?Modified Rankin (Stroke Patients Only) ?  ? ?  ? ?Balance Overall balance assessment: Mild deficits observed, not formally tested ?  ?  ?  ?  ?  ?  ?  ?  ?  ?  ?  ?  ?  ?  ?  ?  ?  ?  ?   ? ? ? ?Pertinent Vitals/Pain Pain Assessment ?Pain Assessment: No/denies pain  ? ? ?Home Living Family/patient expects to be discharged to:: Private residence ?Living Arrangements: Other relatives (sister) ?Available Help at Discharge: Family;Available 24  hours/day ?Type of Home: House ?Home Access: Stairs to enter ?Entrance Stairs-Rails: Can reach both ?Entrance Stairs-Number of Steps: 3 ?  ?Home Layout: One level ?Home Equipment: Grab bars - tub/shower;Hand held shower head ?   ?  ?Prior Function Prior Level of Function : Independent/Modified Independent ?  ?  ?  ?  ?  ?  ?Mobility Comments: No falls. No AD. 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 ?  ? ? ?Hand Dominance  ?   ? ?  ?Extremity/Trunk Assessment  ? Upper Extremity Assessment ?Upper Extremity Assessment: Defer to OT evaluation ?  ? ?Lower Extremity Assessment ?Lower Extremity Assessment: LLE deficits/detail ?LLE Deficits / Details: WFL strength noted bil, denies numbness/tingling bil, wound and edema noted L 2nd toe ?  ? ?Cervical / Trunk Assessment ?Cervical / Trunk Assessment: Normal  ?Communication  ? Communication: No difficulties  ?Cognition Arousal/Alertness: Awake/alert ?Behavior During Therapy: [facility] for tasks assessed/performed ?Overall Cognitive Status: Within Functional Limits for tasks assessed ?  ?  ?  ?  ?  ?  ?  ?  ?  ?  ?  ?  ?  ?  ?  ?  ?General Comments: appears to be at baseline ?  ?  ? ?  ?General Comments   ? ?  ?Exercises    ? ?Assessment/Plan  ?  ?PT Assessment Patient needs continued PT services  ?PT Problem List Decreased balance;Decreased mobility;Decreased activity tolerance;Decreased skin integrity ? ?   ?  ?PT Treatment Interventions DME instruction;Gait training;Stair training;Functional mobility training;Therapeutic activities;Balance training;Therapeutic exercise;Neuromuscular re-education;Patient/family education   ? ?PT Goals (Current goals can be found in the Care Plan section)  ?Acute Rehab PT Goals ?Patient Stated Goal: to go home for his birthday ?PT Goal Formulation: With patient ?Time For Goal Achievement: 03/30/22 ?Potential to Achieve Goals: Good ? ?  ?Frequency Min 3X/week ?  ? ? ?Co-evaluation PT/OT/SLP Co-Evaluation/Treatment: Yes ?Reason for Co-Treatment: For patient/therapist safety;To address functional/ADL transfers;Other (comment) (unsure if pt would tolerate/allow 2 sessions) ?PT goals addressed during session: Mobility/safety with mobility;Balance ?  ?  ? ? ?  ?AM-PAC PT "6 Clicks" Mobility  ?Outcome Measure Help needed turning from your back to your side while in a flat bed  without using bedrails?: None ?Help needed moving from lying on your back to sitting on the side of a flat bed without using bedrails?: None ?Help needed moving to and from a bed to a chair (including a wheelchair)?: A Little ?Help needed standing up from a chair using your arms (e.g., wheelchair or bedside chair)?: A Little ?Help needed to walk in hospital room?: A Little ?Help needed climbing 3-5 steps with a railing? : A Little ?6 Click Score: 20 ? ?  ?End of Session Equipment Utilized During Treatment: Gait belt ?Activity Tolerance: Patient tolerated treatment well ?Patient left: in bed;with call bell/phone within reach;with bed alarm set ?Nurse Communication: Mobility status ?PT Visit Diagnosis: Unsteadiness on feet (R26.81);Other abnormalities of gait and mobility (R26.89) ?  ? ?Time: 7782-4235 ?PT Time Calculation (min) (ACUTE ONLY): 19 min ? ? ?Charges:   PT Evaluation ?$PT Eval Low Complexity: 1 Low ?  ?  ?   ? ? ?Moishe Spice, PT, DPT ?Acute Rehabilitation Services  ?Pager: [phone] ?Office: [phone] ? ? ?Stephen Carroll ?03/16/2022, 11:21 AM ? ?

## 2022-03-16 NOTE — Progress Notes (Addendum)
Family Medicine Teaching Service ?Daily Progress Note ?Intern Pager: [phone] ? ?Patient name: Stephen Carroll Medical record number: [id number] ?Date of birth: Jul 05, 1966 Age: 56 y.o. Gender: male ? ?Primary Care Provider: Lyndee Hensen, DO ?Consultants: Podiatry ?Code Status: Full ? ?Pt Overview and Major Events to Date:  ?4/5 admitted ?4/6 Left 2nd toe amputation scheduled ? ?Assessment and Plan: ?Stephen Carroll is a 56 y.o. male presenting with left second toe osteomyelitis and septic arthritis. PMH is significant for EtOH abuse, cerebral aneurysm alcohol withdrawal seizures and alcoholic cirrhosis with ascites. ? ?Left second toe osteomyelitis and septic arthritis ?A1c 4.7. BP mildly elevated to 150s/90s. On room air and remainder of vitals relatively stable. INR 1.3. ?-Podiatry following, appreciate recommendations ?-2nd toe amputation today ~5PM ?-SCDs for DVT prophylaxis ?-N.p.o. at 9 AM ?-Continue flagyl, cefepime, vancomycin. Will d/c after surgery ?-mIVF. Will d/c after surgery ?-F/u blood cx ?-PT eval after toe amputation ? ?ETOH abuse  Alcoholic liver cirrhosis, esophageal varices  Alcohol withdrawal seizures ?Required 2 mg ativan due to CIWA 15 overnight. Most recent CIWA 3. Denies withdrawal symptoms at time of assessment.  ?-CIWA monitoring ?-Ativan as needed ?-Continue propanolol 20 mg twice daily ?-Holding spironolactone and Lasix, will resume tomorrow after surgery ?-Multivitamin ?-TOC consult ?-Encouraged ethanol abstinence ?-Continue outpatient GI follow-up ? ?Chronic hyponatremia ?Na 130 on admission, baseline appears to be 127-130.  Likely secondary to beer Potomania ?-Monitor with BMP ? ?Normocytic anemia ?Hb 11.5, baseline 11-12.  ?-AM CBC ? ?FEN/GI: NPO, normal saline ?PPx: SCDs ?Dispo:Home tomorrow. Barriers include surgery today and PT recs s/p amputation.  ? ?Subjective:  ?Patient's seen and assessed at bedside.  Patient reports doing well and has no acute concerns at this time.   Patient feels ready for surgery and is eager to have it so that he can go home.  Patient reports that he would like to go home prior to his birthday.  Discussed plan and patient verbalized understanding that we would need to monitor him at least overnight and pending PT recommendations after his amputation. ? ?Objective: ?Temp:  [98 ?F (36.7 ?C)-98.4 ?F (36.9 ?C)] 98 ?F (36.7 ?C) (04/06 6384) ?Pulse Rate:  [92-169] 100 (04/06 0739) ?Resp:  [10-24] 20 (04/06 0739) ?BP: (132-208)/(84-111) 158/98 (04/06 0739) ?SpO2:  [89 %-100 %] 97 % (04/06 0739) ?Weight:  [85.6 kg] 85.6 kg (04/06 0140) ?Physical Exam: ?General: No acute distress, resting comfortably ?Cardiovascular: RRR ?Respiratory: CTA B ?Abdomen: Soft nontender to palpation ?Extremities: Warm and dry, large wound over second left toe unchanged from yesterday ? ?Laboratory: ?Recent Labs  ?Lab 03/14/22 ?2153 03/16/22 ?0336  ?WBC 7.1 9.9  ?HGB 11.5* 12.3*  ?HCT 33.6* 35.1*  ?PLT 158 161  ? ?Recent Labs  ?Lab 03/14/22 ?2153 03/16/22 ?0336  ?NA 130* 128*  ?K 3.7 3.7  ?CL 94* 95*  ?CO2 25 23  ?BUN <5* <5*  ?CREATININE 0.73 0.76  ?CALCIUM 9.1 9.0  ?PROT 9.9* 9.2*  ?BILITOT 0.8 1.7*  ?ALKPHOS 70 78  ?ALT 29 28  ?AST 55* 53*  ?GLUCOSE 88 130*  ? ? ? ? ?Imaging/Diagnostic Tests: ?No results found. ? ?France Ravens, MD ?03/16/2022, 9:53 AM ?PGY-1, [facility] ?[place] Intern pager: [phone], text pages welcome ?

## 2022-03-16 NOTE — Progress Notes (Signed)
FPTS Interim Progress Note ? ?Late note.  Paged at 1430 that patient was off of unit.  Dr. Lurline Hare and I were unable to locate patient.  He was returned to unit by security after being found to picnic tables.  We will still plan for surgery today.  Appreciate podiatry consultation and care.  After the OR we will need PT OT to assess prior to ensuring a safe discharge.  He may need a postop shoe or other orthotic to optimize his mobility postoperatively.  Continue to monitor, continue CIWA's. ? ?Stephen Singh, MD  ?Family Medicine Teaching Service  ? ?

## 2022-03-16 NOTE — Progress Notes (Addendum)
FPTS Interim Progress Note ?Paged that patient returned from OR due to eating a banana this afternoon and CIWA 26.  Patient was seen and assessed at bedside.  2 security guards and nurse were at bedside.  Patient was actively hallucinating and reported he was "smoking a blunt". Patient is also alert only to self which is a stark contrast to this morning.  Patient has no understanding of why he is here.  Patient has received 7 mg of Ativan in past 4 hours per CIWA protocol as well as behaving erratically including eloping from unit. ? ?Plan: ?-Likely due to severe alcohol withdrawal ?-PCCM consulted.  They will be assessing him soon and provide their recommendation. ?-Plan for toe amputation tomorrow if able ? ?France Ravens, MD ?PGY1 Resident ?

## 2022-03-16 NOTE — Progress Notes (Signed)
FPTS Interim Progress Note ? ?S:Notified by nurse that patient attempted to climb out of bed then urinated all over the floor which she believes was in attempt of him going to the restroom. Went to assess patient alongside Dr. Marcina Millard, he was resting comfortably in bed and awaken to our entrance. I encouraged him to stay in bed and if he needs something to inform the nurse with the call button so that he can rest in anticipation of surgery. Discussed with nurse who placed condom cath to ensure we have bed alarm on.   ? ?O: ?BP 132/87 (BP Location: Right Arm)   Pulse 84   Temp 98.7 ?F (37.1 ?C) (Oral)   Resp 18   Wt 85.6 kg   SpO2 100%   BMI 28.69 kg/m?   ?General: Resting comfortably in bed, in no acute distress. ?Resp: normal work of breathing, no signs of respiratory distress ?Neuro: no tremors noted  ?Psych: mood appropriate, no agitated  ? ? ?A/P: ?Vitals and orders reviewed. Patient on librium taper which we will continue along with continuing to monitor CIWAs with last one being 20. CIWA per my assessment 6. Bed alarm on and discussed with patient to stay in bed. NPO at midnight which both patient and nurse are aware of pending surgical intervention. Encouraged nurse to contact FPTS for further concerns with pager number given [phone].   ? Donney Dice, DO ?03/16/2022, 11:53 PM ?PGY-2, [place] ?Service pager [phone]  ?

## 2022-03-16 NOTE — Evaluation (Addendum)
Occupational Therapy Evaluation and Discharge ?Patient Details ?Name: Stephen Carroll ?MRN: [id number] ?DOB: 1966-10-28 ?Today's Date: 03/16/2022 ? ? ?History of Present Illness Pt is a 56 y.o. male who presented 03/14/22 with L second toe osteomyelitis and septic arthritis.  Plan for amputation 4/6. PMH: EtOH abuse, cerebral aneurysm, alcohol withdrawal seizures and alcoholic cirrhosis with ascites  ? ?Clinical Impression ?  ?Pt admitted with above and is near baseline from a mobility standpoint and is at baseline for self-care tasks.  Pt was independent without AD PTA, living with his sister who helps him out with transportation only.  Pt completed functional transfers and grooming tasks with initial supervision fading to Independent. Pt able to complete LB dressing seated in figure 4 position.  Of note, pt to have amputation this afternoon but should still be at baseline for ADLs.  OT will sign off. ?   ? ?Recommendations for follow up therapy are one component of a multi-disciplinary discharge planning process, led by the attending physician.  Recommendations may be updated based on patient status, additional functional criteria and insurance authorization.  ? ?Follow Up Recommendations ? No OT follow up  ?  ?Assistance Recommended at Discharge PRN  ?Patient can return home with the following A little help with walking and/or transfers;Assist for transportation ? ?  ?Functional Status Assessment ? Patient has not had a recent decline in their functional status  ?Equipment Recommendations ? None recommended by OT  ?  ?   ?Precautions / Restrictions Precautions ?Precautions: Fall (low) ?Restrictions ?Weight Bearing Restrictions: Yes ?LLE Weight Bearing: Weight bearing as tolerated  ? ?  ? ?Mobility Bed Mobility ?Overal bed mobility: Modified Independent ?  ?  ?  ?  ?  ?  ?General bed mobility comments: Pt able to perform all bed mobility aspects without assistance. ?  ? ?Transfers ?Overall transfer level: Needs  assistance ?Equipment used: None ?Transfers: Sit to/from Stand ?Sit to Stand: Supervision, +2 safety/equipment ?  ?  ?  ?  ?  ?General transfer comment: Supervision for safety, no LOB ?  ? ?  ?Balance Overall balance assessment: Mild deficits observed, not formally tested ?  ?  ?  ?  ?  ?  ?  ?  ?  ?  ?  ?  ?  ?  ?  ?  ?  ?  ?   ? ?ADL either performed or assessed with clinical judgement  ? ?ADL Overall ADL's : Independent ?  ?  ?  ?  ?  ?  ?  ?  ?  ?  ?  ?  ?  ?  ?  ?  ?  ?  ?  ?General ADL Comments: Pt completed sit > stand with initial supervision fading to Independent.  Pt completed grooming tasks in standing without any assistance and no increase in pain.  Pt able to don/doff B socks without assitance and no LOB with dynamic sitting or standing.  ? ? ? ?Vision Baseline Vision/History: 0 No visual deficits ?Patient Visual Report: No change from baseline ?Vision Assessment?: No apparent visual deficits  ?   ?   ?   ? ?Pertinent Vitals/Pain Pain Assessment ?Pain Assessment: No/denies pain  ? ? ? ?   ?Extremity/Trunk Assessment Upper Extremity Assessment ?Upper Extremity Assessment: Overall WFL for tasks assessed ?  ?Lower Extremity Assessment ?Lower Extremity Assessment: LLE deficits/detail ?LLE Deficits / Details: WFL strength noted bil, denies numbness/tingling bil, wound and edema noted L 2nd toe ?  ?  Cervical / Trunk Assessment ?Cervical / Trunk Assessment: Normal ?  ?Communication Communication ?Communication: No difficulties ?  ?Cognition Arousal/Alertness: Awake/alert ?Behavior During Therapy: [facility] for tasks assessed/performed ?Overall Cognitive Status: Within Functional Limits for tasks assessed ?  ?  ?  ?  ?  ?  ?  ?  ?  ?  ?  ?  ?  ?  ?  ?  ?General Comments: appears to be at baseline ?  ?  ?   ?   ?   ? ? ?Home Living Family/patient expects to be discharged to:: Private residence ?Living Arrangements: Other relatives (sister) ?Available Help at Discharge: Family;Available 24 hours/day ?Type of Home:  House ?Home Access: Stairs to enter ?Entrance Stairs-Number of Steps: 3 ?Entrance Stairs-Rails: Can reach both ?Home Layout: One level ?  ?  ?Bathroom Shower/Tub: Tub/shower unit ?  ?Bathroom Toilet: Standard ?  ?  ?Home Equipment: Grab bars - tub/shower;Hand held shower head ?  ?  ?  ? ?  ?Prior Functioning/Environment Prior Level of Function : Independent/Modified Independent ?  ?  ?  ?  ?  ?  ?Mobility Comments: No falls. No AD. 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  Is Independent with all ADLs ?  ? ?  ?  ?OT Problem List: Decreased activity tolerance;Impaired balance (sitting and/or standing) ?  ?   ?   ?OT Goals(Current goals can be found in the care plan section) Acute Rehab OT Goals ?Patient Stated Goal: to go home tomorrow - [birth year] my [birth year] ?OT Goal Formulation: All assessment and education complete, DC therapy  ?OT Frequency:   ?  ? ?Co-evaluation PT/OT/SLP Co-Evaluation/Treatment: Yes ?Reason for Co-Treatment: For patient/therapist safety;Necessary to address cognition/behavior during functional activity;Other (comment) (unsure if pt would allow/tolerate 2 sessions) ?PT goals addressed during session: Mobility/safety with mobility;Balance ?OT goals addressed during session: ADL's and self-care ?  ? ?  ?AM-PAC OT "6 Clicks" Daily Activity     ?Outcome Measure Help from another person eating meals?: None ?Help from another person taking care of personal grooming?: None ?Help from another person toileting, which includes using toliet, bedpan, or urinal?: A Little ?Help from another person bathing (including washing, rinsing, drying)?: None ?Help from another person to put on and taking off regular upper body clothing?: None ?Help from another person to put on and taking off regular lower body clothing?: None ?6 Click Score: 23 ?  ?End of Session Equipment Utilized During Treatment: Gait belt ?Nurse Communication: Mobility status ? ?Activity Tolerance: Patient tolerated  treatment well;No increased pain ?Patient left: in bed;with call bell/phone within reach;with bed alarm set ? ?OT Visit Diagnosis: Unsteadiness on feet (R26.81)  ?              ?Time: 1779-3903 ?OT Time Calculation (min): 18 min ?Charges:  OT General Charges ?$OT Visit: 1 Visit ?OT Evaluation ?$OT Eval Low Complexity: 1 Low ? ?Simonne Come, [place], OTR/L ?[phone] ? ?Simonne Come ?03/16/2022, 11:32 AM ?

## 2022-03-17 DIAGNOSIS — F10931 Alcohol use, unspecified with withdrawal delirium: Secondary | ICD-10-CM | POA: Diagnosis not present

## 2022-03-17 DIAGNOSIS — M869 Osteomyelitis, unspecified: Secondary | ICD-10-CM | POA: Diagnosis not present

## 2022-03-17 DIAGNOSIS — D649 Anemia, unspecified: Secondary | ICD-10-CM | POA: Diagnosis not present

## 2022-03-17 DIAGNOSIS — L03032 Cellulitis of left toe: Secondary | ICD-10-CM | POA: Diagnosis not present

## 2022-03-17 LAB — COMPREHENSIVE METABOLIC PANEL
ALT: 28 U/L (ref 0–44)
AST: 45 U/L — ABNORMAL HIGH (ref 15–41)
Albumin: 2.9 g/dL — ABNORMAL LOW (ref 3.5–5.0)
Alkaline Phosphatase: 65 U/L (ref 38–126)
Anion gap: 9 (ref 5–15)
BUN: <5 mg/dL — ABNORMAL LOW (ref 6–20)
CO2: 23 mmol/L (ref 22–32)
Calcium: 8.9 mg/dL (ref 8.9–10.3)
Chloride: 99 mmol/L (ref 98–111)
Creatinine, Ser: 0.58 mg/dL — ABNORMAL LOW (ref 0.61–1.24)
GFR, Estimated: >60 mL/min (ref >60)
Glucose, Bld: 107 mg/dL — ABNORMAL HIGH (ref 70–99)
Potassium: 3.1 mmol/L — ABNORMAL LOW (ref 3.5–5.1)
Sodium: 131 mmol/L — ABNORMAL LOW (ref 135–145)
Total Bilirubin: 1.8 mg/dL — ABNORMAL HIGH (ref 0.3–1.2)
Total Protein: 8.2 g/dL — ABNORMAL HIGH (ref 6.5–8.1)

## 2022-03-17 LAB — CBC
HCT: 34.3 % — ABNORMAL LOW (ref 39.0–52.0)
Hemoglobin: 11.8 g/dL — ABNORMAL LOW (ref 13.0–17.0)
MCH: 28.4 pg (ref 26.0–34.0)
MCHC: 34.4 g/dL (ref 30.0–36.0)
MCV: 82.5 fL (ref 80.0–100.0)
Platelets: 156 10*3/uL (ref 150–400)
RBC: 4.16 MIL/uL — ABNORMAL LOW (ref 4.22–5.81)
RDW: 14.7 % (ref 11.5–15.5)
WBC: 10.9 10*3/uL — ABNORMAL HIGH (ref 4.0–10.5)
nRBC: 0 % (ref 0.0–0.2)

## 2022-03-17 LAB — RAPID URINE DRUG SCREEN, HOSP PERFORMED
Amphetamines: NOT DETECTED
Barbiturates: NOT DETECTED
Benzodiazepines: POSITIVE — AB
Cocaine: NOT DETECTED
Opiates: NOT DETECTED
Tetrahydrocannabinol: POSITIVE — AB

## 2022-03-17 MED ORDER — HEPARIN SODIUM (PORCINE) 5000 UNIT/ML IJ SOLN
5000.0000 [IU] | Freq: Three times a day (TID) | INTRAMUSCULAR | Status: DC
  Administered 2022-03-17 – 2022-03-18 (×3): 5000 [IU] via SUBCUTANEOUS
  Filled 2022-03-17 (×3): qty 1

## 2022-03-17 MED ORDER — POTASSIUM CHLORIDE 10 MEQ/100ML IV SOLN
10.0000 meq | INTRAVENOUS | Status: AC
  Administered 2022-03-17 (×6): 10 meq via INTRAVENOUS
  Filled 2022-03-17 (×6): qty 100

## 2022-03-17 MED ORDER — DOXYCYCLINE HYCLATE 100 MG PO TABS
100.0000 mg | ORAL_TABLET | Freq: Two times a day (BID) | ORAL | Status: DC
  Administered 2022-03-17 – 2022-03-19 (×5): 100 mg via ORAL
  Filled 2022-03-17 (×5): qty 1

## 2022-03-17 MED ORDER — MUPIROCIN 2 % EX OINT
1.0000 "application " | TOPICAL_OINTMENT | Freq: Two times a day (BID) | CUTANEOUS | Status: DC
  Administered 2022-03-17 – 2022-03-19 (×5): 1 via NASAL
  Filled 2022-03-17 (×4): qty 22

## 2022-03-17 MED ORDER — MELATONIN 5 MG PO TABS
5.0000 mg | ORAL_TABLET | Freq: Every day | ORAL | Status: DC
  Administered 2022-03-17 – 2022-03-18 (×2): 5 mg via ORAL
  Filled 2022-03-17 (×2): qty 1

## 2022-03-17 MED ORDER — CHLORHEXIDINE GLUCONATE CLOTH 2 % EX PADS
6.0000 | MEDICATED_PAD | Freq: Every day | CUTANEOUS | Status: DC
  Administered 2022-03-17 – 2022-03-19 (×3): 6 via TOPICAL

## 2022-03-17 NOTE — Progress Notes (Signed)
FPTS Interim Progress Note ? ?S:Received page regarding patient attempting to get out of bed and agitated. Went to bedside alongside Dr. Marcina Millard to assess the situation. Patient resting comfortably in bed with both sister and niece at bedside.  ? ?O: ?BP 116/81   Pulse 98   Temp 98.1 ?F (36.7 ?C) (Oral)   Resp 18   Wt 85.6 kg   SpO2 100%   BMI 28.69 kg/m?   ?General: Patient sitting comfortably in bed, in no acute distress. ?Resp: normal work of breathing ?Neuro: AOx1 (thinks he is at home and it is 2006) ?Psych: mild anxiety without agitation noted  ? ?A/P: ?Patient with alcohol withdrawal, due for scheduled librium dose. Will hold off on prn dose for now. Discussed plan with both sister and niece extensively. Sitter also at bedside which we will need to continue. Also called nurse to discuss plan and encouraged her to call FPTS if she has any further questions or concerns throughout the night. Will continue to carefully monitor CIWAs, remainder of plan per day team.  ? Donney Dice, DO ?03/17/2022, 8:23 PM ?PGY-2, [place] ?Service pager [phone]  ?

## 2022-03-17 NOTE — Progress Notes (Signed)
Family Medicine Teaching Service ?Daily Progress Note ?Intern Pager: [phone] ? ?Patient name: Stephen Carroll Medical record number: [id number] ?Date of birth: October 03, 1966 Age: 56 y.o. Gender: male ? ?Primary Care Provider: Lyndee Hensen, DO ?Consultants: Podiatry ?Code Status: full ? ?Pt Overview and Major Events to Date:  ?4/5 admitted ?4/6 started librium taper ?4/7 Left 2nd toe amputation scheduled ? ?Assessment and Plan: ?Stephen Carroll is a 56 y.o. male presenting with left second toe osteomyelitis, septic arthritis, and severe alcohol withdrawal. PMH is significant for EtOH abuse, cerebral aneurysm alcohol withdrawal seizures and alcoholic cirrhosis with ascites. ?  ?Left second toe osteomyelitis and septic arthritis ?A1c 4.7. VSS on room air. ?-Podiatry following, appreciate recommendations ?-Toe amputation scheduled for today ?-NPO, sip with meds ?-f/u Bcx  ?-mIVF, d/c after surgery ?-IV abx, d/c after surgery ?-PT eval after surgery ?-SCDs ? ?ETOH abuse  Alcoholic liver cirrhosis, esophageal varices  Alcohol withdrawal seizures ?Eventful day yesterday. Still confused and agitated. Highest CIWA 26 yesterday. Downtrending to 9 now. Placed on librium taper due to severe alcohol withdrawal. ?-CIWA monitoring ?-Continue propranolol 20 mg bid ?-Holding spironolactone and Lasix, will resume tomorrow after surgery ?-Multivitamin ?-TOC consult ?-Encourage ethanol abstinence ?-Continue outpatient GI follow-up ? ?Hypokalemia ?K 3.1, repleted with 60 meq IV KCl ?-AM BMP ? ?Chronic hyponatremia ?Na improving with mIVF ?-AM BMP ? ?Normocytic Anemia, stable ?-AM CBC  ? ?FEN/GI: NPO, sip with med ?PPx: SCDs ?Dispo:Home in 2-3 days. Barriers include alcohol withdrawal and amputation.  ? ?Subjective:  ?Patient seen and assessed at bedside. Still confused. States he's here because security caught him due to public indecency wearing only hospital top. Very tangential. Unable to recall things that happen that was discussed  1 minute before. ? ?Objective: ?Temp:  [96.5 ?F (35.8 ?C)-98.7 ?F (37.1 ?C)] 98.2 ?F (36.8 ?C) (04/07 0306) ?Pulse Rate:  [74-100] 74 (04/07 0306) ?Resp:  [17-20] 18 (04/07 0306) ?BP: (128-166)/(78-98) 128/83 (04/07 0306) ?SpO2:  [97 %-100 %] 100 % (04/06 1528) ?Physical Exam: ?General: Laying in bed. NAD.  ?Cardiovascular: RRR ?Respiratory: CTAB ?Abdomen: soft nontender ?Extremities: warm dry ?Psych: possibly confabulating, poor memory. Alert and oriented to self, time but states he's in a "group home" and is here for public indecency ? ?Laboratory: ?Recent Labs  ?Lab 03/14/22 ?2153 03/16/22 ?0336 03/17/22 ?0434  ?WBC 7.1 9.9 10.9*  ?HGB 11.5* 12.3* 11.8*  ?HCT 33.6* 35.1* 34.3*  ?PLT 158 161 156  ? ?Recent Labs  ?Lab 03/14/22 ?2153 03/16/22 ?0336 03/17/22 ?0434  ?NA 130* 128* 131*  ?K 3.7 3.7 3.1*  ?CL 94* 95* 99  ?CO2 '25 23 23  '$ ?BUN <5* <5* <5*  ?CREATININE 0.73 0.76 0.58*  ?CALCIUM 9.1 9.0 8.9  ?PROT 9.9* 9.2* 8.2*  ?BILITOT 0.8 1.7* 1.8*  ?ALKPHOS 70 78 65  ?ALT '29 28 28  '$ ?AST 55* 53* 45*  ?GLUCOSE 88 130* 107*  ? ? ? ? ?Imaging/Diagnostic Tests: ?No results found. ? ?France Ravens, MD ?03/17/2022, 6:33 AM ?PGY-1, [place] [facility] ?[place] Intern pager: [phone], text pages welcome ?

## 2022-03-17 NOTE — Progress Notes (Signed)
CSW acknowledges SA consult. Will address once patient more oriented.  ? ?Cedric Fishman ?LCSW, MSW, MHA ? ?

## 2022-03-17 NOTE — Progress Notes (Signed)
FPTS Brief Progress Note ? ?S:Received page that patient is agitated requiring more sedation, with CIWA score of 24. Went to see patient with Dr. Larae Grooms, found patient sitting comfortably, peacefully in bed, with disorientation. Spoke with sitter who mentioned that patient was fine and calm, but had been agitated/confused, looking for his phone. Spoke with nurse who reported that patient had been confused and unpleasant to her. Nurse also noted that she had given a PRN dose of librium at the start of her shift. Patient at this time is calm and redirectable.  ? ?O: ?BP (!) 148/79 (BP Location: Right Arm)   Pulse 83   Temp 98.4 ?F (36.9 ?C) (Oral)   Resp 18   Wt 85.6 kg   SpO2 100%   BMI 28.69 kg/m?   ?General: Well appearing, NAD, chatty, disoriented ?Neuro: AxO to self ? ?A/P: ?Re-scored patient CIWA as an 8, with no need for dose of librium at this time. Patient is calm and disoriented at this time, but easily redirectable. Patient received 25 mg per taper at 2117, and a prn dose at 1932. Spoke with nurse about talking to team before administering PRN dose. Dr. Larae Grooms called [facility], per protocol, and discussed patient with team, they will come and reasses patient. Next prn librium dose available at 0132, next scheduled dose at 1000. Will give melatonin for sleep.  ?- Monitor CIWAs ?- Melatonin 5 mg for sleep ?- Call FPTS before administering prn librium ?- Call FPTS if patient not-redirectable  ?- Plans per day team ? ? ?Holley Bouche, MD ?03/17/2022, 11:43 PM ?PGY-1, [place] [facility] Night Resident  ?Please page [phone] with questions.  ? ? ?

## 2022-03-17 NOTE — Progress Notes (Addendum)
Physical Therapy Treatment ?Patient Details ?Name: Stephen Carroll ?MRN: [id number] ?DOB: [birth year] ?Today's Date: 03/17/2022 ? ? ?History of Present Illness Pt is a 56 y.o. male who presented 03/14/22 with L second toe osteomyelitis and septic arthritis.  Plan for amputation 4/6. PMH: EtOH abuse, cerebral aneurysm, alcohol withdrawal seizures and alcoholic cirrhosis with ascites ? ?  ?PT Comments  ? ? Pt with a change in cognitive status this date and reduced balance. Pt disoriented to his own birthday, situation, location, and date. Despite repeatedly re-orienting pt, he displayed poor recall/memory. He was still able to ambulate at a min guard assist level without UE support, but displayed increased instability. This place him at risk for falls. In addition, his cognitive deficits place him at risk for injuries, like if he were to forget to turn off the stove or make a mistake with finances or medications. At this time, pt would need constant assistance/supervision at home for a safe d/c plan, but the sister reports she works and no one else can be with him during the day. If he goes home and his cognition does not improve to a level he would be safe to be home alone he would need [place] services to care for him while the sister is at work. Will continue to follow acutely. Updated d/c recs to HHPT. Will continue to assess d/c recs as pt's cog status and WB status potentially change. Recommend re-consult of OT due to his change in cognition. ? ?  ?Recommendations for follow up therapy are one component of a multi-disciplinary discharge planning process, led by the attending physician.  Recommendations may be updated based on patient status, additional functional criteria and insurance authorization. ? ?Follow Up Recommendations ? Home health PT (and [facility] services pending cognition and weight bearing/functional status s/p surgery) ?  ?  ?Assistance Recommended at Discharge Frequent or constant Supervision/Assistance (pending  progress in cognition)  ?Patient can return home with the following A little help with walking and/or transfers;Assistance with cooking/housework;Direct supervision/assist for medications management;Direct supervision/assist for financial management;Assist for transportation;Help with stairs or ramp for entrance ?  ?Equipment Recommendations ? Other (comment) (TBA following planned amputation)  ?  ?Recommendations for Other Services   ? ? ?  ?Precautions / Restrictions Precautions ?Precautions: Fall ?Restrictions ?Weight Bearing Restrictions: Yes ?LLE Weight Bearing: Weight bearing as tolerated  ?  ? ?Mobility ? Bed Mobility ?Overal bed mobility: Modified Independent ?  ?  ?  ?  ?  ?  ?General bed mobility comments: Pt able to perform all bed mobility aspects without assistance. ?  ? ?Transfers ?Overall transfer level: Needs assistance ?Equipment used: None ?Transfers: Sit to/from Stand ?Sit to Stand: Min guard ?  ?  ?  ?  ?  ?General transfer comment: Trunk sway noted coming to stand, min guard for safety ?  ? ?Ambulation/Gait ?Ambulation/Gait assistance: Min guard ?Gait Distance (Feet): 260 Feet ?Assistive device: None ?Gait Pattern/deviations: Step-through pattern, Decreased stride length, Decreased stance time - left, Decreased step length - right, Antalgic, Shuffle ?Gait velocity: reduced ?Gait velocity interpretation: <1.8 ft/sec, indicate of risk for recurrent falls ?  ?General Gait Details: Pt with slightly slow and mild antalgic shuffling gait pattern. staggers slightly when changing head positions today, but no LOB, min guard for safety. Cues provided to increase feet clearance with momentary success ? ? ?Stairs ?  ?  ?  ?  ?  ? ? ?Wheelchair Mobility ?  ? ?Modified Rankin (Stroke Patients Only) ?  ? ? ?  ?  Balance Overall balance assessment: Mild deficits observed, not formally tested ?  ?  ?  ?  ?  ?  ?  ?  ?  ?  ?  ?  ?  ?  ?  ?  ?  ?  ?  ? ?  ?Cognition Arousal/Alertness: Awake/alert ?Behavior During  Therapy: Flat affect ?Overall Cognitive Status: Impaired/Different from baseline ?Area of Impairment: Orientation, Attention, Memory, Following commands, Safety/judgement, Awareness, Problem solving ?  ?  ?  ?  ?  ?  ?  ?  ?Orientation Level: Disoriented to, Person, Place, Situation, Time ?Current Attention Level: Sustained ?Memory: Decreased short-term memory ?Following Commands: Follows one step commands consistently ?Safety/Judgement: Decreased awareness of deficits, Decreased awareness of safety ?Awareness: Emergent ?Problem Solving: Slow processing ?General Comments: Pt reporting his birthday was [birth year] or [birth year], pt was unsure which one when in reality it is [birth year]. Pt disoriented to date, stating it was April 9 or 10, 2000 and then stated "19 something" for the year. Disoriented to location initially but recalled [place] later in session. Disoriented to situation stating it was "to take a walk". Re-oriented multiple times with little to no carryover on everything except the location. Flat affect and slow processing. ?  ?  ? ?  ?Exercises   ? ?  ?General Comments General comments (skin integrity, edema, etc.): sister reports she works and no one else can be with him during the day and reports a need for [facility] services if he d/c's home, notified case Freight forwarder and social worker ?  ?  ? ?Pertinent Vitals/Pain Pain Assessment ?Pain Assessment: Faces ?Faces Pain Scale: Hurts a little bit ?Pain Location: L foot ?Pain Descriptors / Indicators: Discomfort, Guarding ?Pain Intervention(s): Limited activity within patient's tolerance, Monitored during session, Repositioned  ? ? ?Home Living   ?  ?  ?  ?  ?  ?  ?  ?  ?  ?   ?  ?Prior Function    ?  ?  ?   ? ?PT Goals (current goals can now be found in the care plan section) Acute Rehab PT Goals ?Patient Stated Goal: to go home soon ?PT Goal Formulation: With patient ?Time For Goal Achievement: 03/30/22 ?Potential to Achieve Goals: Good ?Progress towards PT goals: Progressing toward  goals ? ?  ?Frequency ? ? ? Min 3X/week ? ? ? ?  ?PT Plan Discharge plan needs to be updated  ? ? ?Co-evaluation   ?  ?  ?  ?  ? ?  ?AM-PAC PT "6 Clicks" Mobility   ?Outcome Measure ? Help needed turning from your back to your side while in a flat bed without using bedrails?: None ?Help needed moving from lying on your back to sitting on the side of a flat bed without using bedrails?: None ?Help needed moving to and from a bed to a chair (including a wheelchair)?: A Little ?Help needed standing up from a chair using your arms (e.g., wheelchair or bedside chair)?: A Little ?Help needed to walk in hospital room?: A Little ?Help needed climbing 3-5 steps with a railing? : A Little ?6 Click Score: 20 ? ?  ?End of Session Equipment Utilized During Treatment: Gait belt ?Activity Tolerance: Patient tolerated treatment well ?Patient left: in bed;with call bell/phone within reach;with bed alarm set ?Nurse Communication: Mobility status ?PT Visit Diagnosis: Unsteadiness on feet (R26.81);Other abnormalities of gait and mobility (R26.89) ?  ? ? ?Time: 9562-1308 ?PT Time Calculation (min) (ACUTE ONLY): 14 min ? ?  Charges:  $Gait Training: 8-22 mins          ?          ? ?Moishe Spice, PT, DPT ?Acute Rehabilitation Services  ?Pager: [phone] ?Office: [phone] ? ? ? ?Maretta Bees Pettis ?03/17/2022, 4:57 PM ? ?

## 2022-03-17 NOTE — Progress Notes (Signed)
Received bedside report at 23. Pt resting with eyes closed. No distress noted. Looked in on pt about every 20-30 min and was sleeping, (NT also sitting outside his rm) then he woke up for the first time and climbed out of bed and slipped in his urine on the floor landing on his butt before the NT could get in the rm and to him (pt had climbed over the SR closest to the window). No injury noted and pt cleaned up and returned to bed Condom cath put on at that time and explained to pt. Pt given Librium at this time and bed alarm was changed to a more sensitive level. He took a couple hour napped. Was checked on every 30 min and the NT was sitting outside his door and he woke up and attempted to climb out of bed and pt was agitated and talking to himself. Was given another dose of Librium which wasn't effective at all. Called pt's sister and she talked to him but if she didn't agree with him he just got more agitated. Pt was then given Atarax to help calm him down which wasn't effective at all. Pt continued to attempt to climb out of bed and needed intervention about every 30 min. Discussed this with the doctors this morning when they were rounding. ?

## 2022-03-17 NOTE — Progress Notes (Signed)
?  Subjective:  ?Patient ID: Stephen Carroll, male    DOB: 06-Feb-1966,  MRN: [id number] ? ?Patient still somewhat altered, he keeps asking me about "where's angela". Surgery canceled today due to AMS.  ? ?Negative for chest pain and shortness of breath ?Review of all other systems is negative ?Objective:  ? ?Vitals:  ? 03/17/22 0759 03/17/22 1147  ?BP: (!) 158/92 (!) 172/98  ?Pulse: 81 87  ?Resp: 18 18  ?Temp: 98 ?F (36.7 ?C) 97.7 ?F (36.5 ?C)  ?SpO2:    ? ?General Confused, oriented to person and place.  ?Vascular Dorsalis pedis and posterior tibial pulses 2/4 bilat. ?Brisk capillary refill to all digits. Pedal hair present.  ?Neurologic Epicritic sensation grossly intact.  ?Dermatologic Necrotic left 2nd toe  ?Orthopedic: MMT 5/5 in dorsiflexion, plantarflexion, inversion, and eversion. ?Normal joint ROM without pain or crepitus.  ? ? ?Assessment & Plan:  ?Patient was evaluated and treated and all questions answered. ? ?Osteomyelitis of 2nd toe ?-OR canceled today due to AMS ?-Continue broad spectrum ABX ?-Will plan to take to OR Sunday 4/9. NPO past midnight  ? ?Criselda Peaches, DPM ? ?Accessible via secure chat for questions or concerns. ? ?

## 2022-03-17 NOTE — Progress Notes (Signed)
PT Cancellation Note ? ?Patient Details ?Name: Stephen Carroll ?MRN: [id number] ?DOB: July 31, 1966 ? ? ?Cancelled Treatment:    Reason Eval/Treat Not Completed: Other (comment). Pt did not receive amputation yesterday, but surgery now scheduled for early afternoon today. Will plan to follow-up after surgery if PT still here at that time. ? ? ?Moishe Spice, PT, DPT ?Acute Rehabilitation Services  ?Pager: [phone] ?Office: [phone] ? ? ? ?Maretta Bees Pettis ?03/17/2022, 9:11 AM ? ? ?

## 2022-03-18 DIAGNOSIS — M869 Osteomyelitis, unspecified: Secondary | ICD-10-CM | POA: Diagnosis not present

## 2022-03-18 DIAGNOSIS — F10931 Alcohol use, unspecified with withdrawal delirium: Secondary | ICD-10-CM | POA: Diagnosis not present

## 2022-03-18 LAB — BASIC METABOLIC PANEL
Anion gap: 7 (ref 5–15)
BUN: <5 mg/dL — ABNORMAL LOW (ref 6–20)
CO2: 20 mmol/L — ABNORMAL LOW (ref 22–32)
Calcium: 8.5 mg/dL — ABNORMAL LOW (ref 8.9–10.3)
Chloride: 103 mmol/L (ref 98–111)
Creatinine, Ser: 0.63 mg/dL (ref 0.61–1.24)
GFR, Estimated: >60 mL/min (ref >60)
Glucose, Bld: 126 mg/dL — ABNORMAL HIGH (ref 70–99)
Potassium: 3 mmol/L — ABNORMAL LOW (ref 3.5–5.1)
Sodium: 130 mmol/L — ABNORMAL LOW (ref 135–145)

## 2022-03-18 LAB — VITAMIN B12: Vitamin B-12: 722 pg/mL (ref 180–914)

## 2022-03-18 LAB — CBC
HCT: 29.8 % — ABNORMAL LOW (ref 39.0–52.0)
Hemoglobin: 10.2 g/dL — ABNORMAL LOW (ref 13.0–17.0)
MCH: 28.7 pg (ref 26.0–34.0)
MCHC: 34.2 g/dL (ref 30.0–36.0)
MCV: 83.9 fL (ref 80.0–100.0)
Platelets: 153 10*3/uL (ref 150–400)
RBC: 3.55 MIL/uL — ABNORMAL LOW (ref 4.22–5.81)
RDW: 14.7 % (ref 11.5–15.5)
WBC: 8.7 10*3/uL (ref 4.0–10.5)
nRBC: 0 % (ref 0.0–0.2)

## 2022-03-18 LAB — FERRITIN: Ferritin: 153 ng/mL (ref 24–336)

## 2022-03-18 MED ORDER — POTASSIUM CHLORIDE 20 MEQ PO PACK
40.0000 meq | PACK | ORAL | Status: AC
  Administered 2022-03-18 (×2): 40 meq via ORAL
  Filled 2022-03-18 (×2): qty 2

## 2022-03-18 MED ORDER — HEPARIN SODIUM (PORCINE) 5000 UNIT/ML IJ SOLN
5000.0000 [IU] | Freq: Three times a day (TID) | INTRAMUSCULAR | Status: AC
  Administered 2022-03-18 (×2): 5000 [IU] via SUBCUTANEOUS
  Filled 2022-03-18 (×2): qty 1

## 2022-03-18 NOTE — Progress Notes (Signed)
FPTS Night Rounding Progress Note ? ?S:Went to bedside to check on patient, sitter in the room. Patient asleep, I did not wake the patient.  ? ?O: ?BP (!) 148/79 (BP Location: Right Arm)   Pulse 84   Temp 98.4 ?F (36.9 ?C) (Oral)   Resp 18   Wt 85.6 kg   SpO2 100%   BMI 28.69 kg/m?   ?General: Resting comfortably in bed, in no acute distress. ? ?A/P: ?Discussed continued plan with nurse and sitter. Patient on librium taper, has received 50 mg thus far since 7pm 4/7. Will continue to closely monitor CIWAs. Given patient's ongoing withdrawal symptoms from alcohol use, his surgery has been postponed. Hopeful for surgical intervention on 4/9. Remainder of plan per day team.  ? Donney Dice, DO ?03/18/2022, 12:52 AM ?PGY-2, [place] ?Service pager [phone]  ?

## 2022-03-18 NOTE — Progress Notes (Addendum)
Family Medicine Teaching Service ?Daily Progress Note ?Intern Pager: [phone] ? ?Patient name: Stephen Carroll Medical record number: [id number] ?Date of birth: 09-14-1966 Age: 56 y.o. Gender: male ? ?Primary Care Provider: Lyndee Hensen, DO ?Consultants: Podiatry ?Code Status: Full ? ?Pt Overview and Major Events to Date:  ?4/5 admit ?4/6 librium taper started ?4/9 L 2nd toe amputation scheduled ? ?Assessment and Plan: ?Stephen Carroll is a 56 y.o. male presenting with left second toe osteomyelitis, septic arthritis, and severe alcohol withdrawal. PMH is significant for EtOH abuse, cerebral aneurysm alcohol withdrawal seizures and alcoholic cirrhosis with ascites. ? ?Left second toe osteomyelitis and septic arthritis ?VSS on room air ?-Podiatry following, appreciate recs ?-Planned toe amputation 4/9, if CIWA stable ?-NPO at midnight ?-D/c heparin subq at midnight for surgery ?-Blood culture no growth 2 days ?-Doxycycline 100 mg bid  ?-mIVF, d/c after surgery ? ?ETOH abuse  Alcoholic liver cirrhosis, esophageal varices  Alcohol withdrawal seizures ?Still confused but doing better. Rapid called last night due to CIWA > 20. Downtrending to 7 this AM. Appears to be worse at night. Repeat UDS same as admission.  ?-CIWA monitoring ?-Continue librium taper ?-1:1 sitter ?-Continue propranolol 20 mg bid ?-Holding aldactone and lasix ?-Decrease mIVF ?-Daily weight ?-Multivitamin ?-TOC consult ?-Ethanol abstinence ?-Outpatient GI follow up ? ?Hypokalemia ?K 3.1>3.0. Will continue to replete ?-Replete with klor con 40 meq x2  ?-AM BMP ? ?Chronic hyponatremia ?Na improving with mIVF ?-AM BMP ?  ?Normocytic Anemia, stable ?-continue to monitor ? ?FEN/GI: regular ?PPx: subcutaneous heparin ?Dispo:Pending PT recommendations  in 2-3 days. Barriers include alcohol withdrawal and surgery.  ? ?Subjective:  ?Patient seen and assessed at bedside.  Surveyor, quantity in room.  Patient denies any complaints.  Does not think he is in  withdrawal.  Mentation appears to be improving.  Was more alert and oriented today. ? ?Objective: ?Temp:  [97.7 ?F (36.5 ?C)-98.4 ?F (36.9 ?C)] 98.1 ?F (36.7 ?C) (04/08 0300) ?Pulse Rate:  [82-98] 82 (04/08 0300) ?Resp:  [18-19] 18 (04/08 0300) ?BP: (116-172)/(77-98) 128/81 (04/08 0300) ?Physical Exam: ?General: Resting comfortably in bed.  NAD ?Cardiovascular: RRR ?Respiratory: CTA B ?Abdomen: Soft, nontender to palpation ?Extremities: Warm, dry ? ?Laboratory: ?Recent Labs  ?Lab 03/14/22 ?2153 03/16/22 ?0336 03/17/22 ?0434  ?WBC 7.1 9.9 10.9*  ?HGB 11.5* 12.3* 11.8*  ?HCT 33.6* 35.1* 34.3*  ?PLT 158 161 156  ? ?Recent Labs  ?Lab 03/14/22 ?2153 03/16/22 ?0336 03/17/22 ?0434  ?NA 130* 128* 131*  ?K 3.7 3.7 3.1*  ?CL 94* 95* 99  ?CO2 '25 23 23  '$ ?BUN <5* <5* <5*  ?CREATININE 0.73 0.76 0.58*  ?CALCIUM 9.1 9.0 8.9  ?PROT 9.9* 9.2* 8.2*  ?BILITOT 0.8 1.7* 1.8*  ?ALKPHOS 70 78 65  ?ALT '29 28 28  '$ ?AST 55* 53* 45*  ?GLUCOSE 88 130* 107*  ? ? ? ? ?Imaging/Diagnostic Tests: ?No results found. ? ?France Ravens, MD ?03/18/2022, 8:39 AM ?PGY-1, [place] ?[facility] Intern pager: [phone], text pages welcome ?
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
## 2022-03-18 NOTE — Significant Event (Signed)
Rapid Response Event Note  ? ?Reason for Call :  ?Asked by FPTS to see pt as a second set of eyes for withdrawal.  ? ?Pt is currently on Librium taper.  ? ?Initial Focused Assessment:  ?Pt lying in bed with eyes open, in no visible distress. He is alert to self and thinks he is in a hotel room. He is mildly anxious but cooperative at this time.  ? ?CIWA-8. ? ?Interventions:  ?No RRT interventions needed at this time ? ?Plan of Care:  ?Continue to monitor pt closely. Call RRT if further assistance needed.  ? ?Event Summary:  ? ?MD Notified: FPTS saw pt PTA RRT ?Call Ar[facility] ?Arrival Time:0131 ?End Time: 0137 ? ?Dillard Essex, RN ?

## 2022-03-18 NOTE — Progress Notes (Signed)
Family Medicine Teaching Service ?Daily Progress Note ?Intern Pager: [phone] ? ?Patient name: Stephen Carroll Medical record number: [id number] ?Date of birth: 08-Nov-1966 Age: 56 y.o. Gender: male ? ?Primary Care Provider: Lyndee Hensen, DO ?Consultants: Podiatry ?Code Status: Full ? ?Pt Overview and Major Events to Date:  ?4/5 admit ?4/6 librium taper started ?4/9 L 2nd toe amputation scheduled ? ?Assessment and Plan: ?ZARIAN COLPITTS is a 56 y.o. male presenting with left second toe osteomyelitis, septic arthritis, and severe alcohol withdrawal. PMH is significant for EtOH abuse, cerebral aneurysm alcohol withdrawal seizures and alcoholic cirrhosis with ascites. ? ?Left second toe osteomyelitis and septic arthritis ?Plan for amputation with podiatry today 4/9. ?-Podiatry consulted, appreciate their assistance in caring for this patient ?- Toe amputation planned for today ?- N.p.o. at midnight ?- Heparin held at midnight for surgery ?- Continue to monitor blood cultures ?- Continue doxycycline 100 mg twice daily ?- Continue IV fluids at current rate and plan to DC after surgery ? ?ETOH abuse  Alcoholic liver cirrhosis, esophageal varices  Alcohol withdrawal seizures ?CIWA's overnight 1>0.  Did not require any Ativan overnight. ?- Continue CIWA monitoring ?- Continue Librium taper ?- One-to-one sitter ?- Multivitamin ?- To see consulted ?- Continue to recommend ethanol abstinence ?- Outpatient GI follow-up ? ?Hypokalemia ?K this morning of 3.7 ?- Continue to replete K as needed ?- Morning BMPs ? ?Chronic hyponatremia ?NA this morning of 132, asymptomatic. ?- Continue to monitor morning BMPs ? ?  ?Normocytic Anemia, stable ?-Continue to monitor ? ?FEN/GI: N.p.o. until after surgery ?PPx: SCDs ?Dispo:Pending PT recommendations  in 2-3 days. Barriers include alcohol withdrawal and surgery.  ? ?Subjective:  ?Patient is doing well this morning although frustrated because he reports that he wants to go home.  He  reports that his birthday and his family had planned a birthday party for him that he is missing because he is here.  Discussed the importance of having the procedure and patient reports that he will have the procedure today but would like to leave this afternoon.  We discussed that we will have to evaluate after the procedure and he is okay with that although still adamant that he wants to leave today. ? ?Objective: ?Temp:  [97.6 ?F (36.4 ?C)-98.6 ?F (37 ?C)] 97.6 ?F (36.4 ?C) (04/08 2348) ?Pulse Rate:  [72-84] 72 (04/08 2348) ?Resp:  [17-18] 17 (04/08 2348) ?BP: (106-140)/(66-88) 106/66 (04/08 2348) ?SpO2:  [100 %] 100 % (04/08 2348) ?Weight:  [65.7 kg-66.7 kg] 66.7 kg (04/09 0500) ?Physical Exam: ?General: 56 year old male, sitting up in bed ?Cardiovascular: Regular rate and rhythm, no murmurs appreciated ?Respiratory: Clear to auscultation bilaterally ?Abdomen: Soft, nontender ?Extremities: Warm, dry ? ?Laboratory: ?Recent Labs  ?Lab 03/17/22 ?0434 03/18/22 ?1010 03/19/22 ?0228  ?WBC 10.9* 8.7 7.7  ?HGB 11.8* 10.2* 10.4*  ?HCT 34.3* 29.8* 31.0*  ?PLT 156 153 160  ? ?Recent Labs  ?Lab 03/14/22 ?2153 03/16/22 ?0336 03/17/22 ?0434 03/18/22 ?1010 03/19/22 ?0228  ?NA 130* 128* 131* 130* 132*  ?K 3.7 3.7 3.1* 3.0* 3.7  ?CL 94* 95* 99 103 106  ?CO2 '25 23 23 '$ 20* 22  ?BUN <5* <5* <5* <5* <5*  ?CREATININE 0.73 0.76 0.58* 0.63 0.65  ?CALCIUM 9.1 9.0 8.9 8.5* 8.6*  ?PROT 9.9* 9.2* 8.2*  --   --   ?BILITOT 0.8 1.7* 1.8*  --   --   ?ALKPHOS 70 78 65  --   --   ?ALT '29 28 28  '$ --   --   ?  AST 55* 53* 45*  --   --   ?GLUCOSE 88 130* 107* 126* 104*  ? ? ? ? ?Imaging/Diagnostic Tests: ?No results found. ? ?Gifford Shave, MD ?03/19/2022, 6:19 AM ?PGY-3, [place] ?[place] Intern pager: [phone], text pages welcome ?

## 2022-03-19 ENCOUNTER — Encounter (HOSPITAL_COMMUNITY): Payer: Self-pay | Admitting: Family Medicine

## 2022-03-19 ENCOUNTER — Other Ambulatory Visit: Payer: Self-pay

## 2022-03-19 ENCOUNTER — Inpatient Hospital Stay (HOSPITAL_COMMUNITY): Payer: Medicaid Other | Admitting: Certified Registered Nurse Anesthetist

## 2022-03-19 ENCOUNTER — Encounter (HOSPITAL_COMMUNITY)
Admission: EM | Disposition: A | Discharge status: Home / Self Care | Payer: Self-pay | Source: Home / Self Care | Attending: Family Medicine

## 2022-03-19 DIAGNOSIS — M1990 Unspecified osteoarthritis, unspecified site: Secondary | ICD-10-CM

## 2022-03-19 DIAGNOSIS — E871 Hypo-osmolality and hyponatremia: Secondary | ICD-10-CM | POA: Diagnosis not present

## 2022-03-19 DIAGNOSIS — M869 Osteomyelitis, unspecified: Secondary | ICD-10-CM

## 2022-03-19 DIAGNOSIS — L03032 Cellulitis of left toe: Secondary | ICD-10-CM | POA: Diagnosis not present

## 2022-03-19 DIAGNOSIS — D649 Anemia, unspecified: Secondary | ICD-10-CM

## 2022-03-19 DIAGNOSIS — K7460 Unspecified cirrhosis of liver: Secondary | ICD-10-CM

## 2022-03-19 DIAGNOSIS — F10931 Alcohol use, unspecified with withdrawal delirium: Secondary | ICD-10-CM | POA: Diagnosis not present

## 2022-03-19 HISTORY — PX: AMPUTATION: SHX166

## 2022-03-19 LAB — BASIC METABOLIC PANEL WITH GFR
Anion gap: 4 — ABNORMAL LOW (ref 5–15)
BUN: <5 mg/dL — ABNORMAL LOW (ref 6–20)
CO2: 22 mmol/L (ref 22–32)
Calcium: 8.6 mg/dL — ABNORMAL LOW (ref 8.9–10.3)
Chloride: 106 mmol/L (ref 98–111)
Creatinine, Ser: 0.65 mg/dL (ref 0.61–1.24)
GFR, Estimated: >60 mL/min
Glucose, Bld: 104 mg/dL — ABNORMAL HIGH (ref 70–99)
Potassium: 3.7 mmol/L (ref 3.5–5.1)
Sodium: 132 mmol/L — ABNORMAL LOW (ref 135–145)

## 2022-03-19 LAB — CBC
HCT: 31 % — ABNORMAL LOW (ref 39.0–52.0)
Hemoglobin: 10.4 g/dL — ABNORMAL LOW (ref 13.0–17.0)
MCH: 28.5 pg (ref 26.0–34.0)
MCHC: 33.5 g/dL (ref 30.0–36.0)
MCV: 84.9 fL (ref 80.0–100.0)
Platelets: 160 10*3/uL (ref 150–400)
RBC: 3.65 MIL/uL — ABNORMAL LOW (ref 4.22–5.81)
RDW: 14.9 % (ref 11.5–15.5)
WBC: 7.7 10*3/uL (ref 4.0–10.5)
nRBC: 0 % (ref 0.0–0.2)

## 2022-03-19 LAB — MAGNESIUM: Magnesium: 1.7 mg/dL (ref 1.7–2.4)

## 2022-03-19 SURGERY — AMPUTATION DIGIT
Anesthesia: Monitor Anesthesia Care | Laterality: Left

## 2022-03-19 MED ORDER — MIDAZOLAM HCL 2 MG/2ML IJ SOLN
INTRAMUSCULAR | Status: AC
  Filled 2022-03-19: qty 2

## 2022-03-19 MED ORDER — MIDAZOLAM HCL 2 MG/2ML IJ SOLN
INTRAMUSCULAR | Status: DC | PRN
  Administered 2022-03-19: 2 mg via INTRAVENOUS

## 2022-03-19 MED ORDER — LACTATED RINGERS IV SOLN: INTRAVENOUS | Status: DC | PRN

## 2022-03-19 MED ORDER — PROPOFOL 500 MG/50ML IV EMUL
INTRAVENOUS | Status: DC | PRN
  Administered 2022-03-19: 200 ug/kg/min via INTRAVENOUS

## 2022-03-19 MED ORDER — CHLORHEXIDINE GLUCONATE 0.12 % MT SOLN
15.0000 mL | Freq: Once | OROMUCOSAL | Status: AC
  Administered 2022-03-19: 15 mL via OROMUCOSAL

## 2022-03-19 MED ORDER — ACETAMINOPHEN 500 MG PO TABS: 1000.0000 mg | ORAL_TABLET | Freq: Once | ORAL | Status: DC | PRN

## 2022-03-19 MED ORDER — ADULT MULTIVITAMIN W/MINERALS CH: 1.0000 | ORAL_TABLET | Freq: Every day | ORAL | Status: DC

## 2022-03-19 MED ORDER — LIDOCAINE HCL 1 % IJ SOLN
INTRAMUSCULAR | Status: DC | PRN
  Administered 2022-03-19: 100 mg via INTRADERMAL

## 2022-03-19 MED ORDER — FENTANYL CITRATE (PF) 100 MCG/2ML IJ SOLN: 25.0000 ug | INTRAMUSCULAR | Status: DC | PRN

## 2022-03-19 MED ORDER — THIAMINE HCL 100 MG PO TABS: 100.0000 mg | ORAL_TABLET | Freq: Every day | ORAL | Status: DC

## 2022-03-19 MED ORDER — ACETAMINOPHEN 160 MG/5ML PO SOLN: 1000.0000 mg | Freq: Once | ORAL | Status: DC | PRN

## 2022-03-19 MED ORDER — BUPIVACAINE HCL (PF) 0.25 % IJ SOLN
INTRAMUSCULAR | Status: AC
  Filled 2022-03-19: qty 30

## 2022-03-19 MED ORDER — GLYCOPYRROLATE 0.2 MG/ML IJ SOLN
INTRAMUSCULAR | Status: DC | PRN
  Administered 2022-03-19: .2 mg via INTRAVENOUS

## 2022-03-19 MED ORDER — LACTATED RINGERS IV SOLN
INTRAVENOUS | Status: DC
Start: 2022-03-19 — End: 2022-03-19

## 2022-03-19 MED ORDER — FOLIC ACID 1 MG PO TABS: 1.0000 mg | ORAL_TABLET | Freq: Every day | ORAL | Status: DC

## 2022-03-19 MED ORDER — DOXYCYCLINE HYCLATE 100 MG PO TABS
100.0000 mg | ORAL_TABLET | Freq: Two times a day (BID) | ORAL | 0 refills | Status: AC
Start: 2022-03-19 — End: 2022-04-02

## 2022-03-19 MED ORDER — BUPIVACAINE HCL (PF) 0.25 % IJ SOLN
INTRAMUSCULAR | Status: DC | PRN
  Administered 2022-03-19: 10 mL

## 2022-03-19 MED ORDER — ACETAMINOPHEN 10 MG/ML IV SOLN: 1000.0000 mg | Freq: Once | INTRAVENOUS | Status: DC | PRN

## 2022-03-19 SURGICAL SUPPLY — 25 items
BNDG ELASTIC 4X5.8 VLCR STR LF (GAUZE/BANDAGES/DRESSINGS) ×1 IMPLANT
BNDG GAUZE ELAST 4 BULKY (GAUZE/BANDAGES/DRESSINGS) ×1 IMPLANT
COVER SURGICAL LIGHT HANDLE (MISCELLANEOUS) ×2 IMPLANT
DRAPE SURG 17X23 STRL (DRAPES) ×2 IMPLANT
DRSG XEROFORM 1X8 (GAUZE/BANDAGES/DRESSINGS) ×1 IMPLANT
GAUZE SPONGE 4X4 12PLY STRL (GAUZE/BANDAGES/DRESSINGS) ×1 IMPLANT
GAUZE SPONGE 4X4 12PLY STRL LF (GAUZE/BANDAGES/DRESSINGS) ×1 IMPLANT
GAUZE XEROFORM 1X8 LF (GAUZE/BANDAGES/DRESSINGS) ×1 IMPLANT
GLOVE SURG ENC TEXT LTX SZ7 (GLOVE) ×2 IMPLANT
GLOVE SURG POLY ORTHO LF SZ7.5 (GLOVE) ×2 IMPLANT
GOWN STRL REUS W/ TWL LRG LVL3 (GOWN DISPOSABLE) ×2 IMPLANT
GOWN STRL REUS W/TWL LRG LVL3 (GOWN DISPOSABLE) ×4
KIT BASIN OR (CUSTOM PROCEDURE TRAY) ×2 IMPLANT
KIT TURNOVER KIT B (KITS) ×2 IMPLANT
NDL HYPO 25GX1X1/2 BEV (NEEDLE) IMPLANT
NEEDLE HYPO 25GX1X1/2 BEV (NEEDLE) ×2 IMPLANT
NS IRRIG 1000ML POUR BTL (IV SOLUTION) ×1 IMPLANT
PACK ORTHO EXTREMITY (CUSTOM PROCEDURE TRAY) ×2 IMPLANT
SOL PREP POV-IOD 4OZ 10% (MISCELLANEOUS) ×6 IMPLANT
SPECIMEN JAR SMALL (MISCELLANEOUS) ×3 IMPLANT
SUT ETHILON 3 0 PS 1 (SUTURE) ×3 IMPLANT
SYR CONTROL 10ML LL (SYRINGE) ×1 IMPLANT
TOWEL GREEN STERILE FF (TOWEL DISPOSABLE) ×2 IMPLANT
TUBE CONNECTING 12X1/4 (SUCTIONS) ×1 IMPLANT
UNDERPAD 30X36 HEAVY ABSORB (UNDERPADS AND DIAPERS) ×2 IMPLANT

## 2022-03-19 NOTE — H&P (Signed)
History and Physical Interval Note: ? ?03/19/2022 ?7:40 AM ? ?Stephen Carroll  has presented today for surgery, with the diagnosis of osteomyelitis.  The various methods of treatment have been discussed with the patient and family. After consideration of risks, benefits and other options for treatment, the patient has consented to   ?Procedure(s): ?SECOND TOE AMPUTATION (Left) as a surgical intervention.  The patient's history has been reviewed, patient examined, no change in status, stable for surgery.  I have reviewed the patient's chart and labs.  Questions were answered to the patient's satisfaction.   ? ? ?[name] ? ? ?

## 2022-03-19 NOTE — Anesthesia Postprocedure Evaluation (Signed)
Anesthesia Post Note ? ?Patient: Percy Winterrowd Lafferty ? ?Procedure(s) Performed: SECOND TOE AMPUTATION (Left) ? ?  ? ?Patient location during evaluation: PACU ?Anesthesia Type: MAC ?Level of consciousness: awake and alert ?Pain management: pain level controlled ?Vital Signs Assessment: post-procedure vital signs reviewed and stable ?Respiratory status: spontaneous breathing, nonlabored ventilation, respiratory function stable and patient connected to nasal cannula oxygen ?Cardiovascular status: stable and blood pressure returned to baseline ?Postop Assessment: no apparent nausea or vomiting ?Anesthetic complications: no ? ? ?No notable events documented. ? ?Last Vitals:  ?Vitals:  ? 03/19/22 0900 03/19/22 0918  ?BP: (!) 166/87 (!) 168/87  ?Pulse: 75 69  ?Resp: 15 13  ?Temp: 36.7 ?C 36.6 ?C  ?SpO2: 100% 100%  ?  ?Last Pain:  ?Vitals:  ? 03/19/22 0918  ?TempSrc: Oral  ?PainSc:   ? ? ?  ?  ?  ?  ?  ?  ? ?Michaelene Dutan ? ? ? ? ?

## 2022-03-19 NOTE — Op Note (Signed)
Patient Name: Stephen Carroll ?DOB: [birth year]  ?MRN: [id number] ?  ?Date of Service: 03/14/2022 - 03/19/2022 ? ?Surgeon: Dr. Lanae Crumbly, DPM ?Assistants: None ?Pre-operative Diagnosis:  ?Osteomyelitis left second toe ?Post-operative Diagnosis:  ?Osteomyelitis left second toe ?Procedures: ? 1) partial amputation left second toe ?Pathology/Specimens: ?ID Type Source Tests Collected by Time Destination  ?1 : LEFT SECOND TOE  Amputation Toe, Left SURGICAL PATHOLOGY Criselda Peaches, DPM 03/19/2022 0051   ?A : LEFT SECOND TOE BIOPSY FOR CULTURE Tissue Bone AEROBIC/ANAEROBIC CULTURE W GRAM STAIN (SURGICAL/DEEP WOUND), ANAEROBIC CULTURE Lupita Raider, DPM 03/19/2022 [phone]   ? ?Anesthesia: MAC ?Hemostasis: * No tourniquets in log * ?Estimated Blood Loss: 3 mL ?Materials: * No implants in log * ?Medications: 10 cc 0.25% Marcaine plain ?Complications: None ? ?Indications for Procedure:  ?This is a 56 y.o. male with a history of ulceration and hammertoe deformity of the left second toe he developed osteomyelitis as noted on preoperative MRI.  I recommended partial toe amputation.  Informed consent was signed and reviewed prior to surgery all questions were addressed. ?  ?Procedure in Detail: ?Patient was identified in pre-operative holding area. Formal consent was signed and the left lower extremity was marked. Patient was brought back to the operating room. Anesthesia was induced. The extremity was prepped and draped in the usual sterile fashion. Timeout was taken to confirm patient name, laterality, and procedure prior to incision.  ? ?Attention was then directed to the left second toe where an incision was made in a racquet style incision. Dissection was carried down to level of bone.  Dissection was continued to the base of the proximal phalanx and a bone cutter was used to resect the remainder of the toe from the base of the proximal phalanx.  The remaining bone at this level appeared healthy and viable.  No  purulence was encountered here.  The toe was sent for pathology and a bone culture was taken from this overlying the ulcer.  The area was copiously irrigated.  The skin was reapproximated with nylon  ? ? ?The foot was then dressed with Xeroform dry sterile dressings and an Ace wrap under light compression. Patient tolerated the procedure well. ?  ?Disposition: ?Following a period of post-operative monitoring, patient will be transferred to the floor. ? ?

## 2022-03-19 NOTE — Discharge Summary (Signed)
Family Medicine Teaching Service ?Hospital Discharge Summary ? ?Patient name: Stephen Carroll Medical record number: [id number] ?Date of birth: 1966/01/16 Age: 56 y.o. Gender: male ?Date of Admission: 03/14/2022  Date of Discharge: 4/9 ?Admitting Physician: Dickie La, MD ? ?Primary Care Provider: Lyndee Hensen, DO ?Consultants: Podiatry ? ?Indication for Hospitalization: Osteomyelitis ? ?Discharge Diagnoses/Problem List:  ?Cellulitis ?Left second toe osteomyelitis s/p second toe amputation ?EtOH abuse ?Alcoholic liver cirrhosis ?Alcohol withdrawal ?Chronic hyponatremia ?Normocytic anemia ? ?Disposition: Home ? ?Discharge Condition: Stable, improved ? ?Discharge Exam: Blood pressure (!) 168/87, pulse 69, temperature 97.9 ?F (36.6 ?C), temperature source Oral, resp. rate 13, weight 66.7 kg, SpO2 100 %. ?General: Alert and oriented in no apparent distress, pleasant  ?Heart: Regular rate and rhythm with no murmurs appreciated ?Lungs: Normal WOB ?Extremities: No lower extremity edema, left foot wrapped in dressing and ACE bandaging without strike through, clean and dry  ? ? ?Brief Hospital Course:  ?Stephen Carroll is a 56 y.o. male who was admitted to the [facility] Medicine Teaching Service at [facility] for left second toe pain. Hospital course is outlined below by system. PMH is significant for EtOH abuse, cerebral aneurysm alcohol withdrawal seizures and alcoholic cirrhosis with ascites. ? 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 Left foot xray showed findings suspicious for osteomyelitis/septic arthritis involving the second distal interphalangeal joint. Podiatry was consulted and continued with left 2nd toe amputation on 4/9. Surgery went well without complications and patient was discharged with post op shoe, doxycycline x2 weeks awaiting cultures, and is to follow up with podiatry (podiatry to schedule).  ? ?ETOH abuse  Alcoholic  liver cirrhosis, esophageal varices  Alcohol withdrawal seizures ?During admission, patient placed on CIWA protocol and required Ativan for active hallucinations and aggressive behavior.  Patient was placed on Librium taper and also required a one-to-one sitter due to erratic behavior and wandering/attempting to leave.  Patient's withdrawal delayed surgical care.  Home medications of propanolol, Lasix, spironolactone were resumed after surgery. ? ?Hypokalemia ?Patient had low potassium levels for a few days which was repleted appropriately.  Potassium upon discharge was 3.7 ? 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 At baseline. This was monitored but remained stable during hospitalization. ? ?Normocytic Anemia  ?Appears to have anemia at baseline 11-12, likely ACD. Assess repeat CBC on follow up after surgery.  ? ?Issues for follow-up: ?Recommend ABI's and outpatient vascular follow-up ?Repeat BMP to assess potassium, CBC to assess anemia ?Continue to approach alcohol use disorder treatment options  ?F/u with podiatry (podiatry to call and schedule) ?Ensure completion of doxycycline   ?Continued home diuretics  ? ? ?Significant Procedures:  ?4/9- Amputation of left second toe ? ?Significant Labs and Imaging:  ?Recent Labs  ?Lab 03/17/22 ?0434 03/18/22 ?1010 03/19/22 ?0228  ?WBC 10.9* 8.7 7.7  ?HGB 11.8* 10.2* 10.4*  ?HCT 34.3* 29.8* 31.0*  ?PLT 156 153 160  ? ?Recent Labs  ?Lab 03/14/22 ?2153 03/16/22 ?0336 03/17/22 ?0434 03/18/22 ?1010 03/19/22 ?0228  ?NA 130* 128* 131* 130* 132*  ?K 3.7 3.7 3.1* 3.0* 3.7  ?CL 94* 95* 99 103 106  ?CO2 '25 23 23 '$ 20* 22  ?GLUCOSE 88 130* 107* 126* 104*  ?BUN <5* <5* <5* <5* <5*  ?CREATININE 0.73 0.76 0.58* 0.63 0.65  ?CALCIUM 9.1 9.0 8.9 8.5* 8.6*  ?MG  --   --   --   --  1.7  ?ALKPHOS 70 78 65  --   --   ?  AST 55* 53* 45*  --   --   ?ALT '29 28 28  '$ --   --   ?ALBUMIN 3.5 3.3* 2.9*  --   --   ? ? ?MRI Left foot without contrast ? ?Result Date: 03/15/2022 ?IMPRESSION:  1. Destructive changes of the distal and middle phalanx of the second toe centered at the distal interphalangeal joint compatible with acute osteomyelitis and septic arthritis. 2. Prominent soft tissue swelling along the dorsal aspect of the second toe centered at the DIP joint with presumed abscess, evaluation degraded by artifact at the edge of the field of view. 3. Suspect soft tissue wound or ulceration along the periphery of the fifth toe. No convincing MR evidence of osteomyelitis involving the fifth toe.  ? ?DG Foot Complete Left ? ?IMPRESSION: 1. Findings suspicious for osteomyelitis/septic arthritis involving the second distal interphalangeal joint. There are marked osseous erosions at this level and surrounding soft tissue swelling.  ? ? ?Results/Tests Pending at Time of Discharge: Wound cultures pending (podiatry to follow up and change abx as needed) ? ?Discharge Medications:  ?Allergies as of 03/19/2022   ?No Known Allergies ?  ? ?  ?Medication List  ?  ? ?TAKE these medications   ? ?ammonium lactate 12 % lotion ?Commonly known as: AmLactin ?Apply 1 application topically as needed for dry skin. ?What changed: when to take this ?  ?doxycycline 100 MG tablet ?Commonly known as: VIBRA-TABS ?Take 1 tablet (100 mg total) by mouth every 12 (twelve) hours for 14 days. ?  ?folic acid 1 MG tablet ?Commonly known as: FOLVITE ?Take 1 tablet (1 mg total) by mouth daily. ?Start taking on: March 20, 2022 ?  ?furosemide 40 MG tablet ?Commonly known as: LASIX ?Take 1 tablet (40 mg total) by mouth daily. Please keep your appointment on March 29th ?  ?multivitamin with minerals Tabs tablet ?Take 1 tablet by mouth daily. ?Start taking on: March 20, 2022 ?  ?ondansetron 4 MG tablet ?Commonly known as: ZOFRAN ?Take 1 tablet (4 mg total) by mouth every 8 (eight) hours as needed for nausea or vomiting. Please keep your appointment on March 27th for any further refills ?What changed: when to take this ?  ?propranolol 20 MG  tablet ?Commonly known as: INDERAL ?Take 1 tablet (20 mg total) by mouth 2 (two) times daily. ?What changed: when to take this ?  ?spironolactone 100 MG tablet ?Commonly known as: ALDACTONE ?Take 1 tablet (100 mg total) by mouth daily. Please keep your appointment on March 29th ?What changed: when to take this ?  ?thiamine 100 MG tablet ?Take 1 tablet (100 mg total) by mouth daily. ?Start taking on: March 20, 2022 ?  ? ?  ? ?  ?  ? ? ?  ?Discharge Care Instructions  ?(From admission, onward)  ?  ? ? ?  ? ?  Start     Ordered  ? 03/19/22 0000  Discharge wound care:       ?Comments: No dressing changes needed per podiatry  ? 03/19/22 1127  ? ?  ?  ? ?  ? ? ?Discharge Instructions: Please refer to Patient Instructions section of EMR for full details.  Patient was counseled important signs and symptoms that should prompt return to medical care, changes in medications, dietary instructions, activity restrictions, and follow up appointments.  ? ?Follow-Up Appointments: ? Follow-up Information   ? ? Brimage, Ronnette Juniper, DO. Go on 03/30/2022.   ?Specialty: Family Medicine ?Why: '@1'$ :50PM, please arrive 15 minutes  ahead of time. ?Contact information: ?[address] [place] [address] ?[phone] ? ? ?  ?  ? ? Gardiner Barefoot, DPM. Go on 04/21/2022.   ?Specialty: Podiatry ?Why: '@8'$ :15 AM ?Contact information: ?[address] ?[place] [address] ?[phone] ? ? ?  ?  ? ?  ?  ? ?  ? ?Podiatry to call for post-op follow up next week.  ? ?Erskine Emery, MD ?03/19/2022, 11:31 AM ?PGY-1, [place] ? ?

## 2022-03-19 NOTE — Progress Notes (Signed)
PT Cancellation Note ? ?Patient Details ?Name: Stephen Carroll ?MRN: [id number] ?DOB: [birth year] ? ? ?Cancelled Treatment:    Reason Eval/Treat Not Completed: Patient at procedure or test/unavailable. Pt in OR for L 2nd toe amp. ? ? ?Lorriane Shire ?03/19/2022, 7:50 AM ? ?Lorrin Goodell, PT  ?Office # [phone] ?Pager [phone] ? ? ?

## 2022-03-19 NOTE — Anesthesia Procedure Notes (Signed)
Procedure Name: Intubation ?Date/Time: 03/19/2022 7:48 AM ?Performed by: Claris Che, CRNA ?Pre-anesthesia Checklist: Patient identified, Emergency Drugs available, Suction available, Patient being monitored and Timeout performed ?Patient Re-evaluated:Patient Re-evaluated prior to induction ?Oxygen Delivery Method: Simple face mask ?Dental Injury: Teeth and Oropharynx as per pre-operative assessment  ? ? ? ? ?

## 2022-03-19 NOTE — Brief Op Note (Signed)
03/19/2022 ? ?8:23 AM ? ?PATIENT:  Stephen Carroll  56 y.o. male ? ?PRE-OPERATIVE DIAGNOSIS:  OSTEOMYELITIS OF LEFT FOOT ? ?POST-OPERATIVE DIAGNOSIS:  OSTEOMYELITIS OF LEFT FOOT ? ?PROCEDURE:  Procedure(s): ?SECOND TOE AMPUTATION (Left) ? ?SURGEON:  Surgeon(s) and Role: ?   * Kamri Gotsch, Stephan Minister, DPM - Primary ? ?ASSISTANTS: none  ? ?ANESTHESIA:   local and MAC ? ?EBL: Minimal ? ?BLOOD ADMINISTERED:none ? ?DRAINS: none  ? ?LOCAL MEDICATIONS USED:  MARCAINE    and Amount: 10 ml ? ?SPECIMEN: Right second toe ? ?DISPOSITION OF SPECIMEN: Bone culture microbiology and pathology ? ?COUNTS:  YES ? ?TOURNIQUET:  * No tourniquets in log * ? ?DICTATION: .Note written in EPIC ? ?PLAN OF CARE: Admit to inpatient  ? ?PATIENT DISPOSITION:  PACU - hemodynamically stable. ?  ?Delay start of Pharmacological VTE agent (>24hrs) due to surgical blood loss or risk of bleeding: yes ? ?

## 2022-03-19 NOTE — Transfer of Care (Signed)
Immediate Anesthesia Transfer of Care Note ? ?Patient: Stephen Carroll ? ?Procedure(s) Performed: SECOND TOE AMPUTATION (Left) ? ?Patient Location: PACU ? ?Anesthesia Type:MAC combined with regional for post-op pain ? ?Level of Consciousness: awake, alert , oriented and patient cooperative ? ?Airway & Oxygen Therapy: Patient Spontanous Breathing ? ?Post-op Assessment: Report given to RN, Post -op Vital signs reviewed and stable and Patient moving all extremities X 4 ? ?Post vital signs: Reviewed and stable ? ?Last Vitals:  ?Vitals Value Taken Time  ?BP 148/99 03/19/22 0828  ?Temp    ?Pulse 78 03/19/22 0832  ?Resp 15 03/19/22 0832  ?SpO2 100 % 03/19/22 0832  ?Vitals shown include unvalidated device data. ? ?Last Pain:  ?Vitals:  ? 03/19/22 0710  ?TempSrc: Oral  ?PainSc:   ?   ? ?  ? ?Complications: No notable events documented. ?

## 2022-03-19 NOTE — Plan of Care (Signed)

## 2022-03-19 NOTE — Discharge Instructions (Signed)
Dear Stephen Carroll,  ? ?Thank you so much for allowing [place] to be part of your care!  You were admitted to [facility] for pain in the left second toe. You had an infection in the bone, so we continued with surgery with podiatry that you tolerated well.  ? ?We also controlled alcohol withdrawal symptoms with medications ? ?Please take doxycycline for two total weeks after discharge. Eat food prior to taking the antibiotic.  ? ?POST-HOSPITAL & CARE INSTRUCTIONS ?Please complete course of Doxycycline  ?Podiatry will call you for a follow up after surgery  ?Please let PCP/Specialists know of any changes that were made.  ?Please see medications section of this packet for any medication changes.  ? ?DOCTOR'S APPOINTMENT & FOLLOW UP CARE INSTRUCTIONS  ?Future Appointments  ?Date Time Provider [place]  ?03/30/2022  1:50 PM Lyndee Hensen, DO [facility]  ?04/21/2022  8:15 AM Gardiner Barefoot, DPM [facility]  ? ? ?RETURN PRECAUTIONS: ? ?Please return for care if you experience SOB/chest pain/fever/chills/severe pain  ? ?Take care and be well! ? ?Family Medicine Teaching Service  ?[place]  ?[facility]  ?[address] [place], [place] [address] ?[phone] ? ?

## 2022-03-19 NOTE — Progress Notes (Signed)
Physical Therapy Treatment ?Patient Details ?Name: Stephen Carroll ?MRN: [id number] ?DOB: Aug 25, 1966 ?Today's Date: 03/19/2022 ? ? ?History of Present Illness Pt is a 56 y.o. male who presented 03/14/22 with L second toe osteomyelitis and septic arthritis.  He underwent partial amputation left second toe 4/9. PMH: EtOH abuse, cerebral aneurysm, alcohol withdrawal seizures and alcoholic cirrhosis with ascites ? ?  ?PT Comments  ? ? Pt received sitting EOB. No c/o L foot pain. He underwent partial amputation L second toe this AM. Surgical dressing and post-op shoe in place. He required min guard assist transfers and ambulation 100' without AD. Advised pt to don sneaker on R foot to improve stability/balance with gait/mobility. Pt verbalized understanding. Pt returned to EOB at end of session. Pt declining need for AD. ?   ?Recommendations for follow up therapy are one component of a multi-disciplinary discharge planning process, led by the attending physician.  Recommendations may be updated based on patient status, additional functional criteria and insurance authorization. ? ?Follow Up Recommendations ? Home health PT ?  ?  ?Assistance Recommended at Discharge    ?Patient can return home with the following A little help with walking and/or transfers;Assistance with cooking/housework;Direct supervision/assist for medications management;Direct supervision/assist for financial management;Assist for transportation;Help with stairs or ramp for entrance ?  ?Equipment Recommendations ? None recommended by PT  ?  ?Recommendations for Other Services   ? ? ?  ?Precautions / Restrictions Precautions ?Precautions: Fall ?Required Braces or Orthoses: Other Brace ?Other Brace: L post op shoe ?Restrictions ?LLE Weight Bearing: Weight bearing as tolerated  ?  ? ?Mobility ? Bed Mobility ?Overal bed mobility: Modified Independent ?  ?  ?  ?  ?  ?  ?  ?  ? ?Transfers ?Overall transfer level: Needs assistance ?Equipment used: None ?Transfers:  Sit to/from Stand ?Sit to Stand: Min guard ?  ?  ?  ?  ?  ?General transfer comment: min guard for safety, multi attempts to fully rise ?  ? ?Ambulation/Gait ?Ambulation/Gait assistance: Min guard ?Gait Distance (Feet): 100 Feet ?Assistive device: None ?Gait Pattern/deviations: Step-through pattern, Decreased stride length, Decreased weight shift to left ?Gait velocity: decreased ?Gait velocity interpretation: <1.8 ft/sec, indicate of risk for recurrent falls ?  ?General Gait Details: surgical dressing and post-op shoe LLE, gripper sock on right. Advised pt to don sneaker R foot for improved stability with gait. ? ? ?Stairs ?  ?  ?  ?  ?  ? ? ?Wheelchair Mobility ?  ? ?Modified Rankin (Stroke Patients Only) ?  ? ? ?  ?Balance Overall balance assessment: Mild deficits observed, not formally tested ?  ?  ?  ?  ?  ?  ?  ?  ?  ?  ?  ?  ?  ?  ?  ?  ?  ?  ?  ? ?  ?Cognition Arousal/Alertness: Awake/alert ?Behavior During Therapy: Flat affect ?Overall Cognitive Status: No family/caregiver present to determine baseline cognitive functioning ?Area of Impairment: Orientation, Attention, Memory, Following commands, Safety/judgement, Awareness, Problem solving ?  ?  ?  ?  ?  ?  ?  ?  ?Orientation Level:  (Able to state that yesterday was his birthday but confused on current time of day.) ?Current Attention Level: Sustained ?Memory: Decreased recall of precautions, Decreased short-term memory ?Following Commands: Follows one step commands consistently ?Safety/Judgement: Decreased awareness of deficits, Decreased awareness of safety ?Awareness: Emergent ?Problem Solving: Slow processing ?General Comments: Perseverating on need to call his sister.  Assisted with getting phone number from chart and copied on paper. Educated on use of room phone. Assisted with initial call but pt's sister didn't answer. ?  ?  ? ?  ?Exercises   ? ?  ?General Comments General comments (skin integrity, edema, etc.): VSS on RA ?  ?  ? ?Pertinent  Vitals/Pain Pain Assessment ?Pain Assessment: No/denies pain  ? ? ?Home Living   ?  ?  ?  ?  ?  ?  ?  ?  ?  ?   ?  ?Prior Function    ?  ?  ?   ? ?PT Goals (current goals can now be found in the care plan section) Acute Rehab PT Goals ?Patient Stated Goal: home ?Progress towards PT goals: Progressing toward goals ? ?  ?Frequency ? ? ? Min 3X/week ? ? ? ?  ?PT Plan Current plan remains appropriate  ? ? ?Co-evaluation   ?  ?  ?  ?  ? ?  ?AM-PAC PT "6 Clicks" Mobility   ?Outcome Measure ? Help needed turning from your back to your side while in a flat bed without using bedrails?: None ?Help needed moving from lying on your back to sitting on the side of a flat bed without using bedrails?: None ?Help needed moving to and from a bed to a chair (including a wheelchair)?: A Little ?Help needed standing up from a chair using your arms (e.g., wheelchair or bedside chair)?: A Little ?Help needed to walk in hospital room?: A Little ?Help needed climbing 3-5 steps with a railing? : A Little ?6 Click Score: 20 ? ?  ?End of Session Equipment Utilized During Treatment: Gait belt ?Activity Tolerance: Patient tolerated treatment well ?Patient left: with call bell/phone within reach;in bed ?Nurse Communication: Mobility status ?PT Visit Diagnosis: Unsteadiness on feet (R26.81);Other abnormalities of gait and mobility (R26.89) ?  ? ? ?Time: 9741-6384 ?PT Time Calculation (min) (ACUTE ONLY): 15 min ? ?Charges:  $Gait Training: 8-22 mins          ?          ? ?Stephen Carroll, PT  ?Office # [phone] ?Pager [phone] ? ? ? ?Stephen Carroll ?03/19/2022, 2:27 PM ? ?

## 2022-03-19 NOTE — Progress Notes (Signed)
Orthopedic Tech Progress Note ?Patient Details:  ?Stephen Carroll ?August 16, 1966 ?[id number] ? ?Ortho Devices ?Type of Ortho Device: Postop shoe/boot ?Ortho Device/Splint Location: RLE ?Ortho Device/Splint Interventions: Ordered, Application, Adjustment ?  ?Post Interventions ?Patient Tolerated: Well ?Instructions Provided: Adjustment of device ? ?Vernona Rieger ?03/19/2022, 9:05 AM ? ?

## 2022-03-19 NOTE — Anesthesia Preprocedure Evaluation (Addendum)
Anesthesia Evaluation  ?Patient identified by MRN, date of birth, ID band ?Patient awake ? ? ? ?Reviewed: ?Allergy & Precautions, NPO status , Patient's Chart, lab work & pertinent test results ? ?History of Anesthesia Complications ?Negative for: history of anesthetic complications ? ?Airway ?Mallampati: IV ? ?TM Distance: >3 FB ?Neck ROM: Full ? ?Mouth opening: Limited Mouth Opening ? Dental ? ?(+) Teeth Intact, Chipped,  ?  ?Pulmonary ?Patient abstained from smoking., former smoker,  ?  ?breath sounds clear to auscultation ? ? ? ? ? ? Cardiovascular ?negative cardio ROS ? ? ?Rhythm:Regular  ? ?  ?Neuro/Psych ? Headaches, Seizures -,    ? GI/Hepatic ?negative GI ROS, (+) Cirrhosis  ?  ?  ? , Lab Results ?     Component                Value               Date                 ?     ALT                      28                  03/17/2022           ?     AST                      45 (H)              03/17/2022           ?     ALKPHOS                  65                  03/17/2022           ?     BILITOT                  1.8 (H)             03/17/2022           ? ?  ?Endo/Other  ?negative endocrine ROS ? Renal/GU ?Lab Results ?     Component                Value               Date                 ?     CREATININE               0.65                03/19/2022           ?  ? ?  ?Musculoskeletal ? ?(+) Arthritis ,  ? Abdominal ?  ?Peds ? Hematology ? ?(+) Blood dyscrasia, anemia , Lab Results ?     Component                Value               Date                 ?     WBC  7.7                 03/19/2022           ?     HGB                      10.4 (L)            03/19/2022           ?     HCT                      31.0 (L)            03/19/2022           ?     MCV                      84.9                03/19/2022           ?     PLT                      160                 03/19/2022           ?   ?Anesthesia Other Findings ? ? Reproductive/Obstetrics ? ?   ? ? ? ? ? ? ? ? ? ? ? ? ? ?  ?  ? ? ? ? ? ? ? ?Anesthesia Physical ?Anesthesia Plan ? ?ASA: 3 ? ?Anesthesia Plan: MAC  ? ?Post-op Pain Management: Minimal or no pain anticipated and Ofirmev IV (intra-op)*  ? ?Induction:  ? ?PONV Risk Score and Plan: 1 and Propofol infusion and Treatment may vary due to age or medical condition ? ?Airway Management Planned: Nasal Cannula, Natural Airway and Simple Face Mask ? ?Additional Equipment: None ? ?Intra-op Plan:  ? ?Post-operative Plan:  ? ?Informed Consent: I have reviewed the patients History and Physical, chart, labs and discussed the procedure including the risks, benefits and alternatives for the proposed anesthesia with the patient or authorized representative who has indicated his/her understanding and acceptance.  ? ? ? ?Dental advisory given ? ?Plan Discussed with: CRNA ? ?Anesthesia Plan Comments:   ? ? ? ? ? ?Anesthesia Quick Evaluation ? ?

## 2022-03-19 NOTE — Progress Notes (Signed)
FPTS Interim Night Progress Note ? ?S:Patient sleeping comfortably.  Rounded with primary night RN.  No concerns voiced.  No orders required.   ? ?O: ?Today's Vitals  ? 03/18/22 1500 03/18/22 2000 03/18/22 2113 03/18/22 2348  ?BP:   140/74 106/66  ?Pulse:   78 72  ?Resp:   17 17  ?Temp:   97.8 ?F (36.6 ?C) 97.6 ?F (36.4 ?C)  ?TempSrc:   Oral Oral  ?SpO2:   100% 100%  ?Weight: 65.7 kg     ?PainSc:  0-No pain    ? ? ? ? ?A/P: ?Osteomyelitis; stable ?Continue current management  ? ?Carollee Leitz MD ?PGY-3, [facility] ?Service pager [phone]   ?

## 2022-03-20 ENCOUNTER — Telehealth: Payer: Self-pay

## 2022-03-20 LAB — CULTURE, BLOOD (SINGLE): Culture: NO GROWTH

## 2022-03-20 NOTE — Telephone Encounter (Signed)
Transition Care Management Follow-up Telephone Call ?Date of discharge and from where: 03/19/2022-[place]  ?How have you been since you were released from the hospital? Pt stated he is doing fine.  ?Any questions or concerns? No ? ?Items Reviewed: ?Did the pt receive and understand the discharge instructions provided? Yes  ?Medications obtained and verified? Yes  ?Other? No  ?Any new allergies since your discharge? No  ?Dietary orders reviewed? No ?Do you have support at home? Yes  ? ?Home Care and Equipment/Supplies: ?Were home health services ordered? not applicable ?If so, what is the name of the agency? N/A  ?Has the agency set up a time to come to the patient's home? not applicable ?Were any new equipment or medical supplies ordered?  No ?What is the name of the medical supply agency? N/A ?Were you able to get the supplies/equipment? not applicable ?Do you have any questions related to the use of the equipment or supplies? No ? ?Functional Questionnaire: (I = Independent and D = Dependent) ?ADLs: I ? ?Bathing/Dressing- I ? ?Meal Prep- I ? ?Eating- I ? ?Maintaining continence- I ? ?Transferring/Ambulation- I ? ?Managing Meds- I ? ?Follow up appointments reviewed: ? ?PCP Hospital f/u appt confirmed? Yes  Scheduled to see PCP on 4/20. ?[facility] f/u appt confirmed? Yes  Scheduled to see Podiatry on 04/21/2022. ?Are transportation arrangements needed? No  ?If their condition worsens, is the pt aware to call PCP or go to the Emergency Dept.? Yes ?Was the patient provided with contact information for the PCP's office or ED? Yes ?Was to pt encouraged to call back with questions or concerns? Yes  ?

## 2022-03-21 LAB — SURGICAL PATHOLOGY

## 2022-03-24 LAB — AEROBIC/ANAEROBIC CULTURE W GRAM STAIN (SURGICAL/DEEP WOUND): Culture: NO GROWTH

## 2022-03-28 ENCOUNTER — Ambulatory Visit (INDEPENDENT_AMBULATORY_CARE_PROVIDER_SITE_OTHER): Payer: Medicaid Other | Admitting: Podiatrist

## 2022-03-28 ENCOUNTER — Encounter: Payer: Self-pay | Admitting: Podiatrist

## 2022-03-28 ENCOUNTER — Ambulatory Visit (INDEPENDENT_AMBULATORY_CARE_PROVIDER_SITE_OTHER): Payer: Medicaid Other

## 2022-03-28 DIAGNOSIS — Z89422 Acquired absence of other left toe(s): Secondary | ICD-10-CM

## 2022-03-28 DIAGNOSIS — M2042 Other hammer toe(s) (acquired), left foot: Secondary | ICD-10-CM | POA: Diagnosis not present

## 2022-03-28 DIAGNOSIS — M2041 Other hammer toe(s) (acquired), right foot: Secondary | ICD-10-CM | POA: Diagnosis not present

## 2022-03-28 MED ORDER — AMMONIUM LACTATE 12 % EX CREA
1.0000 | TOPICAL_CREAM | CUTANEOUS | 0 refills | Status: DC | PRN
Start: 2022-03-28 — End: 2022-08-22

## 2022-03-28 NOTE — Progress Notes (Signed)
?  Subjective:  ?Patient ID: Stephen Carroll, male    DOB: 1966-05-26,  MRN: [id number] ? ?Chief Complaint  ?Patient presents with  ? Routine Post Op  ?   DOS 03/19/2022 POV #1 SECOND TOE AMPUTATION  ? ? ?DOS: 03/19/2022 ?Procedure: Amputation 2nd toe left ? ?56 y.o. male presents with the above complaint. History confirmed with patient.  Amputation of the left second toe was performed by Dr. Sherryle Lis on 03/19/2022.  The patient states overall he is doing well no complaints.  He denies any nausea vomiting fevers chills or night sweats denies any calf pain or tenderness.  Denies any pain to the left foot. ? ?Objective:  ?Physical Exam: Neuro ocular status is intact and unchanged.  Slight appearance of the postoperative foot is noted amputation site is healing well with no dehiscence.  Sutures are in place and appears to be healing well. ? ?Incision: healing well, no dehiscence, no significant erythema ? ?No images are attached to the encounter. ? ? ?Assessment:  ? ?1. Status post amputation of lesser toe of left foot ([place])   ? ? ?Plan:  ?Patient was evaluated and treated and all questions answered. ? ?Post-operative State ?-Ok to start showering at this time. Advised they cannot soak. ?-Dressing applied consisting of silvadene, sterile gauze, and ACE bandage ?Advised patient that sutures are not quite ready to come out yet and recommended he come back in 1 week to see Dr. Sherryle Lis for suture removal.  Also discussed that he can start wearing a normal shoe as he states that the postop shoe he has is very uncomfortable.  He will keep a bandage on the foot until he sees Dr. Sherryle Lis at the next visit. ? ?Return in about 1 week (around 04/04/2022), or for suture removel post second toe amputation.  ? ? ?

## 2022-03-30 ENCOUNTER — Inpatient Hospital Stay: Payer: Medicaid Other | Admitting: Student

## 2022-03-30 ENCOUNTER — Inpatient Hospital Stay: Payer: Medicaid Other | Admitting: Family Medicine

## 2022-04-06 ENCOUNTER — Ambulatory Visit (INDEPENDENT_AMBULATORY_CARE_PROVIDER_SITE_OTHER): Payer: Medicaid Other | Admitting: Podiatry

## 2022-04-06 DIAGNOSIS — Z89422 Acquired absence of other left toe(s): Secondary | ICD-10-CM | POA: Diagnosis not present

## 2022-04-06 NOTE — Progress Notes (Signed)
?  Subjective:  ?Patient ID: Stephen Carroll, male    DOB: 11-26-1966,  MRN: [id number] ? ?Chief Complaint  ?Patient presents with  ? Routine Post Op  ?   DOS 03/19/2022 POV #2 SECOND TOE AMPUTATION  ? ? ?56 y.o. male returns for post-op check.  Doing well is not having much pain ? ?Review of Systems: Negative except as noted in the HPI. Denies N/V/F/Ch. ? ? ?Objective:  ?There were no vitals filed for this visit. ?There is no height or weight on file to calculate BMI. ?Constitutional Well developed. ?Well nourished.  ?Vascular Foot warm and well perfused. ?Capillary refill normal to all digits.  Calf is soft and supple, no posterior calf or knee pain, negative Homans' sign  ?Neurologic Normal speech. ?Oriented to person, place, and time. ?Epicritic sensation to light touch grossly reduced bilaterally.  ?Dermatologic Skin healing well without signs of infection. Skin edges well coapted without signs of infection.  ?Orthopedic: He has no tenderness to palpation noted about the surgical site.  ? ?Assessment:  ? ?1. Status post amputation of lesser toe of left foot ([place])   ? ?Plan:  ?Patient was evaluated and treated and all questions answered. ? ?S/p foot surgery left ?-Doing well the sutures were removed uneventfully.  We discussed the possibility that the hallux may drift over and have worsening hallux valgus.  I dispensed a toe spacer for this.  I will see him back as needed. ? ?Return if symptoms worsen or fail to improve.  ?

## 2022-04-21 ENCOUNTER — Ambulatory Visit: Payer: Medicaid Other | Admitting: Podiatry

## 2022-04-21 ENCOUNTER — Encounter: Payer: Self-pay | Admitting: Podiatry

## 2022-04-21 DIAGNOSIS — Q828 Other specified congenital malformations of skin: Secondary | ICD-10-CM

## 2022-04-21 DIAGNOSIS — B353 Tinea pedis: Secondary | ICD-10-CM

## 2022-04-21 DIAGNOSIS — M79674 Pain in right toe(s): Secondary | ICD-10-CM

## 2022-04-21 DIAGNOSIS — M79675 Pain in left toe(s): Secondary | ICD-10-CM | POA: Diagnosis not present

## 2022-04-21 DIAGNOSIS — Z89422 Acquired absence of other left toe(s): Secondary | ICD-10-CM | POA: Diagnosis not present

## 2022-04-21 DIAGNOSIS — B351 Tinea unguium: Secondary | ICD-10-CM | POA: Diagnosis not present

## 2022-04-21 MED ORDER — CLOTRIMAZOLE 1 % EX CREA: TOPICAL_CREAM | CUTANEOUS | 1 refills | Status: DC

## 2022-04-21 NOTE — Patient Instructions (Signed)
Apply triple antibiotic ointment to right 5th toe once daily for one week. Call office if you have any problems. ? ?Athlete's Foot ?Athlete's foot (tinea pedis) is a fungal infection of the skin on your feet. It often occurs on the skin that is between or underneath the toes. It can also occur on the soles of your feet. The infection can spread from person to person (is contagious). It can also spread when a person's bare feet come in contact with the fungus on shower floors or on items such as shoes. ?What are the causes? ?This condition is caused by a fungus that grows in warm, moist places. You can get athlete's foot by sharing shoes, shower stalls, towels, and wet floors with someone who is infected. Not washing your feet or changing your socks often enough can also lead to athlete's foot. ?What increases the risk? ?This condition is more likely to develop in: ?Men. ?People who have a weak body defense system (immune system). ?People who have diabetes. ?People who use public showers, such as at a gym. ?People who wear heavy-duty shoes, such as Environmental manager. ?Seasons with warm, humid weather. ?What are the signs or symptoms? ?Symptoms of this condition include: ?Itchy areas between your toes or on the soles of your feet. ?White, flaky, or scaly areas between your toes or on the soles of your feet. ?Very itchy small blisters between your toes or on the soles of your feet. ?Small cuts in your skin. These cuts can become infected. ?Thick or discolored toenails. ?How is this diagnosed? ?This condition may be diagnosed with a physical exam and a review of your medical history. Your health care provider may also take a skin or toenail sample to examine under a microscope. ?How is this treated? ?This condition is treated with antifungal medicines. These may be applied as powders, ointments, or creams. In severe cases, an oral antifungal medicine may be given. ?Follow these instructions at  home: ?Medicines ?Apply or take over-the-counter and prescription medicines only as told by your health care provider. ?Apply your antifungal medicine as told by your health care provider. Do not stop using the antifungal even if your condition improves. ?Foot care ?Do not scratch your feet. ?Keep your feet dry: ?Wear cotton or wool socks. Change your socks every day or if they become wet. ?Wear shoes that allow air to flow, such as sandals or canvas tennis shoes. ?Wash and dry your feet, including the area between your toes. Also, wash and dry your feet: ?Every day or as told by your health care provider. ?After exercising. ?General instructions ?Do not let others use towels, shoes, nail clippers, or other personal items that touch your feet. ?Protect your feet by wearing sandals in wet areas, such as locker rooms and shared showers. ?Keep all follow-up visits. This is important. ?If you have diabetes, keep your blood sugar under control. ?Contact a health care provider if: ?You have a fever. ?You have swelling, soreness, warmth, or redness in your foot. ?Your feet are not getting better with treatment. ?Your symptoms get worse. ?You have new symptoms. ?You have severe pain. ?Summary ?Athlete's foot (tinea pedis) is a fungal infection of the skin on your feet. It often occurs on skin that is between or underneath the toes. ?This condition is caused by a fungus that grows in warm, moist places. ?Symptoms include white, flaky, or scaly areas between your toes or on the soles of your feet. ?This condition is treated with antifungal  medicines. ?Keep your feet clean. Always dry them thoroughly. ?This information is not intended to replace advice given to you by your health care provider. Make sure you discuss any questions you have with your health care provider. ?Document Revised: 03/20/2021 Document Reviewed: 03/20/2021 ?Elsevier Patient Education ? [place]. ? ?

## 2022-04-22 NOTE — Progress Notes (Signed)
?  Subjective:  ?Patient ID: Stephen Carroll, male    DOB: 1966/10/30,  MRN: [id number] ? ?Stephen Carroll presents to clinic today for at risk foot care. Patient has h/o amputation of digital amputation L 2nd toe and painful porokeratotic lesion(s) b/l feet and painful mycotic toenails that limit ambulation. Painful toenails interfere with ambulation. Aggravating factors include wearing enclosed shoe gear. Pain is relieved with periodic professional debridement. Painful porokeratotic lesions are aggravated when weightbearing with and without shoegear. Pain is relieved with periodic professional debridement. ? ?Patient states he had amputation after injuring his left 2nd toe which got infected. ? ?New problem(s): None.  ? ?PCP is Brimage, Ronnette Juniper, DO , and last visit was 3-4 months ago. ? ?No Known Allergies ? ?Review of Systems: Negative except as noted in the HPI. ? ?Objective: No changes noted in today's physical examination. ? ?Vascular Examination: ?Palpable pedal pulses b/l LE. Digital hair present b/l. No pedal edema b/l. Skin temperature gradient WNL b/l. No varicosities b/l. No pain with calf compression noted b/l. ? ?Dermatological Examination: ?Pedal skin with normal turgor, texture and tone b/l. No open wounds. No interdigital macerations b/l. Toenails 3-5, b/l, bilateral great toes and right 2nd digit thickened, discolored, dystrophic with subungual debris. There is pain on palpation to dorsal aspect of nailplates. Porokeratotic lesion(s) lateral aspect  b/l 5th digits, plantarlateral aspect of bilateral heels, plantar heel pad of left foot, and bilateral great toes. No erythema, no edema, no drainage, no fluctuance. Diffuse scaling noted peripherally and plantarly b/l feet.  No interdigital macerations.  No blisters, no weeping. No signs of secondary bacterial infection noted. Surgical site left 2nd digit stump completely healed. ? ?Neurological Examination: ?Protective sensation intact with 10 gram  monofilament b/l LE. Vibratory sensation intact b/l LE.  ? ?Musculoskeletal Examination: ?Muscle strength 5/5 to all LE muscle groups b/l. HAV with bunion deformity noted b/l LE. Hammertoe(s) noted to the 2-5 bilaterally. ? ? ?  Latest Ref Rng & Units 03/15/2022  ?  6:13 PM  ?Hemoglobin A1C  ?Hemoglobin-A1c 4.8 - 5.6 % 4.7    ? ?Assessment/Plan: ?1. Pain due to onychomycosis of toenails of both feet   ?2. Tinea pedis of both feet   ?3. Porokeratosis   ?4. Status post amputation of lesser toe of left foot ([place])   ?  ?-Patient was evaluated and treated. All patient's and/or POA's questions/concerns answered on today's visit. ?-Patient is s/p amputation left 2nd digit which has healed. ?-Medicaid ABN signed for this year. Patient consents for services of paring of porokeratoses today. Copy has been placed in patient chart. ?-Patient to continue soft, supportive shoe gear daily. ?-Mycotic toenails 1-5 bilaterally were debrided in length and girth with sterile nail nippers and dremel without incident. ?-Porokeratotic lesion(s) lateral aspect of b/l 5th digits, posterolateral aspect of both heels, plantar heel pad of left foot, and bilateral great toes pared and enucleated with sterile scalpel blade. Total number of lesions debrided=7. Iatrogenic laceration right 5th digit addressed with Lumicain Hemostatic Solution. Cleansed with alcohol. Triple antibiotic ointment and band-aid applied. Patient instructed to apply triple antibiotic ointment to digit once daily for one week. Call office if he has any problems. ?-For tinea pedis, Rx sent to pharmacy for Clotrimazole Cream 1% to be applied twice daily for six weeks. ?-Patient/POA to call should there be question/concern in the interim.  ? ?Return in about 3 months (around 07/22/2022). ? ?Marzetta Board, DPM  ?

## 2022-05-06 ENCOUNTER — Other Ambulatory Visit: Payer: Self-pay | Admitting: Gastroenterology

## 2022-05-17 ENCOUNTER — Other Ambulatory Visit: Payer: Self-pay

## 2022-05-17 DIAGNOSIS — K7030 Alcoholic cirrhosis of liver without ascites: Secondary | ICD-10-CM

## 2022-05-17 NOTE — Progress Notes (Signed)
Order placed for RUQ U/S for [facility] screening for end of month

## 2022-05-18 ENCOUNTER — Telehealth: Payer: Self-pay

## 2022-05-18 NOTE — Telephone Encounter (Signed)
-----   Message from Roetta Sessions, [place] sent at 12/13/2021  8:29 AM EST ----- Regarding: due for [facility] screening in late June Patient will be due for RUQ ultrasound around 06-09-22

## 2022-05-18 NOTE — Telephone Encounter (Signed)
Patient scheduled for RUQ at [facility] on June 29th at 8:30 am. NPO 8 hours. Arrive at 8:00 am am. Letter has been mailed to patient. Spoke to patient's brother Mliss Fritz, who will take him and confirmed appointment. Letter mailed with appointment info

## 2022-06-05 ENCOUNTER — Telehealth: Payer: Self-pay

## 2022-06-05 DIAGNOSIS — R772 Abnormality of alphafetoprotein: Secondary | ICD-10-CM

## 2022-06-05 DIAGNOSIS — K7030 Alcoholic cirrhosis of liver without ascites: Secondary | ICD-10-CM

## 2022-06-08 ENCOUNTER — Ambulatory Visit (HOSPITAL_COMMUNITY): Payer: Medicaid Other

## 2022-07-03 ENCOUNTER — Encounter: Payer: Self-pay | Admitting: Gastroenterology

## 2022-07-17 ENCOUNTER — Telehealth: Payer: Self-pay | Admitting: Gastroenterology

## 2022-07-17 NOTE — Telephone Encounter (Signed)
[place], Dr. Havery Moros is not in the office everyday and 10/4 is his next available appt. If pt wishes to be seen sooner he can be scheduled for next available APP appt. Thanks

## 2022-07-17 NOTE — Telephone Encounter (Signed)
Inbound call from patient brother calling to schedule F/U apt for patient. Offered next available F/U  for October 4. Brother states he has spoken with a provider and they let him know that they can ''squeeze him in Please give a call to further advise.  Thank you

## 2022-07-28 ENCOUNTER — Encounter: Payer: Self-pay | Admitting: Podiatry

## 2022-07-28 ENCOUNTER — Ambulatory Visit: Payer: Medicaid Other | Admitting: Podiatry

## 2022-07-28 DIAGNOSIS — Z89422 Acquired absence of other left toe(s): Secondary | ICD-10-CM | POA: Diagnosis not present

## 2022-07-28 DIAGNOSIS — B353 Tinea pedis: Secondary | ICD-10-CM | POA: Diagnosis not present

## 2022-07-28 DIAGNOSIS — M79674 Pain in right toe(s): Secondary | ICD-10-CM

## 2022-07-28 DIAGNOSIS — M79675 Pain in left toe(s): Secondary | ICD-10-CM

## 2022-07-28 DIAGNOSIS — Q828 Other specified congenital malformations of skin: Secondary | ICD-10-CM | POA: Diagnosis not present

## 2022-07-28 DIAGNOSIS — B351 Tinea unguium: Secondary | ICD-10-CM

## 2022-07-28 MED ORDER — KETOCONAZOLE 2 % EX CREA: TOPICAL_CREAM | CUTANEOUS | 1 refills | Status: DC

## 2022-07-28 NOTE — Patient Instructions (Signed)
Athlete's Foot Athlete's foot (tinea pedis) is a fungal infection of the skin on your feet. It often occurs on the skin that is between or underneath the toes. It can also occur on the soles of your feet. The infection can spread from person to person (is contagious). It can also spread when a person's bare feet come in contact with the fungus on shower floors or on items such as shoes. What are the causes? This condition is caused by a fungus that grows in warm, moist places. You can get athlete's foot by sharing shoes, shower stalls, towels, and wet floors with someone who is infected. Not washing your feet or changing your socks often enough can also lead to athlete's foot. What increases the risk? This condition is more likely to develop in: Men. People who have a weak body defense system (immune system). People who have diabetes. People who use public showers, such as at a gym. People who wear heavy-duty shoes, such as industrial or military shoes. Seasons with warm, humid weather. What are the signs or symptoms? Symptoms of this condition include: Itchy areas between your toes or on the soles of your feet. White, flaky, or scaly areas between your toes or on the soles of your feet. Very itchy small blisters between your toes or on the soles of your feet. Small cuts in your skin. These cuts can become infected. Thick or discolored toenails. How is this diagnosed? This condition may be diagnosed with a physical exam and a review of your medical history. Your health care provider may also take a skin or toenail sample to examine under a microscope. How is this treated? This condition is treated with antifungal medicines. These may be applied as powders, ointments, or creams. In severe cases, an oral antifungal medicine may be given. Follow these instructions at home: Medicines Apply or take over-the-counter and prescription medicines only as told by your health care provider. Apply your  antifungal medicine as told by your health care provider. Do not stop using the antifungal even if your condition improves. Foot care Do not scratch your feet. Keep your feet dry: Wear cotton or wool socks. Change your socks every day or if they become wet. Wear shoes that allow air to flow, such as sandals or canvas tennis shoes. Wash and dry your feet, including the area between your toes. Also, wash and dry your feet: Every day or as told by your health care provider. After exercising. General instructions Do not let others use towels, shoes, nail clippers, or other personal items that touch your feet. Protect your feet by wearing sandals in wet areas, such as locker rooms and shared showers. Keep all follow-up visits. This is important. If you have diabetes, keep your blood sugar under control. Contact a health care provider if: You have a fever. You have swelling, soreness, warmth, or redness in your foot. Your feet are not getting better with treatment. Your symptoms get worse. You have new symptoms. You have severe pain. Summary Athlete's foot (tinea pedis) is a fungal infection of the skin on your feet. It often occurs on skin that is between or underneath the toes. This condition is caused by a fungus that grows in warm, moist places. Symptoms include white, flaky, or scaly areas between your toes or on the soles of your feet. This condition is treated with antifungal medicines. Keep your feet clean. Always dry them thoroughly. This information is not intended to replace advice given to you by   your health care provider. Make sure you discuss any questions you have with your health care provider. Document Revised: 03/20/2021 Document Reviewed: 03/20/2021 Elsevier Patient Education  2023 Elsevier Inc.  

## 2022-08-06 NOTE — Progress Notes (Signed)
  Subjective:  Patient ID: Stephen Carroll, male    DOB: Feb 25, 1966,  MRN: [id number]  Neizan E Checketts presents to clinic today for at risk foot care. Patient has h/o amputation of digital amputation left second digit and painful porokeratotic lesion(s) both feet and painful mycotic toenails that limit ambulation. Painful toenails interfere with ambulation. Aggravating factors include wearing enclosed shoe gear. Pain is relieved with periodic professional debridement. Painful porokeratotic lesions are aggravated when weightbearing with and without shoegear. Pain is relieved with periodic professional debridement.  PCP is L[place]and, Lorrin Goodell, DO , and last visit was in 2022, per patient recall.  No Known Allergies  Review of Systems: Negative except as noted in the HPI.  Objective: Mr. Hoffert is a pleasant 56 y.o. African American male, WD, WN in NAD. AAO x 3.  Vascular Examination: Palpable pedal pulses b/l LE. Digital hair present b/l. No pedal edema b/l. Skin temperature gradient WNL b/l. No varicosities b/l. CFT <3 seconds b/l LE. No pain with calf compression b/l.Marland Kitchen  Dermatological Examination: Pedal skin with normal turgor, texture and tone b/l. No open wounds. No interdigital macerations b/l. Toenails bilateral great toes, right 2nd toe and digits 3-5 b/l thickened, discolored, dystrophic with subungual debris. There is pain on palpation to dorsal aspect of nailplates. Porokeratotic lesion(s) plantar heel pad of left foot, medial IPJ of left great toe, medial IPJ of right great toe, and lateral nailfold bilateral 5th digits. No erythema, no edema, no drainage, no fluctuance. Diffuse scaling noted peripherally and plantarly b/l feet.  Mild foot odor. No interdigital macerations.  No blisters, no weeping. No signs of secondary bacterial infection noted..  Neurological Examination: Protective sensation intact with 10 gram monofilament b/l LE. Vibratory sensation intact b/l LE.   Musculoskeletal  Examination: Muscle strength 5/5 to all LE muscle groups b/l. HAV with bunion deformity noted b/l LE. Hammertoe(s) noted to the bilateral 3rd toes, bilateral 4th toes, and R 2nd toe. 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     Latest Ref Rng & Units 03/15/2022    6:13 PM  Hemoglobin A1C  Hemoglobin-A1c 4.8 - 5.6 % 4.7    Assessment/Plan: 1. Pain due to onychomycosis of toenails of both feet   2. Tinea pedis of both feet   3. Porokeratosis   4. Status post amputation of lesser toe of left foot ([place])   -Examined patient. -Medicaid ABN signed for services of paring of corn(s)/callus(es)/porokeratos(es) today. Copy in patient chart. -Patient to continue soft, supportive shoe gear daily. -Toenails 1-5 b/l were debrided in length and girth with sterile nail nippers and dremel without iatrogenic bleeding.  -Porokeratotic lesion(s) plantar heel pad of left foot, medial IPJ of left great toe, medial IPJ of right great toe, and lateral nailfold bilateral 5th digits pared and enucleated with sterile scalpel blade without incident. Total number of lesions debrided=5. -Patient instructed to spray shoes with Lysol every evening. -For tinea pedis, Rx sent to pharmacy for Ketoconazole Cream 2% to be applied once daily for six weeks. -Patient/POA to call should there be question/concern in the interim.   Return in about 3 months (around 10/28/2022).  Marzetta Board, DPM

## 2022-08-12 ENCOUNTER — Other Ambulatory Visit: Payer: Self-pay | Admitting: Podiatrist

## 2022-08-18 ENCOUNTER — Encounter: Payer: Self-pay | Admitting: Gastroenterology

## 2022-08-18 ENCOUNTER — Ambulatory Visit: Payer: Medicaid Other | Admitting: Gastroenterology

## 2022-08-18 ENCOUNTER — Other Ambulatory Visit (INDEPENDENT_AMBULATORY_CARE_PROVIDER_SITE_OTHER): Payer: Medicaid Other

## 2022-08-18 VITALS — BP 128/78 | HR 82 | Ht 68.0 in | Wt 140.8 lb

## 2022-08-18 DIAGNOSIS — K7030 Alcoholic cirrhosis of liver without ascites: Secondary | ICD-10-CM

## 2022-08-18 LAB — CBC WITH DIFFERENTIAL/PLATELET
Basophils Absolute: 0 10*3/uL (ref 0.0–0.1)
Basophils Relative: 0.3 % (ref 0.0–3.0)
Eosinophils Absolute: 0.1 10*3/uL (ref 0.0–0.7)
Eosinophils Relative: 1.7 % (ref 0.0–5.0)
HCT: 32.5 % — ABNORMAL LOW (ref 39.0–52.0)
Hemoglobin: 10.9 g/dL — ABNORMAL LOW (ref 13.0–17.0)
Lymphocytes Relative: 38.6 % (ref 12.0–46.0)
Lymphs Abs: 1.2 10*3/uL (ref 0.7–4.0)
MCHC: 33.4 g/dL (ref 30.0–36.0)
MCV: 88 fl (ref 78.0–100.0)
Monocytes Absolute: 0.5 10*3/uL (ref 0.1–1.0)
Monocytes Relative: 14.7 % — ABNORMAL HIGH (ref 3.0–12.0)
Neutro Abs: 1.4 10*3/uL (ref 1.4–7.7)
Neutrophils Relative %: 44.7 % (ref 43.0–77.0)
Platelets: 134 10*3/uL — ABNORMAL LOW (ref 150.0–400.0)
RBC: 3.7 Mil/uL — ABNORMAL LOW (ref 4.22–5.81)
RDW: 13.5 % (ref 11.5–15.5)
WBC: 3.2 10*3/uL — ABNORMAL LOW (ref 4.0–10.5)

## 2022-08-18 LAB — COMPREHENSIVE METABOLIC PANEL
ALT: 24 U/L (ref 0–53)
AST: 54 U/L — ABNORMAL HIGH (ref 0–37)
Albumin: 3.8 g/dL (ref 3.5–5.2)
Alkaline Phosphatase: 57 U/L (ref 39–117)
BUN: 4 mg/dL — ABNORMAL LOW (ref 6–23)
CO2: 28 mEq/L (ref 19–32)
Calcium: 9 mg/dL (ref 8.4–10.5)
Chloride: 100 mEq/L (ref 96–112)
Creatinine, Ser: 0.64 mg/dL (ref 0.40–1.50)
GFR: 106.64 mL/min (ref >60.00)
Glucose, Bld: 86 mg/dL (ref 70–99)
Potassium: 3.8 mEq/L (ref 3.5–5.1)
Sodium: 137 mEq/L (ref 135–145)
Total Bilirubin: 0.7 mg/dL (ref 0.2–1.2)
Total Protein: 8.9 g/dL — ABNORMAL HIGH (ref 6.0–8.3)

## 2022-08-18 LAB — PROTIME-INR
INR: 1.2 ratio — ABNORMAL HIGH (ref 0.8–1.0)
Prothrombin Time: 13.1 s (ref 9.6–13.1)

## 2022-08-18 NOTE — Patient Instructions (Signed)
If you are age 55 or older, your body mass index should be between 23-30. Your Body mass index is 21.41 kg/m. If this is out of the aforementioned range listed, please consider follow up with your Primary Care Provider.  If you are age 3 or younger, your body mass index should be between 19-25. Your Body mass index is 21.41 kg/m. If this is out of the aformentioned range listed, please consider follow up with your Primary Care Provider.   ________________________________________________________  The [facility] providers would like to encourage you to use [facility] to communicate with providers for non-urgent requests or questions.  Due to long hold times on the telephone, sending your provider a message by [facility] may be a faster and more efficient way to get a response.  Please allow 48 business hours for a response.  Please remember that this is for non-urgent requests.  _______________________________________________________   Your provider has requested that you go to the basement level for lab work before leaving today. Press "B" on the elevator. The lab is located at the first door on the left as you exit the elevator.   Follow up in 6 months.   Due to recent changes in healthcare laws, you may see the results of your imaging and laboratory studies on MyChart before your provider has had a chance to review them.  We understand that in some cases there may be results that are confusing or concerning to you. Not all laboratory results come back in the same time frame and the provider may be waiting for multiple results in order to interpret others.  Please give [place] 48 hours in order for your provider to thoroughly review all the results before contacting the office for clarification of your results.    It was a pleasure to see you today!  Thank you for trusting me with your gastrointestinal care!
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
## 2022-08-18 NOTE — Progress Notes (Signed)
Agree with assessment and plan as outlined.  

## 2022-08-21 LAB — AFP TUMOR MARKER: AFP-Tumor Marker: 11.7 ng/mL — ABNORMAL HIGH (ref <6.1)

## 2022-08-22 ENCOUNTER — Ambulatory Visit (HOSPITAL_COMMUNITY): Payer: Medicaid Other

## 2022-08-22 NOTE — Telephone Encounter (Signed)
Approved refill of ammonium lactate 12% cream.

## 2022-08-23 LAB — PHOSPHATIDYLETHANOL (PETH)
Phosphatidylethanol (PEth): 1602 ng/mL
Phosphatidylethanol: POSITIVE — AB

## 2022-09-07 ENCOUNTER — Ambulatory Visit (HOSPITAL_COMMUNITY)
Admission: RE | Admit: 2022-09-07 | Discharge: 2022-09-07 | Disposition: A | Discharge status: Home / Self Care | Payer: Medicaid Other | Source: Ambulatory Visit | Attending: Gastroenterology | Admitting: Gastroenterology

## 2022-09-07 DIAGNOSIS — R772 Abnormality of alphafetoprotein: Secondary | ICD-10-CM | POA: Insufficient documentation

## 2022-09-07 DIAGNOSIS — K7030 Alcoholic cirrhosis of liver without ascites: Secondary | ICD-10-CM | POA: Insufficient documentation

## 2022-09-07 DIAGNOSIS — K7460 Unspecified cirrhosis of liver: Secondary | ICD-10-CM | POA: Diagnosis not present

## 2022-09-07 DIAGNOSIS — K760 Fatty (change of) liver, not elsewhere classified: Secondary | ICD-10-CM | POA: Diagnosis not present

## 2022-09-07 MED ORDER — GADOPICLENOL 0.5 MMOL/ML IV SOLN
7.0000 mL | Freq: Once | INTRAVENOUS | Status: AC | PRN
  Administered 2022-09-07: 7 mL via INTRAVENOUS

## 2022-11-07 ENCOUNTER — Other Ambulatory Visit: Payer: Self-pay | Admitting: Gastroenterology

## 2022-11-15 ENCOUNTER — Encounter: Payer: Self-pay | Admitting: Podiatry

## 2022-11-15 ENCOUNTER — Ambulatory Visit (INDEPENDENT_AMBULATORY_CARE_PROVIDER_SITE_OTHER): Payer: Medicaid Other | Admitting: Podiatry

## 2022-11-15 VITALS — BP 143/78

## 2022-11-15 DIAGNOSIS — M79674 Pain in right toe(s): Secondary | ICD-10-CM | POA: Diagnosis not present

## 2022-11-15 DIAGNOSIS — Z89422 Acquired absence of other left toe(s): Secondary | ICD-10-CM

## 2022-11-15 DIAGNOSIS — M79675 Pain in left toe(s): Secondary | ICD-10-CM

## 2022-11-15 DIAGNOSIS — Q828 Other specified congenital malformations of skin: Secondary | ICD-10-CM

## 2022-11-15 DIAGNOSIS — B351 Tinea unguium: Secondary | ICD-10-CM

## 2022-11-20 NOTE — Progress Notes (Signed)
  Subjective:  Patient ID: Stephen Carroll, male    DOB: 10-18-1966,  MRN: [id number]  Stephen Carroll presents to clinic today for at risk foot care. Patient has h/o amputation of digital amputation left second digit and painful porokeratotic lesion(s) b/l lower extremities and painful mycotic toenails that limit ambulation. Painful toenails interfere with ambulation. Aggravating factors include wearing enclosed shoe gear. Pain is relieved with periodic professional debridement. Painful porokeratotic lesions are aggravated when weightbearing with and without shoegear. Pain is relieved with periodic professional debridement.  Chief Complaint  Patient presents with   Nail Problem    Routine foot care PCP- Liland, Alana PCP VST- Does not remember    New problem(s): None.   PCP is Lilland, Alana, DO.  No Known Allergies  Review of Systems: Negative except as noted in the HPI.  Objective: No changes noted in today's physical examination. Vitals:   11/15/22 1609  BP: (!) 143/78   Stephen Carroll is a pleasant 56 y.o. male WD, WN in NAD. AAO x 3.  Vascular Examination: Palpable pedal pulses b/l LE. Digital hair present b/l. No pedal edema b/l. Skin temperature gradient WNL b/l. No varicosities b/l. CFT <3 seconds b/l LE. No pain with calf compression b/l.Marland Kitchen  Dermatological Examination: Pedal skin with normal turgor, texture and tone b/l. No open wounds. No interdigital macerations b/l.   Toenails bilateral great toes, right 2nd toe and digits 3-5 b/l thickened, discolored, dystrophic with subungual debris. There is pain on palpation to dorsal aspect of nailplates.   Porokeratotic lesion(s) plantar heel pad of left foot, medial IPJ of left great toe, medial IPJ of right great toe, and lateral nailfold bilateral 5th digits. No erythema, no edema, no drainage, no fluctuance.   Neurological Examination: Protective sensation intact with 10 gram monofilament b/l LE. Vibratory sensation  intact b/l LE.   Musculoskeletal Examination: Muscle strength 5/5 to all LE muscle groups b/l. HAV with bunion deformity noted b/l LE. Hammertoe(s) noted to the bilateral 3rd toes, bilateral 4th toes, and R 2nd toe. 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  Assessment/Plan: 1. Pain due to onychomycosis of toenails of both feet   2. Porokeratosis   3. Status post amputation of lesser toe of left foot ([place])     No orders of the defined types were placed in this encounter.   -Consent given for treatment as described below: -Continue supportive shoe gear daily. -Mycotic toenails 1-5 bilaterally were debrided in length and girth with sterile nail nippers and dremel without incident. -Medicaid ABN on file for corns/calluses. -Porokeratotic lesion(s) lateral nailfold b/l 5th digits, plantar heel pad of left foot, and bilateral great toes pared and enucleated with sterile currette. Total number of lesions debrided=5. Pinpoint bleeding right 5th digit addressed with Lumicain Hemostatic Solution. Cleansed with alcohol. TAO and band-aid applied. He may remove band-aid on tomorrow. No further treatment required by patient. -Patient/POA to call should there be question/concern in the interim.   Return in about 3 months (around 02/14/2023).  Marzetta Board, DPM

## 2022-12-21 ENCOUNTER — Other Ambulatory Visit: Payer: Self-pay | Admitting: Gastroenterology

## 2022-12-21 DIAGNOSIS — Z86010 Personal history of colonic polyps: Secondary | ICD-10-CM

## 2023-01-13 ENCOUNTER — Other Ambulatory Visit: Payer: Self-pay | Admitting: Gastroenterology

## 2023-02-13 ENCOUNTER — Emergency Department (HOSPITAL_COMMUNITY): Payer: Medicaid Other

## 2023-02-13 ENCOUNTER — Other Ambulatory Visit: Payer: Self-pay

## 2023-02-13 ENCOUNTER — Emergency Department (HOSPITAL_COMMUNITY)
Admission: EM | Admit: 2023-02-13 | Discharge: 2023-02-13 | Disposition: A | Discharge status: Home / Self Care | Payer: Medicaid Other | Attending: Emergency Medicine | Admitting: Emergency Medicine

## 2023-02-13 DIAGNOSIS — R253 Fasciculation: Secondary | ICD-10-CM | POA: Diagnosis not present

## 2023-02-13 DIAGNOSIS — R55 Syncope and collapse: Secondary | ICD-10-CM | POA: Diagnosis not present

## 2023-02-13 DIAGNOSIS — E871 Hypo-osmolality and hyponatremia: Secondary | ICD-10-CM

## 2023-02-13 DIAGNOSIS — F1010 Alcohol abuse, uncomplicated: Secondary | ICD-10-CM

## 2023-02-13 DIAGNOSIS — R112 Nausea with vomiting, unspecified: Secondary | ICD-10-CM

## 2023-02-13 DIAGNOSIS — K7460 Unspecified cirrhosis of liver: Secondary | ICD-10-CM | POA: Diagnosis not present

## 2023-02-13 DIAGNOSIS — E876 Hypokalemia: Secondary | ICD-10-CM | POA: Diagnosis not present

## 2023-02-13 DIAGNOSIS — Z1152 Encounter for screening for COVID-19: Secondary | ICD-10-CM | POA: Diagnosis not present

## 2023-02-13 DIAGNOSIS — K760 Fatty (change of) liver, not elsewhere classified: Secondary | ICD-10-CM | POA: Diagnosis not present

## 2023-02-13 DIAGNOSIS — F10139 Alcohol abuse with withdrawal, unspecified: Secondary | ICD-10-CM | POA: Diagnosis not present

## 2023-02-13 DIAGNOSIS — R509 Fever, unspecified: Secondary | ICD-10-CM | POA: Diagnosis not present

## 2023-02-13 DIAGNOSIS — I10 Essential (primary) hypertension: Secondary | ICD-10-CM | POA: Diagnosis not present

## 2023-02-13 DIAGNOSIS — F10939 Alcohol use, unspecified with withdrawal, unspecified: Secondary | ICD-10-CM

## 2023-02-13 DIAGNOSIS — R0902 Hypoxemia: Secondary | ICD-10-CM | POA: Diagnosis not present

## 2023-02-13 LAB — LIPASE, BLOOD: Lipase: 28 U/L (ref 11–51)

## 2023-02-13 LAB — AMMONIA: Ammonia: 20 umol/L (ref 9–35)

## 2023-02-13 LAB — RESP PANEL BY RT-PCR (RSV, FLU A&B, COVID)  RVPGX2
Influenza A by PCR: NEGATIVE
Influenza B by PCR: NEGATIVE
Resp Syncytial Virus by PCR: NEGATIVE
SARS Coronavirus 2 by RT PCR: NEGATIVE

## 2023-02-13 LAB — CBC WITH DIFFERENTIAL/PLATELET
Abs Immature Granulocytes: 0.01 10*3/uL (ref 0.00–0.07)
Basophils Absolute: 0 10*3/uL (ref 0.0–0.1)
Basophils Relative: 1 %
Eosinophils Absolute: 0 10*3/uL (ref 0.0–0.5)
Eosinophils Relative: 0 %
HCT: 28.1 % — ABNORMAL LOW (ref 39.0–52.0)
Hemoglobin: 9.7 g/dL — ABNORMAL LOW (ref 13.0–17.0)
Immature Granulocytes: 0 %
Lymphocytes Relative: 16 %
Lymphs Abs: 0.4 10*3/uL — ABNORMAL LOW (ref 0.7–4.0)
MCH: 27.2 pg (ref 26.0–34.0)
MCHC: 34.5 g/dL (ref 30.0–36.0)
MCV: 78.9 fL — ABNORMAL LOW (ref 80.0–100.0)
Monocytes Absolute: 0.6 10*3/uL (ref 0.1–1.0)
Monocytes Relative: 22 %
Neutro Abs: 1.6 10*3/uL — ABNORMAL LOW (ref 1.7–7.7)
Neutrophils Relative %: 61 %
Platelets: 86 10*3/uL — ABNORMAL LOW (ref 150–400)
RBC: 3.56 MIL/uL — ABNORMAL LOW (ref 4.22–5.81)
RDW: 16.2 % — ABNORMAL HIGH (ref 11.5–15.5)
WBC: 2.7 10*3/uL — ABNORMAL LOW (ref 4.0–10.5)
nRBC: 0 % (ref 0.0–0.2)

## 2023-02-13 LAB — ETHANOL: Alcohol, Ethyl (B): 118 mg/dL — ABNORMAL HIGH (ref <10)

## 2023-02-13 LAB — RAPID URINE DRUG SCREEN, HOSP PERFORMED
Amphetamines: NOT DETECTED
Barbiturates: NOT DETECTED
Benzodiazepines: NOT DETECTED
Cocaine: NOT DETECTED
Opiates: NOT DETECTED
Tetrahydrocannabinol: POSITIVE — AB

## 2023-02-13 LAB — COMPREHENSIVE METABOLIC PANEL
ALT: 36 U/L (ref 0–44)
AST: 86 U/L — ABNORMAL HIGH (ref 15–41)
Albumin: 3.2 g/dL — ABNORMAL LOW (ref 3.5–5.0)
Alkaline Phosphatase: 46 U/L (ref 38–126)
Anion gap: 9 (ref 5–15)
BUN: <5 mg/dL — ABNORMAL LOW (ref 6–20)
CO2: 22 mmol/L (ref 22–32)
Calcium: 7.4 mg/dL — ABNORMAL LOW (ref 8.9–10.3)
Chloride: 95 mmol/L — ABNORMAL LOW (ref 98–111)
Creatinine, Ser: 0.57 mg/dL — ABNORMAL LOW (ref 0.61–1.24)
GFR, Estimated: >60 mL/min (ref >60)
Glucose, Bld: 90 mg/dL (ref 70–99)
Potassium: 2.9 mmol/L — ABNORMAL LOW (ref 3.5–5.1)
Sodium: 126 mmol/L — ABNORMAL LOW (ref 135–145)
Total Bilirubin: 0.6 mg/dL (ref 0.3–1.2)
Total Protein: 7.7 g/dL (ref 6.5–8.1)

## 2023-02-13 LAB — URINALYSIS, ROUTINE W REFLEX MICROSCOPIC
Bacteria, UA: NONE SEEN
Bilirubin Urine: NEGATIVE
Glucose, UA: NEGATIVE mg/dL
Ketones, ur: 5 mg/dL — AB
Leukocytes,Ua: NEGATIVE
Nitrite: NEGATIVE
Protein, ur: NEGATIVE mg/dL
RBC / HPF: >50 RBC/hpf (ref 0–5)
Specific Gravity, Urine: 1.003 — ABNORMAL LOW (ref 1.005–1.030)
pH: 7 (ref 5.0–8.0)

## 2023-02-13 LAB — MAGNESIUM: Magnesium: 1.8 mg/dL (ref 1.7–2.4)

## 2023-02-13 LAB — TROPONIN I (HIGH SENSITIVITY): Troponin I (High Sensitivity): 6 ng/L (ref <18)

## 2023-02-13 MED ORDER — LORAZEPAM 1 MG PO TABS
1.0000 mg | ORAL_TABLET | Freq: Once | ORAL | Status: AC
  Administered 2023-02-13: 1 mg via ORAL
  Filled 2023-02-13: qty 1

## 2023-02-13 MED ORDER — AMOXICILLIN-POT CLAVULANATE 875-125 MG PO TABS: 1.0000 | ORAL_TABLET | Freq: Two times a day (BID) | ORAL | 0 refills | Status: DC

## 2023-02-13 MED ORDER — LORAZEPAM 1 MG PO TABS
0.0000 mg | ORAL_TABLET | Freq: Four times a day (QID) | ORAL | Status: DC
  Administered 2023-02-13: 1 mg via ORAL
  Filled 2023-02-13: qty 1

## 2023-02-13 MED ORDER — LACTATED RINGERS IV BOLUS: 500.0000 mL | Freq: Once | INTRAVENOUS | Status: DC

## 2023-02-13 MED ORDER — ADULT MULTIVITAMIN W/MINERALS CH
1.0000 | ORAL_TABLET | Freq: Every day | ORAL | Status: DC
  Administered 2023-02-13: 1 via ORAL
  Filled 2023-02-13: qty 1

## 2023-02-13 MED ORDER — IOHEXOL 300 MG/ML  SOLN
100.0000 mL | Freq: Once | INTRAMUSCULAR | Status: AC | PRN
  Administered 2023-02-13: 100 mL via INTRAVENOUS

## 2023-02-13 MED ORDER — ONDANSETRON 4 MG PO TBDP: ORAL_TABLET | ORAL | 0 refills | Status: DC

## 2023-02-13 MED ORDER — POTASSIUM CHLORIDE CRYS ER 20 MEQ PO TBCR
40.0000 meq | EXTENDED_RELEASE_TABLET | ORAL | Status: AC
  Administered 2023-02-13: 40 meq via ORAL
  Filled 2023-02-13: qty 2

## 2023-02-13 MED ORDER — FOLIC ACID 1 MG PO TABS
1.0000 mg | ORAL_TABLET | Freq: Every day | ORAL | Status: DC
  Administered 2023-02-13: 1 mg via ORAL
  Filled 2023-02-13: qty 1

## 2023-02-13 MED ORDER — LACTATED RINGERS IV BOLUS
1000.0000 mL | Freq: Once | INTRAVENOUS | Status: AC
  Administered 2023-02-13: 1000 mL via INTRAVENOUS

## 2023-02-13 MED ORDER — LORAZEPAM 1 MG PO TABS: 0.0000 mg | ORAL_TABLET | Freq: Two times a day (BID) | ORAL | Status: DC

## 2023-02-13 MED ORDER — THIAMINE HCL 100 MG/ML IJ SOLN: 100.0000 mg | Freq: Every day | INTRAMUSCULAR | Status: DC

## 2023-02-13 MED ORDER — DOXYCYCLINE HYCLATE 100 MG PO CAPS: 100.0000 mg | ORAL_CAPSULE | Freq: Two times a day (BID) | ORAL | 0 refills | Status: DC

## 2023-02-13 MED ORDER — THIAMINE MONONITRATE 100 MG PO TABS
100.0000 mg | ORAL_TABLET | Freq: Every day | ORAL | Status: DC
  Administered 2023-02-13: 100 mg via ORAL
  Filled 2023-02-13: qty 1

## 2023-02-13 MED ORDER — ONDANSETRON HCL 4 MG/2ML IJ SOLN
4.0000 mg | Freq: Once | INTRAMUSCULAR | Status: AC
  Administered 2023-02-13: 4 mg via INTRAVENOUS
  Filled 2023-02-13: qty 2

## 2023-02-13 MED ORDER — ACETAMINOPHEN 500 MG PO TABS
1000.0000 mg | ORAL_TABLET | ORAL | Status: AC
  Administered 2023-02-13: 1000 mg via ORAL
  Filled 2023-02-13: qty 2

## 2023-02-13 NOTE — ED Notes (Signed)
Call [phone] for pick up. Sister Seth Bake)

## 2023-02-13 NOTE — Discharge Instructions (Signed)
I am concerned that you are sick and need to come into the hospital.  You have elected to go home and follow-up with your doctor.  As we talked about in the room please return for any worsening symptoms, if you are unable to eat or drink or if you develop worsening confusion.

## 2023-02-13 NOTE — Progress Notes (Signed)
TOC consulted for [employer]. CSW spoke with pt at bedside who is accepting of SA resources being attached. This CSW has attached resources to pt's AVS. No futher TOC needs at this time. TOC signing off.

## 2023-02-13 NOTE — ED Provider Notes (Signed)
Stephen Provider Note   CSN: [id number] Arrival date & time: 02/13/23  1119     History  Chief Complaint  Patient presents with   Nausea   Emesis    Stephen Carroll is a 57 y.o. male.  57 year old male with a history of alcohol abuse, alcoholic cirrhosis, alcohol withdrawals, chronic hyponatremia presents to the emergency department with nausea and vomiting.  Patient was in his usual state of health until last night when he said that he went and got a sob out of the refrigerator and started having nonbloody nonbilious emesis.  Says that he has been drinking alcohol but last drink was last night when he had 2 standard size light beers.  Denies any other EtOH use.  Denies any diarrhea, runny nose, sore throat, cough.  No dysuria or frequency.  No significant abdominal pain.  Did state that he started having hiccups since he vomited.  Says that he feels dehydrated.  Also states that he had an episode of syncope when he was urinating earlier today which he attributes to dehydration.       Home Medications Prior to Admission medications   Medication Sig Start Date End Date Taking? Authorizing Provider  ammonium lactate (AMLACTIN) 12 % cream Apply topically 2 (two) times daily. 08/22/22   Marzetta Board, DPM  ammonium lactate (AMLACTIN) 12 % lotion Apply 1 application topically as needed for dry skin. Patient taking differently: Apply 1 application. topically in the morning and at bedtime. 01/20/22 08/22/23  Gardiner Barefoot, DPM  clotrimazole (LOTRIMIN) 1 % cream Apply to both feet and between toes twice daily 04/21/22   Marzetta Board, DPM  folic acid (FOLVITE) 1 MG tablet Take 1 tablet (1 mg total) by mouth daily. 03/20/22   Erskine Emery, MD  furosemide (LASIX) 40 MG tablet Take 1 tablet (40 mg total) by mouth daily. Please call asap to schedule an office visit. Thank you. 01/15/23   Armbruster, Carlota Raspberry, MD  ketoconazole (NIZORAL) 2  % cream Apply to both feet and between toes once daily for 6 weeks. 07/28/22   Marzetta Board, DPM  Multiple Vitamin (MULTIVITAMIN WITH MINERALS) TABS tablet Take 1 tablet by mouth daily. 03/20/22   Erskine Emery, MD  ondansetron (ZOFRAN) 4 MG tablet Take 1 tablet (4 mg total) by mouth every 8 (eight) hours as needed for nausea or vomiting. Please schedule a follow up appointment in March for further refills 12/21/22   Armbruster, Carlota Raspberry, MD  propranolol (INDERAL) 20 MG tablet Take 1 tablet (20 mg total) by mouth 2 (two) times daily. Please schedule a follow up appointment in March: [phone]. Thank you 12/21/22   Armbruster, Carlota Raspberry, MD  spironolactone (ALDACTONE) 100 MG tablet Take 1 tablet (100 mg total) by mouth daily. You will be due for a follow up appointment March 2024. Please call next month to schedule 11/08/22   Armbruster, Carlota Raspberry, MD  thiamine 100 MG tablet Take 1 tablet (100 mg total) by mouth daily. 03/20/22   Erskine Emery, MD      Allergies    Patient has no known allergies.    Review of Systems   Review of Systems  Physical Exam Updated Vital Signs BP (!) 177/100   Pulse (!) 105   Temp (!) 102.4 F (39.1 C) (Oral)   Resp (!) 22   SpO2 95%  Physical Exam Vitals and nursing note reviewed.  Constitutional:  General: He is not in acute distress.    Appearance: He is well-developed.     Comments: Mildly anxious appearing.  Does have slight tremor of outstretched hands.  HENT:     Head: Normocephalic and atraumatic.     Right Ear: External ear normal.     Left Ear: External ear normal.     Nose: Nose normal.     Mouth/Throat:     Mouth: Mucous membranes are dry.     Pharynx: Oropharynx is clear.  Eyes:     Extraocular Movements: Extraocular movements intact.     Conjunctiva/sclera: Conjunctivae normal.     Pupils: Pupils are equal, round, and reactive to light.  Cardiovascular:     Rate and Rhythm: Normal rate and regular rhythm.     Heart sounds:  Normal heart sounds.  Pulmonary:     Effort: Pulmonary effort is normal. No respiratory distress.     Breath sounds: Normal breath sounds.  Abdominal:     General: There is distension.     Palpations: Abdomen is soft. There is no mass.     Tenderness: There is no abdominal tenderness. There is no guarding.  Musculoskeletal:     Cervical back: Normal range of motion and neck supple.     Right lower leg: No edema.     Left lower leg: No edema.  Skin:    General: Skin is warm and dry.  Neurological:     Mental Status: He is alert and oriented to person, place, and time. Mental status is at baseline.     Comments: Mild tongue fasciculations  Psychiatric:        Mood and Affect: Mood normal.        Behavior: Behavior normal.     ED Results / Procedures / Treatments   Labs (all labs ordered are listed, but only abnormal results are displayed) Labs Reviewed  COMPREHENSIVE METABOLIC PANEL - Abnormal; Notable for the following components:      Result Value   Sodium 126 (*)    Potassium 2.9 (*)    Chloride 95 (*)    BUN <5 (*)    Creatinine, Ser 0.57 (*)    Calcium 7.4 (*)    Albumin 3.2 (*)    AST 86 (*)    All other components within normal limits  CBC WITH DIFFERENTIAL/PLATELET - Abnormal; Notable for the following components:   WBC 2.7 (*)    RBC 3.56 (*)    Hemoglobin 9.7 (*)    HCT 28.1 (*)    MCV 78.9 (*)    RDW 16.2 (*)    Platelets 86 (*)    Neutro Abs 1.6 (*)    Lymphs Abs 0.4 (*)    All other components within normal limits  ETHANOL - Abnormal; Notable for the following components:   Alcohol, Ethyl (B) 118 (*)    All other components within normal limits  RAPID URINE DRUG SCREEN, HOSP PERFORMED - Abnormal; Notable for the following components:   Tetrahydrocannabinol POSITIVE (*)    All other components within normal limits  RESP PANEL BY RT-PCR (RSV, FLU A&B, COVID)  RVPGX2  LIPASE, BLOOD  AMMONIA  MAGNESIUM  URINALYSIS, ROUTINE W REFLEX MICROSCOPIC   TROPONIN I (HIGH SENSITIVITY)  TROPONIN I (HIGH SENSITIVITY)    EKG EKG Interpretation  Date/Time:  Tuesday February 13 2023 12:40:57 EST Ventricular Rate:  85 PR Interval:  176 QRS Duration: 108 QT Interval:  378 QTC Calculation: 450 R Axis:  8 Text Interpretation: Sinus rhythm Borderline T abnormalities, anterior leads Confirmed by Margaretmary Eddy [phone]) on 02/13/2023 1:50:32 PM  Radiology DG Chest Portable 1 View  Result Date: 02/13/2023 CLINICAL DATA:  Syncope and fever. EXAM: PORTABLE CHEST 1 VIEW COMPARISON:  Chest x-ray dated January 05, 2020. FINDINGS: The heart size and mediastinal contours are within normal limits. Normal pulmonary vascularity. No focal consolidation, pleural effusion, or pneumothorax. No acute osseous abnormality. 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 IMPRESSION: No active disease. Electronically Signed   By: Titus Dubin M.D.   On: 02/13/2023 14:22    Procedures Procedures   Medications Ordered in ED Medications  thiamine (VITAMIN B1) tablet 100 mg (100 mg Oral Given 02/13/23 1259)    Or  thiamine (VITAMIN B1) injection 100 mg ( Intravenous See Alternative [id number])  folic acid (FOLVITE) tablet 1 mg (1 mg Oral Given 02/13/23 1258)  multivitamin with minerals tablet 1 tablet (1 tablet Oral Given 02/13/23 1257)  LORazepam (ATIVAN) tablet 0-4 mg (1 mg Oral Given 02/13/23 1258)    Followed by  LORazepam (ATIVAN) tablet 0-4 mg (has no administration in time range)  acetaminophen (TYLENOL) tablet 1,000 mg (1,000 mg Oral Given 02/13/23 1257)  potassium chloride SA (KLOR-CON M) CR tablet 40 mEq (40 mEq Oral Given 02/13/23 1402)  ondansetron (ZOFRAN) injection 4 mg (4 mg Intravenous Given 02/13/23 1402)  lactated ringers bolus 1,000 mL (1,000 mLs Intravenous New Bag/Given 02/13/23 1403)  iohexol (OMNIPAQUE) 300 MG/ML solution 100 mL (100 mLs Intravenous Contrast Given 02/13/23 1631)    ED Course/ Medical Decision Making/ A&P Clinical  Course as of 02/13/23 1641  Tue Feb 13, 2023  1624 Signed out to Dr Tyrone Nine.  [RP]    Clinical Course User Index [RP] Fransico Meadow, MD                             Medical Decision Making Amount and/or Complexity of Data Reviewed Labs: ordered. Radiology: ordered.  Risk OTC drugs. Prescription drug management.   Tramond E Schaben is a 57 y.o. male with comorbidities that complicate the patient evaluation including alcohol abuse complicated by cirrhosis and alcohol withdrawals who presents emergency department with nausea and vomiting and hiccups without significant tenderness palpation on exam and fever was noted  Initial Ddx:  Alcohol intoxication, alcohol withdrawal, intra-abdominal abscess/infection, gastroenteritis, URI, arrhythmia, electrolyte abnormality  MDM:  With patient's extensive alcohol history they feel that he may likely be drinking more than 2 light beers recently.  Does appear to be potentially having some signs of withdrawal at this time so we will initiate CIWA protocol.  With his fever and nausea and vomiting and hiccups will obtain CT scan to rule out intra-abdominal infection.  Will also obtain COVID and flu since it can sometimes be preceded by GI symptoms.  With his syncopal episode will obtain electrolytes, troponin, and EKG.  Plan:  Labs Troponin Lipase COVID/flu Chest x-ray CT abdomen pelvis IV contrast EKG  ED Summary/Re-evaluation:  Patient underwent the above workup.  Did show that his alcohol level was 118.  Was also hypokalemic and was given oral potassium.  Also was found to be significantly hyponatremic.  CBC unchanged from prior.  EKG and chest x-ray were reassuring.  Signed out to Dr. Tyrone Nine awaiting results of the CT scan.  This patient presents to the ED for concern of complaints listed in HPI, this involves an extensive number of treatment options,  and is a complaint that carries with it a high risk of complications and morbidity.  Disposition including potential need for admission considered.   Dispo: Pending remainder of workup  Records reviewed ED Visit Notes The following labs were independently interpreted: Chemistry and show  sequelae of chronic alcohol use I independently reviewed the following imaging with scope of interpretation limited to determining acute life threatening conditions related to emergency care: Chest x-ray and agree with the radiologist interpretation with the following exceptions: None I personally reviewed and interpreted cardiac monitoring: normal sinus rhythm  I personally reviewed and interpreted the pt's EKG: see above for interpretation  I have reviewed the patients home medications and made adjustments as needed Social Determinants of health:  Etoh abuse  Final Clinical Impression(s) / ED Diagnoses Final diagnoses:  Alcohol abuse  Alcohol withdrawal syndrome with complication ([place])  Hypokalemia  Hyponatremia  Nausea and vomiting, unspecified vomiting type  Syncope, unspecified syncope type    Rx / DC Orders ED Discharge Orders     None         Fransico Meadow, MD 02/13/23 [phone]

## 2023-02-13 NOTE — ED Provider Notes (Signed)
Received patient in turnover from Dr. Sharlett Iles.  Please see their note for further details of Hx, PE.  Briefly patient is a 57 y.o. male with a Nausea and Emesis .  Patient has a history of alcoholism developed abdominal pain nausea and vomiting.  Febrile here to 102.4.  Plan to obtain CT scan of the abdomen pelvis to assess for possible intra-abdominal pathology.  Patient also with a hyponatremia.  Hypokalemia.  Having ongoing hiccups.  CT scan with likely gastritis, patient also with possibly right lower lobe pneumonia.  I discussed the results with the patient.  On my repeat assessment he is very cognizant and able to have a conversation with me without issue.  I told him that I felt like he needed to come into the hospital for his low sodium level with likely an infection.  He told me he would like to go home.  We had a long discussion bedside.  At this time I feel the patient is able to make his own decisions.  Encouraged him to return for any worsening.  5:20 PM:  I have discussed the diagnosis/risks/treatment options with the patient.  Evaluation and diagnostic testing in the emergency department does not suggest an emergent condition requiring admission or immediate intervention beyond what has been performed at this time.  They will follow up with PCP. We also discussed returning to the ED immediately if new or worsening sx occur. We discussed the sx which are most concerning (e.g., sudden worsening pain, fever, inability to tolerate by mouth, confusion, sob, inability to eat or drink) that necessitate immediate return. Medications administered to the patient during their visit and any new prescriptions provided to the patient are listed below.  Medications given during this visit Medications  thiamine (VITAMIN B1) tablet 100 mg (100 mg Oral Given 02/13/23 1259)    Or  thiamine (VITAMIN B1) injection 100 mg ( Intravenous See Alternative [id number])  folic acid (FOLVITE) tablet 1 mg (1 mg Oral  Given 02/13/23 1258)  multivitamin with minerals tablet 1 tablet (1 tablet Oral Given 02/13/23 1257)  LORazepam (ATIVAN) tablet 0-4 mg (1 mg Oral Given 02/13/23 1258)    Followed by  LORazepam (ATIVAN) tablet 0-4 mg (has no administration in time range)  LORazepam (ATIVAN) tablet 1 mg (has no administration in time range)  acetaminophen (TYLENOL) tablet 1,000 mg (1,000 mg Oral Given 02/13/23 1257)  potassium chloride SA (KLOR-CON M) CR tablet 40 mEq (40 mEq Oral Given 02/13/23 1402)  ondansetron (ZOFRAN) injection 4 mg (4 mg Intravenous Given 02/13/23 1402)  lactated ringers bolus 1,000 mL (1,000 mLs Intravenous New Bag/Given 02/13/23 1403)  iohexol (OMNIPAQUE) 300 MG/ML solution 100 mL (100 mLs Intravenous Contrast Given 02/13/23 1631)     The patient appears reasonably screen and/or stabilized for discharge and I doubt any other medical condition or other [facility] requiring further screening, evaluation, or treatment in the ED at this time prior to discharge.       Deno Etienne, DO 02/13/23 1721

## 2023-02-13 NOTE — ED Triage Notes (Signed)
EMS reports from home, Hx of ETOH abuse, sister states not drinking water, Had syncopal episode yesterday refused transport. C/O nausea and vomiting and hiccups.  BP 161/91 HR 84 RR 16 Sp02 99 RA CBG 106  18ga  LAC  18m NS enroute.

## 2023-02-14 ENCOUNTER — Telehealth: Payer: Self-pay | Admitting: Gastroenterology

## 2023-02-14 NOTE — Telephone Encounter (Signed)
PT sister is calling in to see if she can get a letter about PT's condition to be able to get him home health treatment. Please advise.

## 2023-02-14 NOTE — Telephone Encounter (Signed)
04/13/23 at 3:40 pm appt made to see Dr Havery Moros for follow up and discuss home health.

## 2023-02-15 ENCOUNTER — Emergency Department (HOSPITAL_COMMUNITY): Payer: Medicaid Other

## 2023-02-15 ENCOUNTER — Inpatient Hospital Stay (HOSPITAL_COMMUNITY)
Admission: EM | Admit: 2023-02-15 | Discharge: 2023-02-20 | DRG: 896 | Disposition: A | Discharge status: Home Health | Payer: Medicaid Other | Attending: Family Medicine | Admitting: Family Medicine

## 2023-02-15 ENCOUNTER — Encounter (HOSPITAL_COMMUNITY): Payer: Self-pay

## 2023-02-15 ENCOUNTER — Other Ambulatory Visit: Payer: Self-pay

## 2023-02-15 ENCOUNTER — Inpatient Hospital Stay (HOSPITAL_COMMUNITY): Payer: Medicaid Other

## 2023-02-15 DIAGNOSIS — Z6820 Body mass index (BMI) 20.0-20.9, adult: Secondary | ICD-10-CM

## 2023-02-15 DIAGNOSIS — F10939 Alcohol use, unspecified with withdrawal, unspecified: Principal | ICD-10-CM

## 2023-02-15 DIAGNOSIS — Z8249 Family history of ischemic heart disease and other diseases of the circulatory system: Secondary | ICD-10-CM | POA: Diagnosis not present

## 2023-02-15 DIAGNOSIS — F10231 Alcohol dependence with withdrawal delirium: Principal | ICD-10-CM | POA: Diagnosis present

## 2023-02-15 DIAGNOSIS — D6959 Other secondary thrombocytopenia: Secondary | ICD-10-CM | POA: Diagnosis present

## 2023-02-15 DIAGNOSIS — I10 Essential (primary) hypertension: Secondary | ICD-10-CM | POA: Diagnosis present

## 2023-02-15 DIAGNOSIS — M1990 Unspecified osteoarthritis, unspecified site: Secondary | ICD-10-CM | POA: Diagnosis present

## 2023-02-15 DIAGNOSIS — F10229 Alcohol dependence with intoxication, unspecified: Secondary | ICD-10-CM | POA: Diagnosis present

## 2023-02-15 DIAGNOSIS — Z6372 Alcoholism and drug addiction in family: Secondary | ICD-10-CM

## 2023-02-15 DIAGNOSIS — Z800 Family history of malignant neoplasm of digestive organs: Secondary | ICD-10-CM

## 2023-02-15 DIAGNOSIS — R54 Age-related physical debility: Secondary | ICD-10-CM | POA: Diagnosis present

## 2023-02-15 DIAGNOSIS — R4182 Altered mental status, unspecified: Secondary | ICD-10-CM | POA: Diagnosis present

## 2023-02-15 DIAGNOSIS — J439 Emphysema, unspecified: Secondary | ICD-10-CM | POA: Diagnosis present

## 2023-02-15 DIAGNOSIS — Z811 Family history of alcohol abuse and dependence: Secondary | ICD-10-CM

## 2023-02-15 DIAGNOSIS — G9341 Metabolic encephalopathy: Secondary | ICD-10-CM | POA: Diagnosis not present

## 2023-02-15 DIAGNOSIS — R4781 Slurred speech: Secondary | ICD-10-CM | POA: Diagnosis not present

## 2023-02-15 DIAGNOSIS — E860 Dehydration: Secondary | ICD-10-CM | POA: Diagnosis not present

## 2023-02-15 DIAGNOSIS — L89151 Pressure ulcer of sacral region, stage 1: Secondary | ICD-10-CM | POA: Diagnosis present

## 2023-02-15 DIAGNOSIS — G928 Other toxic encephalopathy: Secondary | ICD-10-CM | POA: Diagnosis present

## 2023-02-15 DIAGNOSIS — R000 Tachycardia, unspecified: Secondary | ICD-10-CM | POA: Insufficient documentation

## 2023-02-15 DIAGNOSIS — E876 Hypokalemia: Secondary | ICD-10-CM | POA: Diagnosis present

## 2023-02-15 DIAGNOSIS — Z4682 Encounter for fitting and adjustment of non-vascular catheter: Secondary | ICD-10-CM | POA: Diagnosis not present

## 2023-02-15 DIAGNOSIS — R471 Dysarthria and anarthria: Secondary | ICD-10-CM | POA: Diagnosis present

## 2023-02-15 DIAGNOSIS — F1010 Alcohol abuse, uncomplicated: Secondary | ICD-10-CM | POA: Diagnosis present

## 2023-02-15 DIAGNOSIS — E512 Wernicke's encephalopathy: Secondary | ICD-10-CM | POA: Diagnosis present

## 2023-02-15 DIAGNOSIS — E43 Unspecified severe protein-calorie malnutrition: Secondary | ICD-10-CM | POA: Diagnosis not present

## 2023-02-15 DIAGNOSIS — M6282 Rhabdomyolysis: Secondary | ICD-10-CM | POA: Diagnosis present

## 2023-02-15 DIAGNOSIS — E871 Hypo-osmolality and hyponatremia: Secondary | ICD-10-CM | POA: Diagnosis not present

## 2023-02-15 DIAGNOSIS — R6883 Chills (without fever): Secondary | ICD-10-CM | POA: Diagnosis not present

## 2023-02-15 DIAGNOSIS — E872 Acidosis, unspecified: Secondary | ICD-10-CM | POA: Diagnosis not present

## 2023-02-15 DIAGNOSIS — D6489 Other specified anemias: Secondary | ICD-10-CM | POA: Diagnosis present

## 2023-02-15 DIAGNOSIS — Z87891 Personal history of nicotine dependence: Secondary | ICD-10-CM | POA: Diagnosis not present

## 2023-02-15 DIAGNOSIS — G312 Degeneration of nervous system due to alcohol: Secondary | ICD-10-CM | POA: Diagnosis present

## 2023-02-15 DIAGNOSIS — Z820 Family history of epilepsy and other diseases of the nervous system: Secondary | ICD-10-CM

## 2023-02-15 DIAGNOSIS — I959 Hypotension, unspecified: Secondary | ICD-10-CM | POA: Diagnosis not present

## 2023-02-15 DIAGNOSIS — W19XXXA Unspecified fall, initial encounter: Secondary | ICD-10-CM | POA: Diagnosis not present

## 2023-02-15 DIAGNOSIS — I700 Atherosclerosis of aorta: Secondary | ICD-10-CM | POA: Diagnosis not present

## 2023-02-15 DIAGNOSIS — K7031 Alcoholic cirrhosis of liver with ascites: Secondary | ICD-10-CM | POA: Diagnosis present

## 2023-02-15 DIAGNOSIS — L8990 Pressure ulcer of unspecified site, unspecified stage: Secondary | ICD-10-CM | POA: Insufficient documentation

## 2023-02-15 LAB — CBC
HCT: 36.1 % — ABNORMAL LOW (ref 39.0–52.0)
Hemoglobin: 12.9 g/dL — ABNORMAL LOW (ref 13.0–17.0)
MCH: 27.3 pg (ref 26.0–34.0)
MCHC: 35.7 g/dL (ref 30.0–36.0)
MCV: 76.5 fL — ABNORMAL LOW (ref 80.0–100.0)
Platelets: 98 10*3/uL — ABNORMAL LOW (ref 150–400)
RBC: 4.72 MIL/uL (ref 4.22–5.81)
RDW: 15.5 % (ref 11.5–15.5)
WBC: 4.4 10*3/uL (ref 4.0–10.5)
nRBC: 0 % (ref 0.0–0.2)

## 2023-02-15 LAB — BASIC METABOLIC PANEL
Anion gap: 16 — ABNORMAL HIGH (ref 5–15)
Anion gap: 10 (ref 5–15)
Anion gap: 15 (ref 5–15)
BUN: 7 mg/dL (ref 6–20)
BUN: 7 mg/dL (ref 6–20)
BUN: 8 mg/dL (ref 6–20)
CO2: 20 mmol/L — ABNORMAL LOW (ref 22–32)
CO2: 23 mmol/L (ref 22–32)
CO2: 19 mmol/L — ABNORMAL LOW (ref 22–32)
Calcium: 8.2 mg/dL — ABNORMAL LOW (ref 8.9–10.3)
Calcium: 8.2 mg/dL — ABNORMAL LOW (ref 8.9–10.3)
Calcium: 8.2 mg/dL — ABNORMAL LOW (ref 8.9–10.3)
Chloride: 88 mmol/L — ABNORMAL LOW (ref 98–111)
Chloride: 90 mmol/L — ABNORMAL LOW (ref 98–111)
Chloride: 90 mmol/L — ABNORMAL LOW (ref 98–111)
Creatinine, Ser: 0.71 mg/dL (ref 0.61–1.24)
Creatinine, Ser: 0.68 mg/dL (ref 0.61–1.24)
Creatinine, Ser: 0.64 mg/dL (ref 0.61–1.24)
GFR, Estimated: >60 mL/min (ref >60)
GFR, Estimated: >60 mL/min (ref >60)
GFR, Estimated: >60 mL/min (ref >60)
Glucose, Bld: 81 mg/dL (ref 70–99)
Glucose, Bld: 87 mg/dL (ref 70–99)
Glucose, Bld: 81 mg/dL (ref 70–99)
Potassium: 2.7 mmol/L — CL (ref 3.5–5.1)
Potassium: 3.9 mmol/L (ref 3.5–5.1)
Potassium: 3.2 mmol/L — ABNORMAL LOW (ref 3.5–5.1)
Sodium: 124 mmol/L — ABNORMAL LOW (ref 135–145)
Sodium: 123 mmol/L — ABNORMAL LOW (ref 135–145)
Sodium: 124 mmol/L — ABNORMAL LOW (ref 135–145)

## 2023-02-15 LAB — URINALYSIS, ROUTINE W REFLEX MICROSCOPIC
Bilirubin Urine: NEGATIVE
Glucose, UA: NEGATIVE mg/dL
Ketones, ur: 20 mg/dL — AB
Leukocytes,Ua: NEGATIVE
Nitrite: NEGATIVE
Protein, ur: NEGATIVE mg/dL
Specific Gravity, Urine: 1.006 (ref 1.005–1.030)
pH: 6 (ref 5.0–8.0)

## 2023-02-15 LAB — GLUCOSE, CAPILLARY
Glucose-Capillary: 128 mg/dL — ABNORMAL HIGH (ref 70–99)
Glucose-Capillary: 81 mg/dL (ref 70–99)
Glucose-Capillary: 83 mg/dL (ref 70–99)
Glucose-Capillary: 84 mg/dL (ref 70–99)
Glucose-Capillary: 93 mg/dL (ref 70–99)

## 2023-02-15 LAB — COMPREHENSIVE METABOLIC PANEL
ALT: 44 U/L (ref 0–44)
AST: 124 U/L — ABNORMAL HIGH (ref 15–41)
Albumin: 3.7 g/dL (ref 3.5–5.0)
Alkaline Phosphatase: 61 U/L (ref 38–126)
Anion gap: 18 — ABNORMAL HIGH (ref 5–15)
BUN: 11 mg/dL (ref 6–20)
CO2: 24 mmol/L (ref 22–32)
Calcium: 9.4 mg/dL (ref 8.9–10.3)
Chloride: 79 mmol/L — ABNORMAL LOW (ref 98–111)
Creatinine, Ser: 1.03 mg/dL (ref 0.61–1.24)
GFR, Estimated: >60 mL/min (ref >60)
Glucose, Bld: 101 mg/dL — ABNORMAL HIGH (ref 70–99)
Potassium: 4.2 mmol/L (ref 3.5–5.1)
Sodium: 121 mmol/L — ABNORMAL LOW (ref 135–145)
Total Bilirubin: 1.4 mg/dL — ABNORMAL HIGH (ref 0.3–1.2)
Total Protein: 9.5 g/dL — ABNORMAL HIGH (ref 6.5–8.1)

## 2023-02-15 LAB — I-STAT ARTERIAL BLOOD GAS, ED
Acid-Base Excess: 0 mmol/L (ref 0.0–2.0)
Bicarbonate: 21.7 mmol/L (ref 20.0–28.0)
Calcium, Ion: 1.04 mmol/L — ABNORMAL LOW (ref 1.15–1.40)
HCT: 35 % — ABNORMAL LOW (ref 39.0–52.0)
Hemoglobin: 11.9 g/dL — ABNORMAL LOW (ref 13.0–17.0)
O2 Saturation: 97 %
Patient temperature: 98.5
Potassium: 2.5 mmol/L — CL (ref 3.5–5.1)
Sodium: 123 mmol/L — ABNORMAL LOW (ref 135–145)
TCO2: 22 mmol/L (ref 22–32)
pCO2 arterial: 26.1 mmHg — ABNORMAL LOW (ref 32–48)
pH, Arterial: 7.527 — ABNORMAL HIGH (ref 7.35–7.45)
pO2, Arterial: 80 mmHg — ABNORMAL LOW (ref 83–108)

## 2023-02-15 LAB — LACTIC ACID, PLASMA
Lactic Acid, Venous: 4.6 mmol/L (ref 0.5–1.9)
Lactic Acid, Venous: 1.6 mmol/L (ref 0.5–1.9)
Lactic Acid, Venous: 1.8 mmol/L (ref 0.5–1.9)
Lactic Acid, Venous: 1.9 mmol/L (ref 0.5–1.9)

## 2023-02-15 LAB — PROTIME-INR
INR: 1.4 — ABNORMAL HIGH (ref 0.8–1.2)
Prothrombin Time: 16.7 seconds — ABNORMAL HIGH (ref 11.4–15.2)

## 2023-02-15 LAB — CBG MONITORING, ED: Glucose-Capillary: 88 mg/dL (ref 70–99)

## 2023-02-15 LAB — MAGNESIUM: Magnesium: 2.2 mg/dL (ref 1.7–2.4)

## 2023-02-15 LAB — MRSA NEXT GEN BY PCR, NASAL: MRSA by PCR Next Gen: NOT DETECTED

## 2023-02-15 LAB — PHOSPHORUS: Phosphorus: 3.2 mg/dL (ref 2.5–4.6)

## 2023-02-15 LAB — CK: Total CK: 1761 U/L — ABNORMAL HIGH (ref 49–397)

## 2023-02-15 LAB — ETHANOL: Alcohol, Ethyl (B): <10 mg/dL (ref <10)

## 2023-02-15 LAB — HIV ANTIBODY (ROUTINE TESTING W REFLEX): HIV Screen 4th Generation wRfx: NONREACTIVE

## 2023-02-15 LAB — AMMONIA: Ammonia: 32 umol/L (ref 9–35)

## 2023-02-15 MED ORDER — POTASSIUM CHLORIDE 10 MEQ/100ML IV SOLN
10.0000 meq | INTRAVENOUS | Status: DC
  Administered 2023-02-15 (×4): 10 meq via INTRAVENOUS
  Filled 2023-02-15 (×4): qty 100

## 2023-02-15 MED ORDER — SODIUM CHLORIDE 0.9 % IV SOLN
2.0000 g | Freq: Three times a day (TID) | INTRAVENOUS | Status: DC
  Administered 2023-02-15 – 2023-02-16 (×4): 2 g via INTRAVENOUS
  Filled 2023-02-15 (×4): qty 12.5

## 2023-02-15 MED ORDER — LORAZEPAM 2 MG/ML IJ SOLN
1.0000 mg | INTRAMUSCULAR | Status: AC | PRN
  Administered 2023-02-15: 1 mg via INTRAVENOUS
  Administered 2023-02-15: 2 mg via INTRAVENOUS
  Administered 2023-02-15: 4 mg via INTRAVENOUS
  Administered 2023-02-15 (×3): 2 mg via INTRAVENOUS
  Administered 2023-02-16: 1 mg via INTRAVENOUS
  Filled 2023-02-15 (×2): qty 1
  Filled 2023-02-15: qty 2
  Filled 2023-02-15 (×2): qty 1
  Filled 2023-02-15: qty 2

## 2023-02-15 MED ORDER — HEPARIN SODIUM (PORCINE) 5000 UNIT/ML IJ SOLN
5000.0000 [IU] | Freq: Three times a day (TID) | INTRAMUSCULAR | Status: DC
  Administered 2023-02-15 – 2023-02-20 (×17): 5000 [IU] via SUBCUTANEOUS
  Filled 2023-02-15 (×16): qty 1

## 2023-02-15 MED ORDER — LACTATED RINGERS IV BOLUS
1000.0000 mL | Freq: Once | INTRAVENOUS | Status: AC
  Administered 2023-02-15: 1000 mL via INTRAVENOUS

## 2023-02-15 MED ORDER — LORAZEPAM 1 MG PO TABS: 1.0000 mg | ORAL_TABLET | ORAL | Status: AC | PRN

## 2023-02-15 MED ORDER — THIAMINE HCL 100 MG/ML IJ SOLN
100.0000 mg | Freq: Every day | INTRAMUSCULAR | Status: DC
  Administered 2023-02-15: 100 mg via INTRAVENOUS
  Filled 2023-02-15: qty 2

## 2023-02-15 MED ORDER — FOLIC ACID 1 MG PO TABS: 1.0000 mg | ORAL_TABLET | Freq: Every day | ORAL | Status: DC

## 2023-02-15 MED ORDER — DOCUSATE SODIUM 100 MG PO CAPS: 100.0000 mg | ORAL_CAPSULE | Freq: Two times a day (BID) | ORAL | Status: DC | PRN

## 2023-02-15 MED ORDER — VANCOMYCIN HCL IN DEXTROSE 1-5 GM/200ML-% IV SOLN
1000.0000 mg | Freq: Two times a day (BID) | INTRAVENOUS | Status: DC
  Administered 2023-02-15: 1000 mg via INTRAVENOUS
  Filled 2023-02-15 (×2): qty 200

## 2023-02-15 MED ORDER — PHENOBARBITAL SODIUM 65 MG/ML IJ SOLN: 32.5000 mg | Freq: Three times a day (TID) | INTRAMUSCULAR | Status: DC

## 2023-02-15 MED ORDER — SODIUM CHLORIDE 0.9 % IV SOLN
2.0000 g | Freq: Once | INTRAVENOUS | Status: AC
  Administered 2023-02-15: 2 g via INTRAVENOUS
  Filled 2023-02-15: qty 20

## 2023-02-15 MED ORDER — PHENOBARBITAL SODIUM 130 MG/ML IJ SOLN
97.5000 mg | Freq: Three times a day (TID) | INTRAMUSCULAR | Status: DC
  Administered 2023-02-15 – 2023-02-16 (×3): 97.5 mg via INTRAVENOUS
  Filled 2023-02-15 (×3): qty 1

## 2023-02-15 MED ORDER — MIDAZOLAM HCL 2 MG/2ML IJ SOLN
2.0000 mg | Freq: Once | INTRAMUSCULAR | Status: AC
  Administered 2023-02-15: 2 mg via INTRAVENOUS
  Filled 2023-02-15: qty 2

## 2023-02-15 MED ORDER — SODIUM CHLORIDE 0.9 % IV BOLUS
1000.0000 mL | Freq: Once | INTRAVENOUS | Status: AC
  Administered 2023-02-15: 1000 mL via INTRAVENOUS

## 2023-02-15 MED ORDER — THIAMINE HCL 100 MG/ML IJ SOLN
500.0000 mg | Freq: Three times a day (TID) | INTRAVENOUS | Status: AC
  Administered 2023-02-15 – 2023-02-18 (×9): 500 mg via INTRAVENOUS
  Filled 2023-02-15 (×10): qty 5

## 2023-02-15 MED ORDER — POLYETHYLENE GLYCOL 3350 17 G PO PACK: 17.0000 g | PACK | Freq: Every day | ORAL | Status: DC | PRN

## 2023-02-15 MED ORDER — MAGNESIUM SULFATE 2 GM/50ML IV SOLN
2.0000 g | Freq: Once | INTRAVENOUS | Status: AC
  Administered 2023-02-15: 2 g via INTRAVENOUS
  Filled 2023-02-15: qty 50

## 2023-02-15 MED ORDER — POTASSIUM CHLORIDE 10 MEQ/100ML IV SOLN
10.0000 meq | INTRAVENOUS | Status: AC
  Administered 2023-02-16 (×5): 10 meq via INTRAVENOUS
  Filled 2023-02-15: qty 100

## 2023-02-15 MED ORDER — SODIUM CHLORIDE 0.9 % IV SOLN: INTRAVENOUS | Status: DC

## 2023-02-15 MED ORDER — CHLORHEXIDINE GLUCONATE CLOTH 2 % EX PADS
6.0000 | MEDICATED_PAD | Freq: Every day | CUTANEOUS | Status: DC
  Administered 2023-02-15 – 2023-02-20 (×6): 6 via TOPICAL

## 2023-02-15 MED ORDER — CALCIUM GLUCONATE-NACL 1-0.675 GM/50ML-% IV SOLN
1.0000 g | Freq: Once | INTRAVENOUS | Status: AC
  Administered 2023-02-15: 1000 mg via INTRAVENOUS
  Filled 2023-02-15: qty 50

## 2023-02-15 MED ORDER — PHENOBARBITAL SODIUM 65 MG/ML IJ SOLN: 65.0000 mg | Freq: Three times a day (TID) | INTRAMUSCULAR | Status: DC

## 2023-02-15 MED ORDER — ADULT MULTIVITAMIN W/MINERALS CH: 1.0000 | ORAL_TABLET | Freq: Every day | ORAL | Status: DC

## 2023-02-15 MED ORDER — VANCOMYCIN HCL 1500 MG/300ML IV SOLN
1500.0000 mg | Freq: Once | INTRAVENOUS | Status: AC
  Administered 2023-02-15: 1500 mg via INTRAVENOUS
  Filled 2023-02-15: qty 300

## 2023-02-15 MED ORDER — THIAMINE MONONITRATE 100 MG PO TABS: 100.0000 mg | ORAL_TABLET | Freq: Every day | ORAL | Status: DC

## 2023-02-15 MED ORDER — SODIUM CHLORIDE 0.9 % IV SOLN
260.0000 mg | Freq: Once | INTRAVENOUS | Status: AC
  Administered 2023-02-15: 260 mg via INTRAVENOUS
  Filled 2023-02-15: qty 2

## 2023-02-15 NOTE — Progress Notes (Addendum)
eLink Physician-Brief Progress Note Patient Name: Stephen Carroll DOB: [birth year] MRN: [id number]   Date of Service  02/15/2023  HPI/Events of Note  57 year old male with a history of alcohol use disorder, alcohol withdrawal seizures, cirrhosis -came in with rhabdomyolysis, encephalopathy, cellulitis in the setting of fall.  Undergoing potassium repletion, last check 3.9 at [place].  Scheduled to undergo 40 more mEq replacement.  eICU Interventions  Hold remaining potassium replacement until recheck.   2309 - K 3.2 -> 60 MEQ ordered    Intervention Category Minor Interventions: Electrolytes abnormality - evaluation and management  [name] 02/15/2023, 10:34 PM

## 2023-02-15 NOTE — Progress Notes (Signed)
Pharmacy Antibiotic Note  Stephen Carroll is a 57 y.o. male admitted on 02/15/2023 with AMS, lactic acidosis, chronic hyponatremia worsening over the past 24h. Lactic acid was elevated at 4.6. Unclear if this may be due to an occult infection/bacteremia. Pharmacy has been consulted for Cefepime and Vancomycin dosing.  Pt received Ceftriaxone 2g IV x1 on 3/7 AM in ED  WBC increased from 2.7 to 4.4 Temperature 91.7 on presentation has improved to 98.5 Scr increased from 0.57 to 1.03 (baseline SCr ~0.5) Lactate 4.6 CK 1761  Plan: Initiate Cefepime 2g IV q8h Initiate loading dose of Vancomycin '1500mg'$  IV x 1, followed by  Vancomycin '1000mg'$  IV q12h (eAUC ~539)    > Goal AUC 400-550    > Check vancomycin levels at steady state  Monitor daily CBC, temp, SCr, and for clinical signs of improvement  F/u cultures and de-escalate antibiotics as able   Height: '5\' 8"'$  (172.7 cm) Weight: 74.8 kg (165 lb) IBW/kg (Calculated) : 68.4  Temp (24hrs), Avg:95.8 F (35.4 C), Min:91.7 F (33.2 C), Max:98.5 F (36.9 C)  Recent Labs  Lab 02/13/23 1246 02/15/23 0320  WBC 2.7* 4.4  CREATININE 0.57* 1.03  LATICACIDVEN  --  4.6*    Estimated Creatinine Clearance: 78.4 mL/min (by C-G formula based on SCr of 1.03 mg/dL).    No Known Allergies  Antimicrobials this admission: Ceftriaxone 3/7  Cefepime 3/7 >>  Vancomycin 3/7 >>   Dose adjustments this admission: N/A  Microbiology results: 3/7 BCx: pending   Thank you for allowing pharmacy to be a part of this patient's care.  Luisa Hart, PharmD, BCPS Clinical Pharmacist 02/15/2023 9:11 AM   Please refer to AMION for pharmacy phone number

## 2023-02-15 NOTE — Procedures (Signed)
Patient Name: Stephen Carroll  MRN: [id number]  Epilepsy Attending: Lora Havens  Referring Physician/Provider: Kerney Elbe, MD  Date: 02/15/2023 Duration: 26.01 mins  Patient history: 57yo M with ams. EEG to evaluate for seizure  Level of alertness: Awake, asleep  AEDs during EEG study: Phenobarb, Ativan  Technical aspects: This EEG study was done with scalp electrodes positioned according to the 10-20 International system of electrode placement. Electrical activity was reviewed with band pass filter of 1-'70Hz'$ , sensitivity of 7 uV/mm, display speed of 19m/sec with a '60Hz'$  notched filter applied as appropriate. EEG data were recorded continuously and digitally stored.  Video monitoring was available and reviewed as appropriate.  Description: No clear posterior dominant rhythm was seen. Sleep was characterized by vertex waves, sleep spindles (12 to 14 Hz), maximal frontocentral region.  There is an excessive amount of 15 to 18 Hz beta activity distributed symmetrically and diffusely. Hyperventilation and photic stimulation were not performed.     ABNORMALITY - Excessive beta, generalized  IMPRESSION: This study is within normal limits. The excessive beta activity seen in the background is most likely due to the effect of benzodiazepine and is a benign EEG pattern. No seizures or epileptiform discharges were seen throughout the recording.  Lydie Stammen OBarbra Sarks
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
## 2023-02-15 NOTE — Progress Notes (Signed)
STAT EEG complete - results pending. ? ?

## 2023-02-15 NOTE — ED Provider Notes (Signed)
  Physical Exam  BP (!) 144/79   Pulse 100   Temp 98.5 F (36.9 C) (Axillary)   Resp (!) 21   Ht '5\' 8"'$  (1.727 m)   Wt 74.8 kg   SpO2 99%   BMI 25.09 kg/m   Physical Exam  Procedures  Procedures  ED Course / MDM    Medical Decision Making Amount and/or Complexity of Data Reviewed Labs: ordered. Radiology: ordered. ECG/medicine tests: ordered.  Risk OTC drugs. Prescription drug management. Decision regarding hospitalization.  Patient brought in after fall.  Received in signout.  However mental status decreased.  History of alcohol use disorder reportedly last drink around 24 hours ago.  Now mental status is decreased.  Mostly nonverbal at this time.  Trying to speak.  Tremulous.  Has been receiving Ativan.  Head CT independently interpreted and reassuring.  However with mental status change will discuss with ICU.  Reportedly had fever yesterday.  White count not normal today.  Lactic acid elevated 4.6.  Antibiotics have been given, ammonia reassuring.  Also hyponatremia down to 121.  Will discuss with ICU for admission. Also will discuss with neurology.    CRITICAL CARE Performed by: Davonna Belling Total critical care time: 30 minutes Critical care time was exclusive of separately billable procedures and treating other patients. Critical care was necessary to treat or prevent imminent or life-threatening deterioration. Critical care was time spent personally by me on the following activities: development of treatment plan with patient and/or surrogate as well as nursing, discussions with consultants, evaluation of patient's response to treatment, examination of patient, obtaining history from patient or surrogate, ordering and performing treatments and interventions, ordering and review of laboratory studies, ordering and review of radiographic studies, pulse oximetry and re-evaluation of patient's condition.        Davonna Belling, MD 02/16/23 1452

## 2023-02-15 NOTE — ED Notes (Signed)
CIWA Score 20. Critical care team informed
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
## 2023-02-15 NOTE — Progress Notes (Signed)
SLP Cancellation Note  Patient Details Name: Stephen Carroll MRN: [id number] DOB: May 14, 1966   Cancelled treatment:       Reason Eval/Treat Not Completed: Patient's level of consciousness. No alert per RN. Will f/u   Ramiah Helfrich, Katherene Ponto 02/15/2023, 12:59 PM

## 2023-02-15 NOTE — ED Notes (Signed)
Date and time results received: 02/15/23 0402 (use smartphrase ".now" to insert current time)  Test: LACTIC ACID Critical Value: 4.2  Name of Provider Notified: BERO  Orders Received? Or Actions Taken?:

## 2023-02-15 NOTE — TOC Initial Note (Signed)
Transition of Care [facility]) - Initial/Assessment Note    Patient Details  Name: Stephen Carroll MRN: [id number] Date of Birth: 23-Feb-1966  [facility]) CM/SW Contact:    Ninfa Meeker, RN Phone Number: 02/15/2023, 1:50 PM  Clinical Narrative:                    Transition of Care (TOC) Department has reviewed patient and no TOC needs have been identified at this time. We will continue to monitor patient advancement through Interdisciplinary progressions and if new patient needs arise, please place a consult.     Patient Goals and CMS Choice            Expected Discharge Plan and Services                                              Prior Living Arrangements/Services                       Activities of Daily Living      Permission Sought/Granted                  Emotional Assessment              Admission diagnosis:  Alcohol withdrawal syndrome with complication ([place]) [id number] AMS (altered mental status) [R41.82] Patient Active Problem List   Diagnosis Date Noted   AMS (altered mental status) 02/15/2023   Normocytic anemia    Chronic hyponatremia    Cellulitis 03/15/2022   Osteomyelitis of left foot (HCC)    Pain due to onychomycosis of toenails of both feet 01/14/2020   Hav (hallux abducto valgus), unspecified laterality 01/14/2020   Hammer toes, bilateral 01/14/2020   Corns and callosities 01/14/2020   Pancytopenia ([place]) 09/18/2019   Herpes zoster without complication [id number]   Tobacco use 07/01/2017   Marijuana use 07/01/2017   Low back pain 05/29/2017   Rib contusion, left, subsequent encounter [id number]   Alcoholic cirrhosis of liver without ascites ([place])    Alcohol withdrawal seizure ([place])    Aneurysm, cerebral, nonruptured 08/09/2015   Alcohol withdrawal ([place]) 08/07/2015   ETOH abuse 08/07/2015   PCP:  Rise Patience, DO Pharmacy:   [id number]: [phone]: [phone]    Social Determinants of Health (SDOH) Social History: SDOH Screenings   Tobacco Use: Medium Risk (02/15/2023)   SDOH Interventions:     Readmission Risk Interventions     No data to display

## 2023-02-16 ENCOUNTER — Inpatient Hospital Stay (HOSPITAL_COMMUNITY): Payer: Medicaid Other

## 2023-02-16 DIAGNOSIS — E43 Unspecified severe protein-calorie malnutrition: Secondary | ICD-10-CM | POA: Insufficient documentation

## 2023-02-16 DIAGNOSIS — F10939 Alcohol use, unspecified with withdrawal, unspecified: Secondary | ICD-10-CM | POA: Diagnosis not present

## 2023-02-16 DIAGNOSIS — G9341 Metabolic encephalopathy: Secondary | ICD-10-CM | POA: Diagnosis not present

## 2023-02-16 DIAGNOSIS — E871 Hypo-osmolality and hyponatremia: Secondary | ICD-10-CM | POA: Diagnosis not present

## 2023-02-16 LAB — CBC WITH DIFFERENTIAL/PLATELET
Abs Immature Granulocytes: 0.01 10*3/uL (ref 0.00–0.07)
Basophils Absolute: 0 10*3/uL (ref 0.0–0.1)
Basophils Relative: 1 %
Eosinophils Absolute: 0 10*3/uL (ref 0.0–0.5)
Eosinophils Relative: 0 %
HCT: 32.3 % — ABNORMAL LOW (ref 39.0–52.0)
Hemoglobin: 11 g/dL — ABNORMAL LOW (ref 13.0–17.0)
Immature Granulocytes: 0 %
Lymphocytes Relative: 11 %
Lymphs Abs: 0.6 10*3/uL — ABNORMAL LOW (ref 0.7–4.0)
MCH: 27.1 pg (ref 26.0–34.0)
MCHC: 34.1 g/dL (ref 30.0–36.0)
MCV: 79.6 fL — ABNORMAL LOW (ref 80.0–100.0)
Monocytes Absolute: 0.7 10*3/uL (ref 0.1–1.0)
Monocytes Relative: 13 %
Neutro Abs: 4.1 10*3/uL (ref 1.7–7.7)
Neutrophils Relative %: 75 %
Platelets: 80 10*3/uL — ABNORMAL LOW (ref 150–400)
RBC: 4.06 MIL/uL — ABNORMAL LOW (ref 4.22–5.81)
RDW: 16 % — ABNORMAL HIGH (ref 11.5–15.5)
WBC: 5.5 10*3/uL (ref 4.0–10.5)
nRBC: 0 % (ref 0.0–0.2)

## 2023-02-16 LAB — MAGNESIUM
Magnesium: 1.7 mg/dL (ref 1.7–2.4)
Magnesium: 2.2 mg/dL (ref 1.7–2.4)

## 2023-02-16 LAB — BASIC METABOLIC PANEL
Anion gap: 15 (ref 5–15)
Anion gap: 13 (ref 5–15)
BUN: 6 mg/dL (ref 6–20)
BUN: 7 mg/dL (ref 6–20)
CO2: 20 mmol/L — ABNORMAL LOW (ref 22–32)
CO2: 20 mmol/L — ABNORMAL LOW (ref 22–32)
Calcium: 8.3 mg/dL — ABNORMAL LOW (ref 8.9–10.3)
Calcium: 8.1 mg/dL — ABNORMAL LOW (ref 8.9–10.3)
Chloride: 90 mmol/L — ABNORMAL LOW (ref 98–111)
Chloride: 90 mmol/L — ABNORMAL LOW (ref 98–111)
Creatinine, Ser: 0.67 mg/dL (ref 0.61–1.24)
Creatinine, Ser: 0.66 mg/dL (ref 0.61–1.24)
GFR, Estimated: >60 mL/min (ref >60)
GFR, Estimated: >60 mL/min (ref >60)
Glucose, Bld: 84 mg/dL (ref 70–99)
Glucose, Bld: 127 mg/dL — ABNORMAL HIGH (ref 70–99)
Potassium: 3.6 mmol/L (ref 3.5–5.1)
Potassium: 2.9 mmol/L — ABNORMAL LOW (ref 3.5–5.1)
Sodium: 125 mmol/L — ABNORMAL LOW (ref 135–145)
Sodium: 123 mmol/L — ABNORMAL LOW (ref 135–145)

## 2023-02-16 LAB — PHOSPHORUS: Phosphorus: 1.1 mg/dL — ABNORMAL LOW (ref 2.5–4.6)

## 2023-02-16 LAB — GLUCOSE, CAPILLARY
Glucose-Capillary: 90 mg/dL (ref 70–99)
Glucose-Capillary: 74 mg/dL (ref 70–99)
Glucose-Capillary: 132 mg/dL — ABNORMAL HIGH (ref 70–99)
Glucose-Capillary: 130 mg/dL — ABNORMAL HIGH (ref 70–99)

## 2023-02-16 LAB — CK: Total CK: 885 U/L — ABNORMAL HIGH (ref 49–397)

## 2023-02-16 MED ORDER — DOCUSATE SODIUM 50 MG/5ML PO LIQD: 100.0000 mg | Freq: Two times a day (BID) | ORAL | Status: DC | PRN

## 2023-02-16 MED ORDER — ADULT MULTIVITAMIN W/MINERALS CH
1.0000 | ORAL_TABLET | Freq: Every day | ORAL | Status: DC
  Administered 2023-02-16 – 2023-02-17 (×2): 1
  Filled 2023-02-16 (×3): qty 1

## 2023-02-16 MED ORDER — PHENOBARBITAL 32.4 MG PO TABS: 32.4000 mg | ORAL_TABLET | Freq: Three times a day (TID) | ORAL | Status: DC

## 2023-02-16 MED ORDER — OSMOLITE 1.5 CAL PO LIQD
1000.0000 mL | ORAL | Status: DC
  Administered 2023-02-16 – 2023-02-17 (×2): 1000 mL
  Filled 2023-02-16 (×4): qty 1000

## 2023-02-16 MED ORDER — FOLIC ACID 1 MG PO TABS
1.0000 mg | ORAL_TABLET | Freq: Every day | ORAL | Status: DC
  Administered 2023-02-16 – 2023-02-17 (×2): 1 mg
  Filled 2023-02-16 (×3): qty 1

## 2023-02-16 MED ORDER — POLYETHYLENE GLYCOL 3350 17 G PO PACK: 17.0000 g | PACK | Freq: Every day | ORAL | Status: DC | PRN

## 2023-02-16 MED ORDER — PHENOBARBITAL 32.4 MG PO TABS
97.2000 mg | ORAL_TABLET | Freq: Three times a day (TID) | ORAL | Status: AC
  Administered 2023-02-16 – 2023-02-17 (×3): 97.2 mg
  Filled 2023-02-16 (×3): qty 3

## 2023-02-16 MED ORDER — MAGNESIUM SULFATE 2 GM/50ML IV SOLN
2.0000 g | Freq: Once | INTRAVENOUS | Status: AC
  Administered 2023-02-16: 2 g via INTRAVENOUS
  Filled 2023-02-16: qty 50

## 2023-02-16 MED ORDER — PHENOBARBITAL 32.4 MG PO TABS: 64.8000 mg | ORAL_TABLET | Freq: Three times a day (TID) | ORAL | Status: DC

## 2023-02-16 MED ORDER — PROSOURCE TF20 ENFIT COMPATIBL EN LIQD
60.0000 mL | Freq: Every day | ENTERAL | Status: DC
  Administered 2023-02-16 – 2023-02-20 (×4): 60 mL
  Filled 2023-02-16 (×4): qty 60
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
## 2023-02-16 NOTE — Progress Notes (Signed)
Initial Nutrition Assessment  DOCUMENTATION CODES:  Severe malnutrition in context of social or environmental circumstances  INTERVENTION:  Continue diet as tolerated Initiate tube feeding via cortrak tube: Osmolite 1.5 at 50 ml/h (1200 ml per day) Prosource TF20 60 ml 1x/d Provides 1880 kcal, 95 gm protein, 914 ml free water daily MVI with minerals daily. Continue thiamine and add folic acid for hx of EtOH abuse  NUTRITION DIAGNOSIS:  Severe Malnutrition related to social / environmental circumstances (EtOH abuse) as evidenced by severe fat depletion, severe muscle depletion.  GOAL:  Patient will meet greater than or equal to 90% of their needs  MONITOR:  PO intake, TF tolerance, Supplement acceptance, Labs  REASON FOR ASSESSMENT:  Consult Enteral/tube feeding initiation and management  ASSESSMENT:  Pt with hx of EtOH abuse and cirrhosis presented to ED after a fall where he struck his head. Begin to show signs of EtOH withdrawal.  3/8 - cortrak placed (distal stomach)  Pt resting in bedside chair at the time of assessment. Pt conversant but confused and with visible tremors. Pt states he has a great appetite now and prior to admission. Pt reports that he weighs between 180-190lbs but on exam is extremely thin with significant loss of muscle and fat stores. Most recent weight is ~138 lbs. Pt states that he is ready to go home and that he is just waiting for his discharge currently.   Pt had cortrak placed this AM as he was quite lethargic, but has now become more responsive and was able to have diet advanced. Discussed in rounds. MD ok with still initiating TF as pt is quite malnourished. Will monitor PO intake and mental status to ensure that nutrition can be met orally before feeds are discontinued.     Intake/Output Summary (Last 24 hours) at 02/16/2023 1248 Last data filed at 02/16/2023 1100 Gross per 24 hour  Intake 2561.19 ml  Output 1525 ml  Net 1036.19 ml  Net IO Since  Admission: 4,277.67 mL [02/16/23 1248]  Nutritionally Relevant Medications: Scheduled Meds:  folic acid  1 mg Oral Daily   multivitamin with minerals  1 tablet Oral Daily   PHENObarbital  97.5 mg Intravenous Q8H   Continuous Infusions:  sodium chloride 50 mL/hr at 02/16/23 0600   ceFEPime (MAXIPIME) IV Stopped (02/16/23 0152)   thiamine (VITAMIN B1) injection Stopped (02/16/23 0043)   vancomycin Stopped (02/15/23 2347)   PRN Meds: docusate sodium, polyethylene glycol  Labs Reviewed: Na 125, chloride 90 CBG ranges from 74-128 mg/dL over the last 24 hours  NUTRITION - FOCUSED PHYSICAL EXAM: Flowsheet Row Most Recent Value  Orbital Region Severe depletion  Upper Arm Region Severe depletion  Thoracic and Lumbar Region Severe depletion  Buccal Region Severe depletion  Temple Region Severe depletion  Clavicle Bone Region Moderate depletion  Clavicle and Acromion Bone Region Severe depletion  Scapular Bone Region Moderate depletion  Dorsal Hand Severe depletion  Patellar Region Severe depletion  Anterior Thigh Region Severe depletion  Posterior Calf Region Severe depletion  Edema (RD Assessment) None  Hair Reviewed  Eyes Reviewed  Mouth Reviewed  Skin Reviewed  Nails Reviewed   Diet Order:   Diet Order             Diet regular Room service appropriate? Yes; Fluid consistency: Thin  Diet effective now                   EDUCATION NEEDS:  Not appropriate for education at this time  Skin:  Skin Assessment: Reviewed RN Assessment Stage 1: Medial sacrum (3cm x 3 cm)  Last BM:  3/7 - type 5  Height:  Ht Readings from Last 1 Encounters:  02/15/23 '5\' 8"'$  (1.727 m)    Weight:  Wt Readings from Last 1 Encounters:  02/16/23 62.4 kg    Ideal Body Weight:  70 kg  BMI:  Body mass index is 20.92 kg/m.  Estimated Nutritional Needs:  Kcal:  1800-2100 kcal/d Protein:  90-105 g/d Fluid:  1.8-2L/d   Ranell Patrick, RD, LDN Clinical Dietitian RD pager #  available in AM[facility]  After hours/weekend pager # available in [facility]
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
## 2023-02-16 NOTE — Evaluation (Signed)
Physical Therapy Evaluation Patient Details Name: Stephen Carroll MRN: [id number] DOB: March 01, 1966 Today's Date: 02/16/2023  History of Present Illness  Pt is 57 year old presented to [facility] on  02/15/23 after a fall, lactic acidosis, AMS. Suspected ETOH withdrawals with DT's. PMH - EtOH abuse, cerebral aneurysm, alcohol withdrawal seizures and alcoholic cirrhosis with ascites, toe amputation  Clinical Impression  Pt currently presents to PT with decr mobility due to decr balance, poor cognition, and general weakness. Expect this is primarily due to withdrawals and will improve significantly. Hopefully will return to baseline with end of withdrawals.       Recommendations for follow up therapy are one component of a multi-disciplinary discharge planning process, led by the attending physician.  Recommendations may be updated based on patient status, additional functional criteria and insurance authorization.  Follow Up Recommendations Home health PT (likely will progress and not need)      Assistance Recommended at Discharge Intermittent Supervision/Assistance  Patient can return home with the following  Help with stairs or ramp for entrance    Equipment Recommendations None recommended by PT  Recommendations for Other Services       Functional Status Assessment Patient has had a recent decline in their functional status and demonstrates the ability to make significant improvements in function in a reasonable and predictable amount of time.     Precautions / Restrictions Precautions Precautions: Fall;Other (comment) Precaution Comments: watch HR Restrictions Weight Bearing Restrictions: No      Mobility  Bed Mobility Overal bed mobility: Needs Assistance Bed Mobility: Supine to Sit     Supine to sit: Min assist, HOB elevated     General bed mobility comments: Pt with difficulty processing and staying on task to complete. Multiple verbal/tactile cues as well as light min assist  to complete.    Transfers Overall transfer level: Needs assistance Equipment used: 2 person hand held assist Transfers: Sit to/from Stand Sit to Stand: +2 physical assistance, Min assist           General transfer comment: Assist to bring hips up and for balance. Posterior bias.    Ambulation/Gait Ambulation/Gait assistance: +2 physical assistance, Min assist Gait Distance (Feet): 5 Feet (forward/backward) Assistive device: 2 person hand held assist Gait Pattern/deviations: Step-to pattern, Decreased step length - right, Decreased step length - left, Wide base of support, Shuffle, Ataxic Gait velocity: decr Gait velocity interpretation: <1.31 ft/sec, indicative of household ambulator   General Gait Details: Assist for balance and support and verbal cues to incr step length  Stairs            Wheelchair Mobility    Modified Rankin (Stroke Patients Only)       Balance Overall balance assessment: Needs assistance Sitting-balance support: No upper extremity supported, Feet supported Sitting balance-Leahy Scale: Fair     Standing balance support: Bilateral upper extremity supported Standing balance-Leahy Scale: Poor Standing balance comment: UE support and min assist for static standing                             Pertinent Vitals/Pain Pain Assessment Pain Assessment: No/denies pain    Home Living Family/patient expects to be discharged to:: Private residence Living Arrangements: Other relatives (sister) Available Help at Discharge: Family;Available PRN/intermittently Type of Home: House Home Access: Stairs to enter Entrance Stairs-Rails: Right;Left;Can reach both Entrance Stairs-Number of Steps: 3   Home Layout: One level Home Equipment: Conservation officer, nature (2 wheels);Cane -  single point;Grab bars - tub/shower;Hand held shower head      Prior Function Prior Level of Function : Independent/Modified Independent             Mobility Comments:  Has walker and cane but doesn't use ADLs Comments: Works as a caddy on the side, 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  Is Independent with all ADLs     Hand Dominance        Extremity/Trunk Assessment   Upper Extremity Assessment Upper Extremity Assessment: Defer to OT evaluation    Lower Extremity Assessment Lower Extremity Assessment: Generalized weakness       Communication   Communication: Expressive difficulties  Cognition Arousal/Alertness: Awake/alert Behavior During Therapy: Flat affect Overall Cognitive Status: Impaired/Different from baseline Area of Impairment: Orientation, Attention, Memory, Following commands, Safety/judgement, Awareness, Problem solving                 Orientation Level: Disoriented to, Place, Time, Situation Current Attention Level: Sustained Memory: Decreased recall of precautions, Decreased short-term memory Following Commands: Follows one step commands with increased time Safety/Judgement: Decreased awareness of safety, Decreased awareness of deficits Awareness: Intellectual Problem Solving: Slow processing, Decreased initiation, Difficulty sequencing, Requires verbal cues, Requires tactile cues          General Comments General comments (skin integrity, edema, etc.): RHR 120 and HR with activity 160.    Exercises     Assessment/Plan    PT Assessment Patient needs continued PT services  PT Problem List Decreased strength;Decreased balance;Decreased mobility;Cardiopulmonary status limiting activity;Decreased cognition;Decreased activity tolerance       PT Treatment Interventions DME instruction;Gait training;Stair training;Functional mobility training;Therapeutic exercise;Therapeutic activities;Balance training;Cognitive remediation;Patient/family education    PT Goals (Current goals can be found in the Care Plan section)  Acute Rehab PT Goals Patient Stated Goal: go home PT Goal Formulation: With patient Time For Goal  Achievement: 03/02/23 Potential to Achieve Goals: Good    Frequency Min 3X/week     Co-evaluation PT/OT/SLP Co-Evaluation/Treatment: Yes Reason for Co-Treatment: For patient/therapist safety;Necessary to address cognition/behavior during functional activity PT goals addressed during session: Mobility/safety with mobility;Balance         AM-PAC PT "6 Clicks" Mobility  Outcome Measure Help needed turning from your back to your side while in a flat bed without using bedrails?: A Little Help needed moving from lying on your back to sitting on the side of a flat bed without using bedrails?: A Little Help needed moving to and from a bed to a chair (including a wheelchair)?: Total Help needed standing up from a chair using your arms (e.g., wheelchair or bedside chair)?: A Little Help needed to walk in hospital room?: Total Help needed climbing 3-5 steps with a railing? : Total 6 Click Score: 12    End of Session   Activity Tolerance: Treatment limited secondary to medical complications (Comment) (high HR) Patient left: in chair;with call bell/phone within reach;with chair alarm set Nurse Communication: Mobility status PT Visit Diagnosis: Unsteadiness on feet (R26.81);Other abnormalities of gait and mobility (R26.89);Ataxic gait (R26.0)    Time: [id number] PT Time Calculation (min) (ACUTE ONLY): 24 min   Charges:   PT Evaluation $PT Eval Moderate Complexity: 1 [place] [facility] 02/16/2023, 9:48 AM

## 2023-02-16 NOTE — Procedures (Signed)
Cortrak  Person Inserting Tube:  Garrin Kirwan C, RD Tube Type:  Cortrak - 43 inches Tube Size:  10 Tube Location:  Left nare Secured by: Bridle Technique Used to Measure Tube Placement:  Marking at nare/corner of mouth Cortrak Secured At:  65 cm   Cortrak Tube Team Note:  Consult received to place a Cortrak feeding tube.   X-ray is required, abdominal x-ray has been ordered by the Cortrak team. Please confirm tube placement before using the Cortrak tube.   If the tube becomes dislodged please keep the tube and contact the Cortrak team at [email or web address] for replacement.  If after hours and replacement cannot be delayed, place a NG tube and confirm placement with an abdominal x-ray.    Rogue Rafalski P., RD, LDN, CNSC See AMiON for contact information    

## 2023-02-16 NOTE — Progress Notes (Addendum)
Subjective: Patient says he feels fine. Of his tremors, he says he has had it for "a long time." He says he is going to go out and play golf in April. Identifies year as "72 or 52" and says it is [place]. He says he was admitted to the hospital for "my attitude." Denies any recent loss of consciousness overnight.  Objective: Current vital signs: BP (!) 146/86   Pulse (!) 134   Temp 99.7 F (37.6 C) (Axillary)   Resp (!) 25   Ht '5\' 8"'$  (1.727 m)   Wt 62.4 kg   SpO2 95%   BMI 20.92 kg/m  Vital signs in last 24 hours: Temp:  [98.4 F (36.9 C)-99.7 F (37.6 C)] 99.7 F (37.6 C) (03/08 0845) Pulse Rate:  [71-134] 134 (03/08 0915) Resp:  [16-26] 25 (03/08 0915) BP: (89-156)/(54-100) 146/86 (03/08 0830) SpO2:  [86 %-100 %] 95 % (03/08 0915) Weight:  [62.4 kg] 62.4 kg (03/08 0430)  Intake/Output from previous day: 03/07 0701 - 03/08 0700 In: 4710.2 [I.V.:789.9; IV Piggyback:3920.4] Out: [id number] [Urine:1825] Intake/Output this shift: Total I/O In: 293.5 [I.V.:118.2; IV Piggyback:175.3] Out: -  Nutritional status:  Diet Order             Diet NPO time specified  Diet effective now                   Neurologic Exam: Mental Status: Stephen Carroll is alert; he is oriented to self and oriented to place.  Identifies year as "37 or 40" and says it is [place]. He says he was admitted to the hospital for "my attitude." Speech was somewhat slurred without evidence of aphasia (identifies straw, thumb, cellphone). He was able to follow 3 step commands but with difficulty. Cranial Nerves: II: pupils equal, round, reactive to light and accommodation III,IV, VI: no ptosis, extra-ocular motions intact bilaterally V,VII: smile slightly asymmetric at lip, facial light touch sensation intact bilaterally VIII: hearing intact to voice IX,X: Palate rises symmetrically XI: shoulders symmetrically elevate bilaterally XII: midline tongue extension without atrophy and without  fasciculations Motor: Right : Upper extremity   5/5 full power  Lower extremity   4/5 full range of motion against gravity and offers some resistance Left: Upper extremity   5/5 full power Lower extremity   4/5 full range of motion against gravity and offers some resistance Tone and bulk: Notable rigid flexor positioning of bilateral upper extremities while awake. no atrophy noted Sensory: sensation to light touch intact throughout bilaterally Cerebellar: Finger-to-nose test impaired due to inability to extend arms Gait: Deferred  Lab Results: Results for orders placed or performed during the hospital encounter of 02/15/23 (from the past 48 hour(s))  Culture, blood (single)     Status: None (Preliminary result)   Collection Time: 02/15/23  3:16 AM   Specimen: BLOOD  Result Value Ref Range   Specimen Description BLOOD BLOOD RIGHT HAND    Special Requests      BOTTLES DRAWN AEROBIC AND ANAEROBIC Blood Culture results may not be optimal due to an inadequate volume of blood received in culture bottles   Culture      NO GROWTH 1 DAY Performed at [facility], [place] [address], [place], [place] [address]    Report Status PENDING   CBC     Status: Abnormal   Collection Time: 02/15/23  3:20 AM  Result Value Ref Range   WBC 4.4 4.0 - 10.5 K/uL   RBC 4.72 4.22 -  5.81 MIL/uL   Hemoglobin 12.9 (L) 13.0 - 17.0 g/dL   HCT 36.1 (L) 39.0 - 52.0 %   MCV 76.5 (L) 80.0 - 100.0 fL   MCH 27.3 26.0 - 34.0 pg   MCHC 35.7 30.0 - 36.0 g/dL   RDW 15.5 11.5 - 15.5 %   Platelets 98 (L) 150 - 400 K/uL    Comment: REPEATED TO VERIFY   nRBC 0.0 0.0 - 0.2 %    Comment: Performed at [facility] [address] [place], [place] [address]  Comprehensive metabolic panel     Status: Abnormal   Collection Time: 02/15/23  3:20 AM  Result Value Ref Range   Sodium 121 (L) 135 - 145 mmol/L   Potassium 4.2 3.5 - 5.1 mmol/L   Chloride 79 (L) 98 - 111 mmol/L   CO2 24 22 - 32 mmol/L   Glucose, Bld 101  (H) 70 - 99 mg/dL    Comment: Glucose reference range applies only to samples taken after fasting for at least 8 hours.   BUN 11 6 - 20 mg/dL   Creatinine, Ser 1.03 0.61 - 1.24 mg/dL   Calcium 9.4 8.9 - 10.3 mg/dL   Total Protein 9.5 (H) 6.5 - 8.1 g/dL   Albumin 3.7 3.5 - 5.0 g/dL   AST 124 (H) 15 - 41 U/L   ALT 44 0 - 44 U/L   Alkaline Phosphatase 61 38 - 126 U/L   Total Bilirubin 1.4 (H) 0.3 - 1.2 mg/dL   GFR, Estimated >60 >60 mL/min    Comment: (NOTE) Calculated using the CKD-EPI Creatinine Equation (2021)    Anion gap 18 (H) 5 - 15    Comment: Performed at [facility], [place] [address] [place], [place] [address]  Lactic acid, plasma     Status: Abnormal   Collection Time: 02/15/23  3:20 AM  Result Value Ref Range   Lactic Acid, Venous 4.6 (HH) 0.5 - 1.9 mmol/L    Comment: CRITICAL RESULT CALLED TO, READ BACK BY AND VERIFIED WITH M.ALLEN,RN. [id number] 02/15/23. LPAIT Performed at [facility], [place] [address] [place], [place] [address]   Protime-INR     Status: Abnormal   Collection Time: 02/15/23  3:20 AM  Result Value Ref Range   Prothrombin Time 16.7 (H) 11.4 - 15.2 seconds   INR 1.4 (H) 0.8 - 1.2    Comment: (NOTE) INR goal varies based on device and disease states. Performed at [facility], [place] [address], [place], [place] [address]   Ethanol     Status: None   Collection Time: 02/15/23  3:20 AM  Result Value Ref Range   Alcohol, Ethyl (B) <10 <10 mg/dL    Comment: (NOTE) Lowest detectable limit for serum alcohol is 10 mg/dL.  For medical purposes only. Performed at [facility], [place] [address], [place], [place] [address]   CK     Status: Abnormal   Collection Time: 02/15/23  3:20 AM  Result Value Ref Range   Total CK 1,761 (H) 49 - 397 U/L    Comment: Performed at [facility], [place] [address], [place], [place] [address]  Ammonia     Status: None   Collection Time: 02/15/23  4:38 AM  Result Value Ref Range   Ammonia 32 9 - 35  umol/L    Comment: Performed at [facility], [place] [address] [place], [place] [address]  CBG monitoring, ED  Status: None   Collection Time: 02/15/23  5:04 AM  Result Value Ref Range   Glucose-Capillary 88 70 - 99 mg/dL    Comment: Glucose reference range applies only to samples taken after fasting for at least 8 hours.  I-Stat arterial blood gas, ED     Status: Abnormal   Collection Time: 02/15/23  8:40 AM  Result Value Ref Range   pH, Arterial 7.527 (H) 7.35 - 7.45   pCO2 arterial 26.1 (L) 32 - 48 mmHg   pO2, Arterial 80 (L) 83 - 108 mmHg   Bicarbonate 21.7 20.0 - 28.0 mmol/L   TCO2 22 22 - 32 mmol/L   O2 Saturation 97 %   Acid-Base Excess 0.0 0.0 - 2.0 mmol/L   Sodium 123 (L) 135 - 145 mmol/L   Potassium 2.5 (LL) 3.5 - 5.1 mmol/L   Calcium, Ion 1.04 (L) 1.15 - 1.40 mmol/L   HCT 35.0 (L) 39.0 - 52.0 %   Hemoglobin 11.9 (L) 13.0 - 17.0 g/dL   Patient temperature 98.5 F    Collection site RADIAL, ALLEN'S TEST ACCEPTABLE    Drawn by RT    Sample type ARTERIAL    Comment NOTIFIED PHYSICIAN   Urinalysis, Routine w reflex microscopic -Urine, Clean Catch     Status: Abnormal   Collection Time: 02/15/23  8:55 AM  Result Value Ref Range   Color, Urine STRAW (A) YELLOW   APPearance CLEAR CLEAR   Specific Gravity, Urine 1.006 1.005 - 1.030   pH 6.0 5.0 - 8.0   Glucose, UA NEGATIVE NEGATIVE mg/dL   Hgb urine dipstick MODERATE (A) NEGATIVE   Bilirubin Urine NEGATIVE NEGATIVE   Ketones, ur 20 (A) NEGATIVE mg/dL   Protein, ur NEGATIVE NEGATIVE mg/dL   Nitrite NEGATIVE NEGATIVE   Leukocytes,Ua NEGATIVE NEGATIVE   RBC / HPF 6-10 0 - 5 RBC/hpf   WBC, UA 0-5 0 - 5 WBC/hpf   Bacteria, UA RARE (A) NONE SEEN   Squamous Epithelial / HPF 0-5 0 - 5 /HPF   Hyaline Casts, UA PRESENT     Comment: Performed at [facility], [address], [place], [place] [address]  Lactic acid, plasma     Status: None   Collection Time: 02/15/23  8:55 AM  Result Value Ref Range   Lactic Acid,  Venous 1.6 0.5 - 1.9 mmol/L    Comment: Performed at [facility] [address], [place], [place] [address]  Glucose, capillary     Status: None   Collection Time: 02/15/23  9:47 AM  Result Value Ref Range   Glucose-Capillary 83 70 - 99 mg/dL    Comment: Glucose reference range applies only to samples taken after fasting for at least 8 hours.  MRSA Next Gen by PCR, Nasal     Status: None   Collection Time: 02/15/23  9:49 AM   Specimen: Nasal Mucosa; Nasal Swab  Result Value Ref Range   MRSA by PCR Next Gen NOT DETECTED NOT DETECTED    Comment: (NOTE) The GeneXpert MRSA Assay (FDA approved for NASAL specimens only), is one component of a comprehensive MRSA colonization surveillance program. It is not intended to diagnose MRSA infection nor to guide or monitor treatment for MRSA infections. Test performance is not FDA approved in patients less than 21 years old. Performed at [facility], [place] [address] [place], [place] [address]   HIV Antibody (routine testing w rflx)     Status: None   Collection  Time: 02/15/23 10:59 AM  Result Value Ref Range   HIV Screen 4th Generation wRfx Non Reactive Non Reactive    Comment: Performed at [facility], [place] [address], [place], [place] [address]  Lactic acid, plasma     Status: None   Collection Time: 02/15/23 11:09 AM  Result Value Ref Range   Lactic Acid, Venous 1.8 0.5 - 1.9 mmol/L    Comment: Performed at [facility] [address], [place], [place] [address]  CBC     Status: None   Collection Time: 02/15/23 11:09 AM  Result Value Ref Range   WBC  4.0 - 10.5 K/uL    PATIENT IDENTIFICATION ERROR. PLEASE DISREGARD RESULTS. ACCOUNT WILL BE CREDITED.    Comment: NOTIFIED FELICIA FLYNT,RN AT [id number] 02/15/2023 BY ZBEECH. CORRECTED ON 03/07 AT 1250: PREVIOUSLY REPORTED AS 5.5    RBC  4.22 - 5.81 MIL/uL    PATIENT IDENTIFICATION ERROR. PLEASE DISREGARD RESULTS. ACCOUNT WILL BE CREDITED.    Comment: NOTIFIED FELICIA  FLYNT,RN AT [id number] 02/15/2023 BY ZBEECH. CORRECTED ON 03/07 AT 1250: PREVIOUSLY REPORTED AS 3.91    Hemoglobin  13.0 - 17.0 g/dL    PATIENT IDENTIFICATION ERROR. PLEASE DISREGARD RESULTS. ACCOUNT WILL BE CREDITED.    Comment: NOTIFIED FELICIA FLYNT,RN AT [id number] 02/15/2023 BY ZBEECH. CORRECTED ON 03/07 AT 1250: PREVIOUSLY REPORTED AS 10.9    HCT  39.0 - 52.0 %    PATIENT IDENTIFICATION ERROR. PLEASE DISREGARD RESULTS. ACCOUNT WILL BE CREDITED.    Comment: NOTIFIED FELICIA FLYNT,RN AT [id number] 02/15/2023 BY ZBEECH. CORRECTED ON 03/07 AT 1250: PREVIOUSLY REPORTED AS 29.9    MCV  80.0 - 100.0 fL    PATIENT IDENTIFICATION ERROR. PLEASE DISREGARD RESULTS. ACCOUNT WILL BE CREDITED.    Comment: NOTIFIED FELICIA FLYNT,RN AT [id number] 02/15/2023 BY ZBEECH. CORRECTED ON 03/07 AT 1250: PREVIOUSLY REPORTED AS 76.5    MCH  26.0 - 34.0 pg    PATIENT IDENTIFICATION ERROR. PLEASE DISREGARD RESULTS. ACCOUNT WILL BE CREDITED.    Comment: NOTIFIED FELICIA FLYNT,RN AT [id number] 02/15/2023 BY ZBEECH. CORRECTED ON 03/07 AT 1250: PREVIOUSLY REPORTED AS 27.9    MCHC  30.0 - 36.0 g/dL    PATIENT IDENTIFICATION ERROR. PLEASE DISREGARD RESULTS. ACCOUNT WILL BE CREDITED.    Comment: NOTIFIED FELICIA FLYNT,RN AT [id number] 02/15/2023 BY ZBEECH. CORRECTED ON 03/07 AT 1250: PREVIOUSLY REPORTED AS 36.5    RDW  11.5 - 15.5 %    PATIENT IDENTIFICATION ERROR. PLEASE DISREGARD RESULTS. ACCOUNT WILL BE CREDITED.    Comment: NOTIFIED FELICIA FLYNT,RN AT [id number] 02/15/2023 BY ZBEECH. CORRECTED ON 03/07 AT 1250: PREVIOUSLY REPORTED AS 15.4    Platelets  150 - 400 K/uL    PATIENT IDENTIFICATION ERROR. PLEASE DISREGARD RESULTS. ACCOUNT WILL BE CREDITED.    Comment: NOTIFIED FELICIA FLYNT,RN AT [id number] 02/15/2023 BY ZBEECH. CORRECTED ON 03/07 AT 1250: PREVIOUSLY REPORTED AS 78 Immature Platelet Fraction may be clinically indicated, consider ordering this additional test [id number] REPEATED TO VERIFY    nRBC  0.0 - 0.2 %    PATIENT IDENTIFICATION ERROR.  PLEASE DISREGARD RESULTS. ACCOUNT WILL BE CREDITED.    Comment: NOTIFIED FELICIA FLYNT,RN AT [id number] 02/15/2023 BY ZBEECH. Performed at [facility], [place] [address], [place], [place] [address] CORRECTED ON 03/07 AT 1250: PREVIOUSLY REPORTED AS 0.0   Magnesium     Status: None   Collection Time: 02/15/23 11:09 AM  Result Value Ref Range   Magnesium 2.2 1.7 - 2.4 mg/dL    Comment: Performed  at [facility], [place] [address] [place], [place] [address]  Phosphorus     Status: None   Collection Time: 02/15/23 11:09 AM  Result Value Ref Range   Phosphorus 3.2 2.5 - 4.6 mg/dL    Comment: Performed at [facility] [address], [place], [place] [address]  Glucose, capillary     Status: None   Collection Time: 02/15/23 11:12 AM  Result Value Ref Range   Glucose-Capillary 93 70 - 99 mg/dL    Comment: Glucose reference range applies only to samples taken after fasting for at least 8 hours.  Glucose, capillary     Status: None   Collection Time: 02/15/23  3:06 PM  Result Value Ref Range   Glucose-Capillary 84 70 - 99 mg/dL    Comment: Glucose reference range applies only to samples taken after fasting for at least 8 hours.  Lactic acid, plasma     Status: None   Collection Time: 02/15/23  3:17 PM  Result Value Ref Range   Lactic Acid, Venous 1.9 0.5 - 1.9 mmol/L    Comment: Performed at [facility] [address] [place], [place] [id number]  Basic metabolic panel     Status: Abnormal   Collection Time: 02/15/23  3:17 PM  Result Value Ref Range   Sodium 124 (L) 135 - 145 mmol/L   Potassium 2.7 (LL) 3.5 - 5.1 mmol/L    Comment: REPEATED TO VERIFY DELTA CHECK NOTED CRITICAL RESULT CALLED TO, READ BACK BY AND VERIFIED WITH J.GRANADOS RN '@[id number]'$  03.07.2024 E.AHMED    Chloride 88 (L) 98 - 111 mmol/L   CO2 20 (L) 22 - 32 mmol/L   Glucose, Bld 81 70 - 99 mg/dL    Comment: Glucose reference range applies only to samples taken after fasting for at least 8 hours.   BUN 7 6  - 20 mg/dL   Creatinine, Ser 0.71 0.61 - 1.24 mg/dL   Calcium 8.2 (L) 8.9 - 10.3 mg/dL   GFR, Estimated >60 >60 mL/min    Comment: (NOTE) Calculated using the CKD-EPI Creatinine Equation (2021)    Anion gap 16 (H) 5 - 15    Comment: Performed at [facility] [address] [place],  [id number]  Basic metabolic panel     Status: Abnormal   Collection Time: 02/15/23  7:53 PM  Result Value Ref Range   Sodium 123 (L) 135 - 145 mmol/L   Potassium 3.9 3.5 - 5.1 mmol/L    Comment: HEMOLYSIS AT THIS LEVEL MAY AFFECT RESULT DELTA CHECK NOTED    Chloride 90 (L) 98 - 111 mmol/L   CO2 23 22 - 32 mmol/L   Glucose, Bld 87 70 - 99 mg/dL    Comment: Glucose reference range applies only to samples taken after fasting for at least 8 hours.   BUN 7 6 - 20 mg/dL   Creatinine, Ser 0.68 0.61 - 1.24 mg/dL   Calcium 8.2 (L) 8.9 - 10.3 mg/dL   GFR, Estimated >60 >60 mL/min    Comment: (NOTE) Calculated using the CKD-EPI Creatinine Equation (2021)    Anion gap 10 5 - 15    Comment: Performed at [facility] [address] [place], [place] [address]  Glucose, capillary     Status: None   Collection Time: 02/15/23  7:55 PM  Result Value Ref Range   Glucose-Capillary 81 70 - 99 mg/dL    Comment: Glucose reference range applies only  to samples taken after fasting for at least 8 hours.  Basic metabolic panel     Status: Abnormal   Collection Time: 02/15/23 10:12 PM  Result Value Ref Range   Sodium 124 (L) 135 - 145 mmol/L   Potassium 3.2 (L) 3.5 - 5.1 mmol/L   Chloride 90 (L) 98 - 111 mmol/L   CO2 19 (L) 22 - 32 mmol/L   Glucose, Bld 81 70 - 99 mg/dL    Comment: Glucose reference range applies only to samples taken after fasting for at least 8 hours.   BUN 8 6 - 20 mg/dL   Creatinine, Ser 0.64 0.61 - 1.24 mg/dL   Calcium 8.2 (L) 8.9 - 10.3 mg/dL   GFR, Estimated >60 >60 mL/min    Comment: (NOTE) Calculated using the CKD-EPI Creatinine Equation (2021)    Anion gap 15 5 - 15     Comment: Performed at [facility] [address], [place], [place] [address]  Glucose, capillary     Status: Abnormal   Collection Time: 02/15/23 11:53 PM  Result Value Ref Range   Glucose-Capillary 128 (H) 70 - 99 mg/dL    Comment: Glucose reference range applies only to samples taken after fasting for at least 8 hours.  Glucose, capillary     Status: None   Collection Time: 02/16/23  3:41 AM  Result Value Ref Range   Glucose-Capillary 90 70 - 99 mg/dL    Comment: Glucose reference range applies only to samples taken after fasting for at least 8 hours.  Basic metabolic panel     Status: Abnormal   Collection Time: 02/16/23  7:42 AM  Result Value Ref Range   Sodium 125 (L) 135 - 145 mmol/L   Potassium 3.6 3.5 - 5.1 mmol/L   Chloride 90 (L) 98 - 111 mmol/L   CO2 20 (L) 22 - 32 mmol/L   Glucose, Bld 84 70 - 99 mg/dL    Comment: Glucose reference range applies only to samples taken after fasting for at least 8 hours.   BUN 6 6 - 20 mg/dL   Creatinine, Ser 0.67 0.61 - 1.24 mg/dL   Calcium 8.3 (L) 8.9 - 10.3 mg/dL   GFR, Estimated >60 >60 mL/min    Comment: (NOTE) Calculated using the CKD-EPI Creatinine Equation (2021)    Anion gap 15 5 - 15    Comment: Performed at [facility] [address], [place],  [address]  CBC with Differential/Platelet     Status: Abnormal   Collection Time: 02/16/23  7:42 AM  Result Value Ref Range   WBC 5.5 4.0 - 10.5 K/uL   RBC 4.06 (L) 4.22 - 5.81 MIL/uL   Hemoglobin 11.0 (L) 13.0 - 17.0 g/dL   HCT 32.3 (L) 39.0 - 52.0 %   MCV 79.6 (L) 80.0 - 100.0 fL   MCH 27.1 26.0 - 34.0 pg   MCHC 34.1 30.0 - 36.0 g/dL   RDW 16.0 (H) 11.5 - 15.5 %   Platelets 80 (L) 150 - 400 K/uL    Comment: Immature Platelet Fraction may be clinically indicated, consider ordering this additional test [id number] REPEATED TO VERIFY    nRBC 0.0 0.0 - 0.2 %   Neutrophils Relative % 75 %   Neutro Abs 4.1 1.7 - 7.7 K/uL   Lymphocytes Relative 11 %    Lymphs Abs 0.6 (L) 0.7 - 4.0 K/uL   Monocytes Relative 13 %   Monocytes Absolute 0.7 0.1 - 1.0  K/uL   Eosinophils Relative 0 %   Eosinophils Absolute 0.0 0.0 - 0.5 K/uL   Basophils Relative 1 %   Basophils Absolute 0.0 0.0 - 0.1 K/uL   Immature Granulocytes 0 %   Abs Immature Granulocytes 0.01 0.00 - 0.07 K/uL    Comment: Performed at [facility] [address] [place], [place] [address]  Magnesium     Status: None   Collection Time: 02/16/23  7:42 AM  Result Value Ref Range   Magnesium 1.7 1.7 - 2.4 mg/dL    Comment: Performed at [facility] [address] [place],  [address]  Glucose, capillary     Status: None   Collection Time: 02/16/23  8:07 AM  Result Value Ref Range   Glucose-Capillary 74 70 - 99 mg/dL    Comment: Glucose reference range applies only to samples taken after fasting for at least 8 hours.    Recent Results (from the past 240 hour(s))  Resp panel by RT-PCR (RSV, Flu A&B, Covid) Anterior Nasal Swab     Status: None   Collection Time: 02/13/23  1:46 PM   Specimen: Anterior Nasal Swab  Result Value Ref Range Status   SARS Coronavirus 2 by RT PCR NEGATIVE NEGATIVE Final    Comment: (NOTE) SARS-CoV-2 target nucleic acids are NOT DETECTED.  The SARS-CoV-2 RNA is generally detectable in upper respiratory specimens during the acute phase of infection. The lowest concentration of SARS-CoV-2 viral copies this assay can detect is 138 copies/mL. A negative result does not preclude SARS-Cov-2 infection and should not be used as the sole basis for treatment or other patient management decisions. A negative result may occur with  improper specimen collection/handling, submission of specimen other than nasopharyngeal swab, presence of viral mutation(s) within the areas targeted by this assay, and inadequate number of viral copies(<138 copies/mL). A negative result must be combined with clinical observations, patient history, and  epidemiological information. The expected result is Negative.  Fact Sheet for Patients:  EntrepreneurPulse.com.au  Fact Sheet for Healthcare Providers:  IncredibleEmployment.be  This test is no t yet approved or cleared by the [place] FDA and  has been authorized for detection and/or diagnosis of SARS-CoV-2 by FDA under an Emergency Use Authorization (EUA). This EUA will remain  in effect (meaning this test can be used) for the duration of the COVID-19 declaration under Section 564(b)(1) of the Act, 21 U.S.C.section 360bbb-3(b)(1), unless the authorization is terminated  or revoked sooner.       Influenza A by PCR NEGATIVE NEGATIVE Final   Influenza B by PCR NEGATIVE NEGATIVE Final    Comment: (NOTE) The Xpert Xpress SARS-CoV-2/FLU/RSV plus assay is intended as an aid in the diagnosis of influenza from Nasopharyngeal swab specimens and should not be used as a sole basis for treatment. Nasal washings and aspirates are unacceptable for Xpert Xpress SARS-CoV-2/FLU/RSV testing.  Fact Sheet for Patients: EntrepreneurPulse.com.au  Fact Sheet for Healthcare Providers: IncredibleEmployment.be  This test is not yet approved or cleared by the [place] FDA and has been authorized for detection and/or diagnosis of SARS-CoV-2 by FDA under an Emergency Use Authorization (EUA). This EUA will remain in effect (meaning this test can be used) for the duration of the COVID-19 declaration under Section 564(b)(1) of the Act, 21 U.S.C. section 360bbb-3(b)(1), unless the authorization is terminated or revoked.     Resp Syncytial Virus by PCR NEGATIVE NEGATIVE Final    Comment: (NOTE) Fact Sheet for Patients: EntrepreneurPulse.com.au  Fact Sheet for Healthcare Providers: IncredibleEmployment.be  This test is not yet approved or cleared by the [place] FDA and has been  authorized for detection and/or diagnosis of SARS-CoV-2 by FDA under an Emergency Use Authorization (EUA). This EUA will remain in effect (meaning this test can be used) for the duration of the COVID-19 declaration under Section 564(b)(1) of the Act, 21 U.S.C. section 360bbb-3(b)(1), unless the authorization is terminated or revoked.  Performed at [facility], [place] [address] [place], [place] [address]   Culture, blood (single)     Status: None (Preliminary result)   Collection Time: 02/15/23  3:16 AM   Specimen: BLOOD  Result Value Ref Range Status   Specimen Description BLOOD BLOOD RIGHT HAND  Final   Special Requests   Final    BOTTLES DRAWN AEROBIC AND ANAEROBIC Blood Culture results may not be optimal due to an inadequate volume of blood received in culture bottles   Culture   Final    NO GROWTH 1 DAY Performed at [facility], [place] [address] [place], [place] [address]    Report Status PENDING  Incomplete  MRSA Next Gen by PCR, Nasal     Status: None   Collection Time: 02/15/23  9:49 AM   Specimen: Nasal Mucosa; Nasal Swab  Result Value Ref Range Status   MRSA by PCR Next Gen NOT DETECTED NOT DETECTED Final    Comment: (NOTE) The GeneXpert MRSA Assay (FDA approved for NASAL specimens only), is one component of a comprehensive MRSA colonization surveillance program. It is not intended to diagnose MRSA infection nor to guide or monitor treatment for MRSA infections. Test performance is not FDA approved in patients less than 41 years old. Performed at [facility], [place] [address] [place], [place] [address]     Lipid Panel No results for input(s): "CHOL", "TRIG", "HDL", "CHOLHDL", "VLDL", "LDLCALC" in the last 72 hours.  Studies/Results: DG Abd Portable 1V  Result Date: 02/16/2023 CLINICAL DATA:  NG tube placement. EXAM: PORTABLE ABDOMEN - 1 VIEW COMPARISON:  05/22/2016. FINDINGS: Feeding tube tip projects over the distal stomach. No free  air under the diaphragm. Unremarkable bowel-gas pattern. IMPRESSION: Feeding tube tip projects over the distal stomach. Electronically Signed   By: Emmit Alexanders M.D.   On: 02/16/2023 11:18   EEG adult  Result Date: 02/15/2023 Lora Havens, MD     02/15/2023  4:54 PM Patient Name: Stephen Carroll MRN: [id number] Epilepsy Attending: Lora Havens Referring Physician/Provider: Kerney Elbe, MD Date: 02/15/2023 Duration: 26.01 mins Patient history: 57yo M with ams. EEG to evaluate for seizure Level of alertness: Awake, asleep AEDs during EEG study: Phenobarb, Ativan Technical aspects: This EEG study was done with scalp electrodes positioned according to the 10-20 International system of electrode placement. Electrical activity was reviewed with band pass filter of 1-'70Hz'$ , sensitivity of 7 uV/mm, display speed of 30m/sec with a '60Hz'$  notched filter applied as appropriate. EEG data were recorded continuously and digitally stored.  Video monitoring was available and reviewed as appropriate. Description: No clear posterior dominant rhythm was seen. Sleep was characterized by vertex waves, sleep spindles (12 to 14 Hz), maximal frontocentral region.  There is an excessive amount of 15 to 18 Hz beta activity distributed symmetrically and diffusely. Hyperventilation and photic stimulation were not performed.   ABNORMALITY - Excessive beta, generalized IMPRESSION: This study is within normal limits. The excessive beta activity seen in the background is most likely due to  the effect of benzodiazepine and is a benign EEG pattern. No seizures or epileptiform discharges were seen throughout the recording. Priyanka Barbra Sarks   US Abdomen Limited RUQ (LIVER/GB)  Result Date: 02/15/2023 CLINICAL DATA:  Hyperbilirubinemia EXAM: ULTRASOUND ABDOMEN LIMITED RIGHT UPPER QUADRANT COMPARISON:  CT 02/13/2023 FINDINGS: Gallbladder: No gallstones or wall thickening visualized. No sonographic Murphy sign noted by sonographer. Common  bile duct: Diameter: 3 mm Liver: Diffusely echogenic hepatic parenchyma consistent with fatty liver infiltration. With this level of echogenicity evaluation for underlying mass lesion is limited. Please correlate with prior contrast CT. Portal vein is patent on color Doppler imaging with normal direction of blood flow towards the liver. Other: Trace ascites IMPRESSION: Fatty liver infiltration. No gallstones or ductal dilatation. Electronically Signed   By: Jill Side M.D.   On: 02/15/2023 12:11   CT HEAD WO CONTRAST (5MM)  Result Date: 02/15/2023 CLINICAL DATA:  Delirium EXAM: CT HEAD WITHOUT CONTRAST TECHNIQUE: Contiguous axial images were obtained from the base of the skull through the vertex without intravenous contrast. RADIATION DOSE REDUCTION: This exam was performed according to the departmental dose-optimization program which includes automated exposure control, adjustment of the mA and/or kV according to patient size and/or use of iterative reconstruction technique. COMPARISON:  06/15/2014. FINDINGS: Brain: There is periventricular white matter decreased attenuation consistent with small vessel ischemic changes. Ventricles, sulci and cisterns are prominent consistent with age related involutional changes. No acute intracranial hemorrhage, mass effect or shift. No hydrocephalus. Small foci of encephalomalacia bilateral basal ganglia consistent with old lacunar CVAs. Vascular: No hyperdense vessel or unexpected calcification. Skull: Normal. Negative for fracture or focal lesion. Sinuses/Orbits: No acute finding. IMPRESSION: Atrophy and chronic small vessel ischemic changes. Chronic basal ganglia lacunar CVAs. No acute intracranial process identified. Electronically Signed   By: Sammie Bench M.D.   On: 02/15/2023 07:48   CT CHEST WO CONTRAST  Result Date: 02/15/2023 CLINICAL DATA:  56 year old male with suspected pneumonia. Delirium. EXAM: CT CHEST WITHOUT CONTRAST TECHNIQUE: Multidetector CT  imaging of the chest was performed following the standard protocol without IV contrast. RADIATION DOSE REDUCTION: This exam was performed according to the departmental dose-optimization program which includes automated exposure control, adjustment of the mA and/or kV according to patient size and/or use of iterative reconstruction technique. COMPARISON:  No priors. FINDINGS: Cardiovascular: Heart size is normal. There is no significant pericardial fluid, thickening or pericardial calcification. There is aortic atherosclerosis, as well as atherosclerosis of the great vessels of the mediastinum and the coronary arteries, including calcified atherosclerotic plaque in the left anterior descending and right coronary arteries. Mediastinum/Nodes: No pathologically enlarged mediastinal or hilar lymph nodes. Please note that accurate exclusion of hilar adenopathy is limited on noncontrast CT scans. Esophagus is unremarkable in appearance. No axillary lymphadenopathy. Lungs/Pleura: Study is severely limited by a large amount of patient respiratory motion. With these limitations in mind there are no suspicious appearing pulmonary nodules or masses. No acute consolidative airspace disease. No pleural effusions. Upper Abdomen: Severe diffuse low attenuation throughout the visualized hepatic parenchyma, indicative of a background of severe hepatic steatosis. Aortic atherosclerosis. Musculoskeletal: Orthopedic fixation hardware in the lumbar spine incompletely imaged. There are no aggressive appearing lytic or blastic lesions noted in the visualized portions of the skeleton. IMPRESSION: 1. No acute findings in the thorax to account for the patient's symptoms. 2. Aortic atherosclerosis, in addition to two-vessel coronary artery disease. Please note that although the presence of coronary artery calcium documents the presence of coronary artery disease, the  severity of this disease and any potential stenosis cannot be assessed on  this non-gated CT examination. Assessment for potential risk factor modification, dietary therapy or pharmacologic therapy may be warranted, if clinically indicated. 3. Severe hepatic steatosis. Aortic Atherosclerosis (ICD10-I70.0). Electronically Signed   By: Vinnie Langton M.D.   On: 02/15/2023 07:43   DG Chest Port 1 View  Result Date: 02/15/2023 CLINICAL DATA:  Chills EXAM: PORTABLE CHEST 1 VIEW COMPARISON:  02/13/23 FINDINGS: The heart size and mediastinal contours are within normal limits. Both lungs are clear. The visualized skeletal structures are unremarkable. IMPRESSION: No active disease. Electronically Signed   By: Inez Catalina M.D.   On: 02/15/2023 03:34    Medications: Scheduled:  Chlorhexidine Gluconate Cloth  6 each Topical Daily   feeding supplement (PROSource TF20)  60 mL Per Tube Daily   folic acid  1 mg Per Tube Daily   heparin  5,000 Units Subcutaneous Q8H   multivitamin with minerals  1 tablet Per Tube Daily   phenobarbital  97.2 mg Per Tube Q8H   Followed by   [START ON 02/17/2023] phenobarbital  64.8 mg Per Tube Q8H   Followed by   [START ON 02/19/2023] phenobarbital  32.4 mg Per Tube Q8H   Continuous:  sodium chloride 75 mL/hr at 02/16/23 1100   feeding supplement (OSMOLITE 1.5 CAL)     magnesium sulfate bolus IVPB 50 mL/hr at 02/16/23 1100   thiamine (VITAMIN B1) injection Stopped (02/16/23 0043)    Assessment  Patient presented on Thursday with significant impairments in mental status (obtundation, disorientation), autonomic hyperactivity (tachycardia, hypertension), and tremors in the setting of recent alcohol use. This is consistent with his self-reported last EtOH consumption on the night of 3/4 (now within the 48 - 96 hour window), supported by blood alcohol level measurements (114 on admission then down to < 10), and accompanied by relevant clinical risk factors, namely past delirium tremens history, cirrhosis, hypokalemia, thrombocytopenia, concurrent  gastrointestinal illness, and CIWA score > 15 (last CIWA 20).  - The timeline and accompanying symptoms are consistent with a diagnosis of delirium tremens. - While patient has prior history of alcohol withdrawal seizures (which was the basis for this consult), there were no clinically apparent seizures on presentation and also no reported witnessed seizure activity leading up to his acute alteration in mental status this admission. EEG was normal with expected beta activity due to phenobarbital sedation.  - AMS in conjunction with alcoholism does increase the probability of acute Wernicke's encephalopathy as well. He is on high dose thiamine.    Recommendations  - frequent CIWA assessments and symptom-triggered lorazepam treatment - continue IVF, high-dose thiamine supplementation - continue adjunctive phenobarbital therapy - broad-spectrum antibiotics per primary team - Neurology will sign-off at this time. Please call if there are additional questions.      LOS: 1 day   Camelia Phenes, MD [facility] Intern (PGY-1) 02/16/2023 11:24 AM  '@Electronically'$  signed: Dr. Kerney Elbe 02/16/2023  11:24 AM

## 2023-02-17 DIAGNOSIS — R4182 Altered mental status, unspecified: Secondary | ICD-10-CM | POA: Diagnosis not present

## 2023-02-17 DIAGNOSIS — E43 Unspecified severe protein-calorie malnutrition: Secondary | ICD-10-CM | POA: Diagnosis not present

## 2023-02-17 LAB — GLUCOSE, CAPILLARY
Glucose-Capillary: 135 mg/dL — ABNORMAL HIGH (ref 70–99)
Glucose-Capillary: 140 mg/dL — ABNORMAL HIGH (ref 70–99)
Glucose-Capillary: 133 mg/dL — ABNORMAL HIGH (ref 70–99)

## 2023-02-17 LAB — BASIC METABOLIC PANEL
Anion gap: 6 (ref 5–15)
BUN: 7 mg/dL (ref 6–20)
CO2: 24 mmol/L (ref 22–32)
Calcium: 8 mg/dL — ABNORMAL LOW (ref 8.9–10.3)
Chloride: 95 mmol/L — ABNORMAL LOW (ref 98–111)
Creatinine, Ser: 0.55 mg/dL — ABNORMAL LOW (ref 0.61–1.24)
GFR, Estimated: >60 mL/min (ref >60)
Glucose, Bld: 116 mg/dL — ABNORMAL HIGH (ref 70–99)
Potassium: 3 mmol/L — ABNORMAL LOW (ref 3.5–5.1)
Sodium: 125 mmol/L — ABNORMAL LOW (ref 135–145)

## 2023-02-17 LAB — MAGNESIUM
Magnesium: 2 mg/dL (ref 1.7–2.4)
Magnesium: 2 mg/dL (ref 1.7–2.4)

## 2023-02-17 LAB — COMPREHENSIVE METABOLIC PANEL
ALT: 28 U/L (ref 0–44)
AST: 71 U/L — ABNORMAL HIGH (ref 15–41)
Albumin: 2.7 g/dL — ABNORMAL LOW (ref 3.5–5.0)
Alkaline Phosphatase: 47 U/L (ref 38–126)
Anion gap: 8 (ref 5–15)
BUN: <5 mg/dL — ABNORMAL LOW (ref 6–20)
CO2: 26 mmol/L (ref 22–32)
Calcium: 8 mg/dL — ABNORMAL LOW (ref 8.9–10.3)
Chloride: 93 mmol/L — ABNORMAL LOW (ref 98–111)
Creatinine, Ser: 0.67 mg/dL (ref 0.61–1.24)
GFR, Estimated: >60 mL/min (ref >60)
Glucose, Bld: 108 mg/dL — ABNORMAL HIGH (ref 70–99)
Potassium: 2.9 mmol/L — ABNORMAL LOW (ref 3.5–5.1)
Sodium: 127 mmol/L — ABNORMAL LOW (ref 135–145)
Total Bilirubin: 0.7 mg/dL (ref 0.3–1.2)
Total Protein: 7 g/dL (ref 6.5–8.1)

## 2023-02-17 LAB — PHOSPHORUS
Phosphorus: 1.2 mg/dL — ABNORMAL LOW (ref 2.5–4.6)
Phosphorus: 3.1 mg/dL (ref 2.5–4.6)

## 2023-02-17 LAB — CBC
HCT: 28.7 % — ABNORMAL LOW (ref 39.0–52.0)
Hemoglobin: 10.1 g/dL — ABNORMAL LOW (ref 13.0–17.0)
MCH: 27.4 pg (ref 26.0–34.0)
MCHC: 35.2 g/dL (ref 30.0–36.0)
MCV: 77.8 fL — ABNORMAL LOW (ref 80.0–100.0)
Platelets: 73 10*3/uL — ABNORMAL LOW (ref 150–400)
RBC: 3.69 MIL/uL — ABNORMAL LOW (ref 4.22–5.81)
RDW: 16 % — ABNORMAL HIGH (ref 11.5–15.5)
WBC: 4 10*3/uL (ref 4.0–10.5)
nRBC: 0 % (ref 0.0–0.2)

## 2023-02-17 LAB — CK: Total CK: 519 U/L — ABNORMAL HIGH (ref 49–397)

## 2023-02-17 MED ORDER — POTASSIUM PHOSPHATES 15 MMOLE/5ML IV SOLN
30.0000 mmol | Freq: Once | INTRAVENOUS | Status: AC
  Administered 2023-02-17: 30 mmol via INTRAVENOUS
  Filled 2023-02-17: qty 10

## 2023-02-17 MED ORDER — POLYETHYLENE GLYCOL 3350 17 G PO PACK: 17.0000 g | PACK | Freq: Every day | ORAL | Status: DC | PRN

## 2023-02-17 MED ORDER — POTASSIUM CHLORIDE CRYS ER 20 MEQ PO TBCR
40.0000 meq | EXTENDED_RELEASE_TABLET | Freq: Four times a day (QID) | ORAL | Status: AC
  Administered 2023-02-17 (×2): 40 meq via ORAL
  Filled 2023-02-17 (×2): qty 2

## 2023-02-17 MED ORDER — DOCUSATE SODIUM 50 MG/5ML PO LIQD: 100.0000 mg | Freq: Two times a day (BID) | ORAL | Status: DC | PRN

## 2023-02-17 MED ORDER — PHENOBARBITAL 32.4 MG PO TABS
64.8000 mg | ORAL_TABLET | Freq: Three times a day (TID) | ORAL | Status: AC
  Administered 2023-02-17 – 2023-02-19 (×6): 64.8 mg via ORAL
  Filled 2023-02-17 (×6): qty 2

## 2023-02-17 MED ORDER — THIAMINE HCL 100 MG/ML IJ SOLN
250.0000 mg | Freq: Three times a day (TID) | INTRAVENOUS | Status: DC
  Administered 2023-02-18 – 2023-02-20 (×7): 250 mg via INTRAVENOUS
  Filled 2023-02-17 (×11): qty 2.5

## 2023-02-17 MED ORDER — POTASSIUM CHLORIDE CRYS ER 20 MEQ PO TBCR: 40.0000 meq | EXTENDED_RELEASE_TABLET | Freq: Two times a day (BID) | ORAL | Status: DC

## 2023-02-17 MED ORDER — K PHOS MONO-SOD PHOS DI & MONO 155-852-130 MG PO TABS
500.0000 mg | ORAL_TABLET | Freq: Three times a day (TID) | ORAL | Status: DC
  Administered 2023-02-17: 500 mg
  Filled 2023-02-17: qty 2

## 2023-02-17 MED ORDER — FOLIC ACID 1 MG PO TABS
1.0000 mg | ORAL_TABLET | Freq: Every day | ORAL | Status: DC
  Administered 2023-02-18 – 2023-02-20 (×3): 1 mg via ORAL
  Filled 2023-02-17 (×3): qty 1

## 2023-02-17 MED ORDER — ADULT MULTIVITAMIN W/MINERALS CH
1.0000 | ORAL_TABLET | Freq: Every day | ORAL | Status: DC
  Administered 2023-02-18 – 2023-02-20 (×3): 1 via ORAL
  Filled 2023-02-17 (×3): qty 1

## 2023-02-17 MED ORDER — PHENOBARBITAL 32.4 MG PO TABS
32.4000 mg | ORAL_TABLET | Freq: Three times a day (TID) | ORAL | Status: DC
  Administered 2023-02-19 – 2023-02-20 (×4): 32.4 mg via ORAL
  Filled 2023-02-17 (×4): qty 1

## 2023-02-17 MED ORDER — K PHOS MONO-SOD PHOS DI & MONO 155-852-130 MG PO TABS
500.0000 mg | ORAL_TABLET | Freq: Three times a day (TID) | ORAL | Status: DC
  Administered 2023-02-17 (×2): 500 mg via ORAL
  Filled 2023-02-17 (×2): qty 2

## 2023-02-17 NOTE — Progress Notes (Signed)
PROGRESS NOTE    Stephen Carroll  [id number] DOB: [birth year] DOA: 02/15/2023 PCP: Rise Patience, DO    Chief Complaint  Patient presents with   Fall   Alcohol Intoxication    Brief Narrative:   57 year old male with a known history of chronic alcohol abuse along with marijuana use who presents with altered mental status, lactic acidosis, chronic hyponatremia patient admitted to ICU due to his lactic acidosis and his history of seizures he will be admitted to the intensive care unit for close monitoring.  Assessment & Plan:    Hyponatremia -Likely due to beer potomania, improving slowly.,  Continue to monitor on IV NS.   Lactic acidosis -Improved with IV hydration, resolved, negative septic workup, off antibiotics  Alcohol abuse/severe withdrawal with DT -Continue with CIWA protocol -Continue with phenobarbital taper -Continue with IV thiamine  Acute toxic encephalopathy due to alcohol withdrawals -Neurology input greatly appreciated -Continue CIWA, IV thiamine, multivitamins   Rhabdomyolysis -CK trending down, continue with IV fluid   Severe protein energy malnutrition Deconditioning Falls Hypophosphatemia Hypokalemia -PT/OT consulted -Will consult SLP, meanwhile we will keep on bolus feeds via core track -Need to closely for refeeding syndrome -Monitor electrolytes closely and replete as needed   Anemia, chronic Thrombocytopenia Likely 2/2 chronic alcohol abuse. -Transfuse if Hgb <7  DVT prophylaxis: SCD Code Status: Full Family Communication: D/W brother and sister at bedside Disposition:   Status is: Inpatient    Consultants:  PCCM   Subjective:  He remains confused, he denies any complaints today  Objective: Vitals:   02/16/23 2351 02/17/23 0359 02/17/23 0400 02/17/23 0823  BP: 124/77 135/66  134/76  Pulse: 92 91  (!) 103  Resp: '18 19  17  '$ Temp: 98.1 F (36.7 C) 98.1 F (36.7 C)  100.3 F (37.9 C)  TempSrc: Axillary Axillary   Axillary  SpO2: 98% 99%    Weight:   67.4 kg   Height:        Intake/Output Summary (Last 24 hours) at 02/17/2023 1058 Last data filed at 02/17/2023 0412 Gross per 24 hour  Intake 2217.66 ml  Output 1850 ml  Net 367.66 ml   Filed Weights   02/15/23 0948 02/16/23 0430 02/17/23 0400  Weight: 61.9 kg 62.4 kg 67.4 kg    Examination:  Awake Alert, Oriented X 2, frail, deconditioned, with Midence and core Trak tube feed, confused with impaired judgment and insight Symmetrical Chest wall movement, Good air movement bilaterally, CTAB RRR,No Gallops,Rubs or new Murmurs, No Parasternal Heave +ve B.Sounds, Abd Soft, No tenderness, No rebound - guarding or rigidity. No Cyanosis, Clubbing or edema, No new Rash or bruise      Data Reviewed: I have personally reviewed following labs and imaging studies  CBC: Recent Labs  Lab 02/13/23 1246 02/15/23 0320 02/15/23 0840 02/15/23 1109 02/16/23 0742 02/17/23 0921  WBC 2.7* 4.4  --  PATIENT IDENTIFICATION ERROR. PLEASE DISREGARD RESULTS. ACCOUNT WILL BE CREDITED. 5.5 4.0  NEUTROABS 1.6*  --   --   --  4.1  --   HGB 9.7* 12.9* 11.9* PATIENT IDENTIFICATION ERROR. PLEASE DISREGARD RESULTS. ACCOUNT WILL BE CREDITED. 11.0* 10.1*  HCT 28.1* 36.1* 35.0* PATIENT IDENTIFICATION ERROR. PLEASE DISREGARD RESULTS. ACCOUNT WILL BE CREDITED. 32.3* 28.7*  MCV 78.9* 76.5*  --  PATIENT IDENTIFICATION ERROR. PLEASE DISREGARD RESULTS. ACCOUNT WILL BE CREDITED. 79.6* 77.8*  PLT 86* 98*  --  PATIENT IDENTIFICATION ERROR. PLEASE DISREGARD RESULTS. ACCOUNT WILL BE CREDITED. 80* 73*    Basic Metabolic Panel:  Recent Labs  Lab 02/13/23 1246 02/15/23 0320 02/15/23 1109 02/15/23 1517 02/15/23 2212 02/16/23 0742 02/16/23 1504 02/16/23 2255 02/17/23 0921  NA 126*   < >  --    < > 124* 125* 123* 125* 127*  K 2.9*   < >  --    < > 3.2* 3.6 2.9* 3.0* 2.9*  CL 95*   < >  --    < > 90* 90* 90* 95* 93*  CO2 22   < >  --    < > 19* 20* 20* 24 26  GLUCOSE 90   < >  --     < > 81 84 127* 116* 108*  BUN <5*   < >  --    < > '8 6 7 7 '$ <5*  CREATININE 0.57*   < >  --    < > 0.64 0.67 0.66 0.55* 0.67  CALCIUM 7.4*   < >  --    < > 8.2* 8.3* 8.1* 8.0* 8.0*  MG 1.8  --  2.2  --   --  1.7 2.2  --  2.0  PHOS  --   --  3.2  --   --   --  1.1*  --  1.2*   < > = values in this interval not displayed.    GFR: Estimated Creatinine Clearance: 99.5 mL/min (by C-G formula based on SCr of 0.67 mg/dL).  Liver Function Tests: Recent Labs  Lab 02/13/23 1246 02/15/23 0320 02/17/23 0921  AST 86* 124* 71*  ALT 36 44 28  ALKPHOS 46 61 47  BILITOT 0.6 1.4* 0.7  PROT 7.7 9.5* 7.0  ALBUMIN 3.2* 3.7 2.7*    CBG: Recent Labs  Lab 02/16/23 0341 02/16/23 0807 02/16/23 1142 02/16/23 1549 02/17/23 0835  GLUCAP 90 74 132* 130* 135*     Recent Results (from the past 240 hour(s))  Resp panel by RT-PCR (RSV, Flu A&B, Covid) Anterior Nasal Swab     Status: None   Collection Time: 02/13/23  1:46 PM   Specimen: Anterior Nasal Swab  Result Value Ref Range Status   SARS Coronavirus 2 by RT PCR NEGATIVE NEGATIVE Final    Comment: (NOTE) SARS-CoV-2 target nucleic acids are NOT DETECTED.  The SARS-CoV-2 RNA is generally detectable in upper respiratory specimens during the acute phase of infection. The lowest concentration of SARS-CoV-2 viral copies this assay can detect is 138 copies/mL. A negative result does not preclude SARS-Cov-2 infection and should not be used as the sole basis for treatment or other patient management decisions. A negative result may occur with  improper specimen collection/handling, submission of specimen other than nasopharyngeal swab, presence of viral mutation(s) within the areas targeted by this assay, and inadequate number of viral copies(<138 copies/mL). A negative result must be combined with clinical observations, patient history, and epidemiological information. The expected result is Negative.  Fact Sheet for Patients:   EntrepreneurPulse.com.au  Fact Sheet for Healthcare Providers:  IncredibleEmployment.be  This test is no t yet approved or cleared by the [place] FDA and  has been authorized for detection and/or diagnosis of SARS-CoV-2 by FDA under an Emergency Use Authorization (EUA). This EUA will remain  in effect (meaning this test can be used) for the duration of the COVID-19 declaration under Section 564(b)(1) of the Act, 21 U.S.C.section 360bbb-3(b)(1), unless the authorization is terminated  or revoked sooner.       Influenza A by PCR NEGATIVE NEGATIVE Final   Influenza  B by PCR NEGATIVE NEGATIVE Final    Comment: (NOTE) The Xpert Xpress SARS-CoV-2/FLU/RSV plus assay is intended as an aid in the diagnosis of influenza from Nasopharyngeal swab specimens and should not be used as a sole basis for treatment. Nasal washings and aspirates are unacceptable for Xpert Xpress SARS-CoV-2/FLU/RSV testing.  Fact Sheet for Patients: EntrepreneurPulse.com.au  Fact Sheet for Healthcare Providers: IncredibleEmployment.be  This test is not yet approved or cleared by the [place] FDA and has been authorized for detection and/or diagnosis of SARS-CoV-2 by FDA under an Emergency Use Authorization (EUA). This EUA will remain in effect (meaning this test can be used) for the duration of the COVID-19 declaration under Section 564(b)(1) of the Act, 21 U.S.C. section 360bbb-3(b)(1), unless the authorization is terminated or revoked.     Resp Syncytial Virus by PCR NEGATIVE NEGATIVE Final    Comment: (NOTE) Fact Sheet for Patients: EntrepreneurPulse.com.au  Fact Sheet for Healthcare Providers: IncredibleEmployment.be  This test is not yet approved or cleared by the [place] FDA and has been authorized for detection and/or diagnosis of SARS-CoV-2 by FDA under an Emergency Use  Authorization (EUA). This EUA will remain in effect (meaning this test can be used) for the duration of the COVID-19 declaration under Section 564(b)(1) of the Act, 21 U.S.C. section 360bbb-3(b)(1), unless the authorization is terminated or revoked.  Performed at [facility], [place] [address], [place], [place] [address]   Culture, blood (single)     Status: None (Preliminary result)   Collection Time: 02/15/23  3:16 AM   Specimen: BLOOD  Result Value Ref Range Status   Specimen Description BLOOD BLOOD RIGHT HAND  Final   Special Requests   Final    BOTTLES DRAWN AEROBIC AND ANAEROBIC Blood Culture results may not be optimal due to an inadequate volume of blood received in culture bottles   Culture   Final    NO GROWTH 2 DAYS Performed at [facility], [place] [address], [place], [place] [address]    Report Status PENDING  Incomplete  MRSA Next Gen by PCR, Nasal     Status: None   Collection Time: 02/15/23  9:49 AM   Specimen: Nasal Mucosa; Nasal Swab  Result Value Ref Range Status   MRSA by PCR Next Gen NOT DETECTED NOT DETECTED Final    Comment: (NOTE) The GeneXpert MRSA Assay (FDA approved for NASAL specimens only), is one component of a comprehensive MRSA colonization surveillance program. It is not intended to diagnose MRSA infection nor to guide or monitor treatment for MRSA infections. Test performance is not FDA approved in patients less than 19 years old. Performed at [facility], [place] [address] [place], [place] [address]          Radiology Studies: DG Abd Portable 1V  Result Date: 02/16/2023 CLINICAL DATA:  NG tube placement. EXAM: PORTABLE ABDOMEN - 1 VIEW COMPARISON:  05/22/2016. FINDINGS: Feeding tube tip projects over the distal stomach. No free air under the diaphragm. Unremarkable bowel-gas pattern. IMPRESSION: Feeding tube tip projects over the distal stomach. Electronically Signed   By: Emmit Alexanders M.D.   On: 02/16/2023  11:18   EEG adult  Result Date: 02/15/2023 Lora Havens, MD     02/15/2023  4:54 PM Patient Name: DALAS HARTSHORNE MRN: [id number] Epilepsy Attending: Lora Havens Referring Physician/Provider: Kerney Elbe, MD Date: 02/15/2023 Duration: 26.01 mins Patient history: 57yo M with ams. EEG to evaluate for seizure Level of alertness:  Awake, asleep AEDs during EEG study: Phenobarb, Ativan Technical aspects: This EEG study was done with scalp electrodes positioned according to the 10-20 International system of electrode placement. Electrical activity was reviewed with band pass filter of 1-'70Hz'$ , sensitivity of 7 uV/mm, display speed of 52m/sec with a '60Hz'$  notched filter applied as appropriate. EEG data were recorded continuously and digitally stored.  Video monitoring was available and reviewed as appropriate. Description: No clear posterior dominant rhythm was seen. Sleep was characterized by vertex waves, sleep spindles (12 to 14 Hz), maximal frontocentral region.  There is an excessive amount of 15 to 18 Hz beta activity distributed symmetrically and diffusely. Hyperventilation and photic stimulation were not performed.   ABNORMALITY - Excessive beta, generalized IMPRESSION: This study is within normal limits. The excessive beta activity seen in the background is most likely due to the effect of benzodiazepine and is a benign EEG pattern. No seizures or epileptiform discharges were seen throughout the recording. Priyanka OBarbra Sarks  [place]domen Limited RUQ (LIVER/GB)  Result Date: 02/15/2023 CLINICAL DATA:  Hyperbilirubinemia EXAM: ULTRASOUND ABDOMEN LIMITED RIGHT UPPER QUADRANT COMPARISON:  CT 02/13/2023 FINDINGS: Gallbladder: No gallstones or wall thickening visualized. No sonographic Murphy sign noted by sonographer. Common bile duct: Diameter: 3 mm Liver: Diffusely echogenic hepatic parenchyma consistent with fatty liver infiltration. With this level of echogenicity evaluation for underlying mass lesion is  limited. Please correlate with prior contrast CT. Portal vein is patent on color Doppler imaging with normal direction of blood flow towards the liver. Other: Trace ascites IMPRESSION: Fatty liver infiltration. No gallstones or ductal dilatation. Electronically Signed   By: AJill SideM.D.   On: 02/15/2023 12:11        Scheduled Meds:  Chlorhexidine Gluconate Cloth  6 each Topical Daily   feeding supplement (PROSource TF20)  60 mL Per Tube Daily   folic acid  1 mg Per Tube Daily   heparin  5,000 Units Subcutaneous Q8H   multivitamin with minerals  1 tablet Per Tube Daily   phenobarbital  64.8 mg Per Tube Q8H   Followed by   [START ON 02/19/2023] phenobarbital  32.4 mg Per Tube Q8H   potassium chloride  40 mEq Oral Q6H   Continuous Infusions:  sodium chloride 75 mL/hr at 02/17/23 0316   feeding supplement (OSMOLITE 1.5 CAL) 30 mL/hr at 02/16/23 1558   thiamine (VITAMIN B1) injection 500 mg (02/17/23 1041)     LOS: 2 days     DPhillips Climes MD [facility]   To contact the attending provider between 7A-7P or the covering provider during after hours 7P-7A, please log into the web site [email or web address] and access using universal [place] password for that web site. If you do not have the password, please call the hospital operator.  02/17/2023, 10:58 AM

## 2023-02-18 DIAGNOSIS — R4182 Altered mental status, unspecified: Secondary | ICD-10-CM | POA: Diagnosis not present

## 2023-02-18 DIAGNOSIS — E43 Unspecified severe protein-calorie malnutrition: Secondary | ICD-10-CM | POA: Diagnosis not present

## 2023-02-18 LAB — CBC
HCT: 25.5 % — ABNORMAL LOW (ref 39.0–52.0)
Hemoglobin: 8.9 g/dL — ABNORMAL LOW (ref 13.0–17.0)
MCH: 27.1 pg (ref 26.0–34.0)
MCHC: 34.9 g/dL (ref 30.0–36.0)
MCV: 77.5 fL — ABNORMAL LOW (ref 80.0–100.0)
Platelets: 80 10*3/uL — ABNORMAL LOW (ref 150–400)
RBC: 3.29 MIL/uL — ABNORMAL LOW (ref 4.22–5.81)
RDW: 16.4 % — ABNORMAL HIGH (ref 11.5–15.5)
WBC: 4.3 10*3/uL (ref 4.0–10.5)
nRBC: 0 % (ref 0.0–0.2)

## 2023-02-18 LAB — GLUCOSE, CAPILLARY
Glucose-Capillary: 125 mg/dL — ABNORMAL HIGH (ref 70–99)
Glucose-Capillary: 122 mg/dL — ABNORMAL HIGH (ref 70–99)
Glucose-Capillary: 105 mg/dL — ABNORMAL HIGH (ref 70–99)

## 2023-02-18 LAB — COMPREHENSIVE METABOLIC PANEL
ALT: 23 U/L (ref 0–44)
AST: 52 U/L — ABNORMAL HIGH (ref 15–41)
Albumin: 2.4 g/dL — ABNORMAL LOW (ref 3.5–5.0)
Alkaline Phosphatase: 43 U/L (ref 38–126)
Anion gap: 8 (ref 5–15)
BUN: 5 mg/dL — ABNORMAL LOW (ref 6–20)
CO2: 22 mmol/L (ref 22–32)
Calcium: 8.1 mg/dL — ABNORMAL LOW (ref 8.9–10.3)
Chloride: 99 mmol/L (ref 98–111)
Creatinine, Ser: 0.52 mg/dL — ABNORMAL LOW (ref 0.61–1.24)
GFR, Estimated: >60 mL/min (ref >60)
Glucose, Bld: 121 mg/dL — ABNORMAL HIGH (ref 70–99)
Potassium: 3.2 mmol/L — ABNORMAL LOW (ref 3.5–5.1)
Sodium: 129 mmol/L — ABNORMAL LOW (ref 135–145)
Total Bilirubin: 0.7 mg/dL (ref 0.3–1.2)
Total Protein: 6.3 g/dL — ABNORMAL LOW (ref 6.5–8.1)

## 2023-02-18 LAB — MAGNESIUM: Magnesium: 2 mg/dL (ref 1.7–2.4)

## 2023-02-18 LAB — PHOSPHORUS: Phosphorus: 2.6 mg/dL (ref 2.5–4.6)

## 2023-02-18 MED ORDER — POTASSIUM CHLORIDE CRYS ER 20 MEQ PO TBCR
40.0000 meq | EXTENDED_RELEASE_TABLET | Freq: Once | ORAL | Status: AC
  Administered 2023-02-18: 40 meq via ORAL
  Filled 2023-02-18: qty 2

## 2023-02-18 MED ORDER — POTASSIUM CHLORIDE CRYS ER 20 MEQ PO TBCR
20.0000 meq | EXTENDED_RELEASE_TABLET | Freq: Once | ORAL | Status: AC
  Administered 2023-02-18: 20 meq via ORAL
  Filled 2023-02-18: qty 1

## 2023-02-18 MED ORDER — MAGNESIUM SULFATE 2 GM/50ML IV SOLN
2.0000 g | Freq: Once | INTRAVENOUS | Status: AC
  Administered 2023-02-18: 2 g via INTRAVENOUS
  Filled 2023-02-18: qty 50

## 2023-02-18 MED ORDER — K PHOS MONO-SOD PHOS DI & MONO 155-852-130 MG PO TABS
250.0000 mg | ORAL_TABLET | Freq: Two times a day (BID) | ORAL | Status: AC
  Administered 2023-02-18 – 2023-02-19 (×4): 250 mg via ORAL
  Filled 2023-02-18 (×4): qty 1

## 2023-02-18 NOTE — Plan of Care (Signed)

## 2023-02-18 NOTE — Progress Notes (Signed)
PROGRESS NOTE    Stephen Carroll  [id number] DOB: 1966/08/14 DOA: 02/15/2023 PCP: Rise Patience, DO    Chief Complaint  Patient presents with   Fall   Alcohol Intoxication    Brief Narrative:   57 year old male with a known history of chronic alcohol abuse along with marijuana use who presents with altered mental status, lactic acidosis, chronic hyponatremia patient admitted to ICU due to his lactic acidosis and his history of seizures he will be admitted to the intensive care unit for close monitoring.  Fell opacity related to alcohol withdrawal with severe DTs, and was transferred to medical floor 3/9, on tube feed, mentation continues to improve  Assessment & Plan:    Hyponatremia -Likely due to beer potomania, improving slowly.,  129 today continue to monitor on IV NS.   Lactic acidosis -Improved with IV hydration, resolved, negative septic workup, off antibiotics  Alcohol abuse/severe withdrawal with DT -Continue with CIWA protocol -Continue with phenobarbital taper -Continue with IV thiamine  Acute toxic encephalopathy due to alcohol withdrawals -Neurology input greatly appreciated -Continue CIWA, IV thiamine, multivitamins -Patient improving gradually, but remains confused not at baseline.   Rhabdomyolysis -CK trending down, continue with IV fluid   Severe protein energy malnutrition Deconditioning Falls Hypophosphatemia Hypokalemia -PT/OT consulted -Tolerating oral diet, will hold his tube feed, and if intake is appropriate will DC his feeding tube -Need to closely for refeeding syndrome, meanwhile continue with Neutra-Phos and phosphorus level daily -Monitor electrolytes closely and replete as needed   Anemia, chronic Thrombocytopenia Likely 2/2 chronic alcohol abuse. -Transfuse if Hgb <7  DVT prophylaxis: SCD Code Status: Full Family Communication: None at bedside Disposition:   Status is: Inpatient    Consultants:   PCCM Neurology   Subjective:  No significant events overnight, he denies any complaints today.  Objective: Vitals:   02/18/23 0000 02/18/23 0400 02/18/23 0500 02/18/23 0800  BP: 125/76 (!) 149/84  (!) 156/81  Pulse: (!) 104 98  96  Resp: '20 20  20  '$ Temp: 98.4 F (36.9 C) 98.2 F (36.8 C)  98.9 F (37.2 C)  TempSrc: Oral Oral  Oral  SpO2: 100% 99%  100%  Weight:   67.8 kg   Height:        Intake/Output Summary (Last 24 hours) at 02/18/2023 1008 Last data filed at 02/18/2023 0600 Gross per 24 hour  Intake 2024.23 ml  Output 650 ml  Net 1374.23 ml   Filed Weights   02/16/23 0430 02/17/23 0400 02/18/23 0500  Weight: 62.4 kg 67.4 kg 67.8 kg    Examination:  Awake Alert, Oriented X 2, frail, deconditioned, he remains confused, but more appropriate and conversant today.  Symmetrical Chest wall movement, Good air movement bilaterally, CTAB RRR,No Gallops,Rubs or new Murmurs, No Parasternal Heave +ve B.Sounds, Abd Soft, No tenderness, No rebound - guarding or rigidity. No Cyanosis, Clubbing or edema, No new Rash or bruise      Data Reviewed: I have personally reviewed following labs and imaging studies  CBC: Recent Labs  Lab 02/13/23 1246 02/15/23 0320 02/15/23 0840 02/15/23 1109 02/16/23 0742 02/17/23 0921 02/18/23 0538  WBC 2.7* 4.4  --  PATIENT IDENTIFICATION ERROR. PLEASE DISREGARD RESULTS. ACCOUNT WILL BE CREDITED. 5.5 4.0 4.3  NEUTROABS 1.6*  --   --   --  4.1  --   --   HGB 9.7* 12.9* 11.9* PATIENT IDENTIFICATION ERROR. PLEASE DISREGARD RESULTS. ACCOUNT WILL BE CREDITED. 11.0* 10.1* 8.9*  HCT 28.1* 36.1* 35.0* PATIENT IDENTIFICATION ERROR.  PLEASE DISREGARD RESULTS. ACCOUNT WILL BE CREDITED. 32.3* 28.7* 25.5*  MCV 78.9* 76.5*  --  PATIENT IDENTIFICATION ERROR. PLEASE DISREGARD RESULTS. ACCOUNT WILL BE CREDITED. 79.6* 77.8* 77.5*  PLT 86* 98*  --  PATIENT IDENTIFICATION ERROR. PLEASE DISREGARD RESULTS. ACCOUNT WILL BE CREDITED. 80* 73* 80*    Basic  Metabolic Panel: Recent Labs  Lab 02/15/23 1109 02/15/23 1517 02/16/23 0742 02/16/23 1504 02/16/23 2255 02/17/23 0921 02/17/23 1819 02/18/23 0538  NA  --    < > 125* 123* 125* 127*  --  129*  K  --    < > 3.6 2.9* 3.0* 2.9*  --  3.2*  CL  --    < > 90* 90* 95* 93*  --  99  CO2  --    < > 20* 20* 24 26  --  22  GLUCOSE  --    < > 84 127* 116* 108*  --  121*  BUN  --    < > '6 7 7 '$ <5*  --  5*  CREATININE  --    < > 0.67 0.66 0.55* 0.67  --  0.52*  CALCIUM  --    < > 8.3* 8.1* 8.0* 8.0*  --  8.1*  MG 2.2  --  1.7 2.2  --  2.0 2.0 2.0  PHOS 3.2  --   --  1.1*  --  1.2* 3.1 2.6   < > = values in this interval not displayed.    GFR: Estimated Creatinine Clearance: 100.1 mL/min (A) (by C-G formula based on SCr of 0.52 mg/dL (L)).  Liver Function Tests: Recent Labs  Lab 02/13/23 1246 02/15/23 0320 02/17/23 0921 02/18/23 0538  AST 86* 124* 71* 52*  ALT 36 44 28 23  ALKPHOS 46 61 47 43  BILITOT 0.6 1.4* 0.7 0.7  PROT 7.7 9.5* 7.0 6.3*  ALBUMIN 3.2* 3.7 2.7* 2.4*    CBG: Recent Labs  Lab 02/16/23 1549 02/17/23 0835 02/17/23 1156 02/17/23 1539 02/18/23 0810  GLUCAP 130* 135* 140* 133* 125*     Recent Results (from the past 240 hour(s))  Resp panel by RT-PCR (RSV, Flu A&B, Covid) Anterior Nasal Swab     Status: None   Collection Time: 02/13/23  1:46 PM   Specimen: Anterior Nasal Swab  Result Value Ref Range Status   SARS Coronavirus 2 by RT PCR NEGATIVE NEGATIVE Final    Comment: (NOTE) SARS-CoV-2 target nucleic acids are NOT DETECTED.  The SARS-CoV-2 RNA is generally detectable in upper respiratory specimens during the acute phase of infection. The lowest concentration of SARS-CoV-2 viral copies this assay can detect is 138 copies/mL. A negative result does not preclude SARS-Cov-2 infection and should not be used as the sole basis for treatment or other patient management decisions. A negative result may occur with  improper specimen collection/handling,  submission of specimen other than nasopharyngeal swab, presence of viral mutation(s) within the areas targeted by this assay, and inadequate number of viral copies(<138 copies/mL). A negative result must be combined with clinical observations, patient history, and epidemiological information. The expected result is Negative.  Fact Sheet for Patients:  EntrepreneurPulse.com.au  Fact Sheet for Healthcare Providers:  IncredibleEmployment.be  This test is no t yet approved or cleared by the [place] FDA and  has been authorized for detection and/or diagnosis of SARS-CoV-2 by FDA under an Emergency Use Authorization (EUA). This EUA will remain  in effect (meaning this test can be used) for the duration of the  COVID-19 declaration under Section 564(b)(1) of the Act, 21 U.S.C.section 360bbb-3(b)(1), unless the authorization is terminated  or revoked sooner.       Influenza A by PCR NEGATIVE NEGATIVE Final   Influenza B by PCR NEGATIVE NEGATIVE Final    Comment: (NOTE) The Xpert Xpress SARS-CoV-2/FLU/RSV plus assay is intended as an aid in the diagnosis of influenza from Nasopharyngeal swab specimens and should not be used as a sole basis for treatment. Nasal washings and aspirates are unacceptable for Xpert Xpress SARS-CoV-2/FLU/RSV testing.  Fact Sheet for Patients: EntrepreneurPulse.com.au  Fact Sheet for Healthcare Providers: IncredibleEmployment.be  This test is not yet approved or cleared by the [place] FDA and has been authorized for detection and/or diagnosis of SARS-CoV-2 by FDA under an Emergency Use Authorization (EUA). This EUA will remain in effect (meaning this test can be used) for the duration of the COVID-19 declaration under Section 564(b)(1) of the Act, 21 U.S.C. section 360bbb-3(b)(1), unless the authorization is terminated or revoked.     Resp Syncytial Virus by PCR NEGATIVE  NEGATIVE Final    Comment: (NOTE) Fact Sheet for Patients: EntrepreneurPulse.com.au  Fact Sheet for Healthcare Providers: IncredibleEmployment.be  This test is not yet approved or cleared by the [place] FDA and has been authorized for detection and/or diagnosis of SARS-CoV-2 by FDA under an Emergency Use Authorization (EUA). This EUA will remain in effect (meaning this test can be used) for the duration of the COVID-19 declaration under Section 564(b)(1) of the Act, 21 U.S.C. section 360bbb-3(b)(1), unless the authorization is terminated or revoked.  Performed at [facility], [place] [address] [place], [place] [address]   Culture, blood (single)     Status: None (Preliminary result)   Collection Time: 02/15/23  3:16 AM   Specimen: BLOOD  Result Value Ref Range Status   Specimen Description BLOOD BLOOD RIGHT HAND  Final   Special Requests   Final    BOTTLES DRAWN AEROBIC AND ANAEROBIC Blood Culture results may not be optimal due to an inadequate volume of blood received in culture bottles   Culture   Final    NO GROWTH 3 DAYS Performed at [facility], [place] [address] [place], [place] [address]    Report Status PENDING  Incomplete  MRSA Next Gen by PCR, Nasal     Status: None   Collection Time: 02/15/23  9:49 AM   Specimen: Nasal Mucosa; Nasal Swab  Result Value Ref Range Status   MRSA by PCR Next Gen NOT DETECTED NOT DETECTED Final    Comment: (NOTE) The GeneXpert MRSA Assay (FDA approved for NASAL specimens only), is one component of a comprehensive MRSA colonization surveillance program. It is not intended to diagnose MRSA infection nor to guide or monitor treatment for MRSA infections. Test performance is not FDA approved in patients less than 48 years old. Performed at [facility], [place] [address] [place], [place] [address]          Radiology Studies: DG Abd Portable 1V  Result Date:  02/16/2023 CLINICAL DATA:  NG tube placement. EXAM: PORTABLE ABDOMEN - 1 VIEW COMPARISON:  05/22/2016. FINDINGS: Feeding tube tip projects over the distal stomach. No free air under the diaphragm. Unremarkable bowel-gas pattern. IMPRESSION: Feeding tube tip projects over the distal stomach. Electronically Signed   By: Emmit Alexanders M.D.   On: 02/16/2023 11:18        Scheduled Meds:  Chlorhexidine Gluconate Cloth  6 each Topical Daily  feeding supplement (PROSource TF20)  60 mL Per Tube Daily   folic acid  1 mg Oral Daily   heparin  5,000 Units Subcutaneous Q8H   multivitamin with minerals  1 tablet Oral Daily   phenobarbital  64.8 mg Oral Q8H   Followed by   Derrill Memo ON 02/19/2023] phenobarbital  32.4 mg Oral Q8H   phosphorus  250 mg Oral BID   Continuous Infusions:  sodium chloride 75 mL/hr at 02/17/23 1630   feeding supplement (OSMOLITE 1.5 CAL) 50 mL/hr at 02/18/23 0600   magnesium sulfate bolus IVPB     thiamine (VITAMIN B1) injection       LOS: 3 days     Phillips Climes, MD [facility]   To contact the attending provider between 7A-7P or the covering provider during after hours 7P-7A, please log into the web site [email or web address] and access using universal [place] password for that web site. If you do not have the password, please call the hospital operator.  02/18/2023, 10:08 AM

## 2023-02-19 DIAGNOSIS — R4182 Altered mental status, unspecified: Secondary | ICD-10-CM | POA: Diagnosis not present

## 2023-02-19 LAB — CBC
HCT: 24.9 % — ABNORMAL LOW (ref 39.0–52.0)
Hemoglobin: 8.9 g/dL — ABNORMAL LOW (ref 13.0–17.0)
MCH: 27.6 pg (ref 26.0–34.0)
MCHC: 35.7 g/dL (ref 30.0–36.0)
MCV: 77.1 fL — ABNORMAL LOW (ref 80.0–100.0)
Platelets: 106 10*3/uL — ABNORMAL LOW (ref 150–400)
RBC: 3.23 MIL/uL — ABNORMAL LOW (ref 4.22–5.81)
RDW: 16.5 % — ABNORMAL HIGH (ref 11.5–15.5)
WBC: 5.2 10*3/uL (ref 4.0–10.5)
nRBC: 0 % (ref 0.0–0.2)

## 2023-02-19 LAB — BASIC METABOLIC PANEL
Anion gap: 10 (ref 5–15)
BUN: <5 mg/dL — ABNORMAL LOW (ref 6–20)
CO2: 21 mmol/L — ABNORMAL LOW (ref 22–32)
Calcium: 8.1 mg/dL — ABNORMAL LOW (ref 8.9–10.3)
Chloride: 97 mmol/L — ABNORMAL LOW (ref 98–111)
Creatinine, Ser: 0.52 mg/dL — ABNORMAL LOW (ref 0.61–1.24)
GFR, Estimated: >60 mL/min (ref >60)
Glucose, Bld: 97 mg/dL (ref 70–99)
Potassium: 3.1 mmol/L — ABNORMAL LOW (ref 3.5–5.1)
Sodium: 128 mmol/L — ABNORMAL LOW (ref 135–145)

## 2023-02-19 LAB — VITAMIN B1: Vitamin B1 (Thiamine): 101.1 nmol/L (ref 66.5–200.0)

## 2023-02-19 LAB — GLUCOSE, CAPILLARY
Glucose-Capillary: 137 mg/dL — ABNORMAL HIGH (ref 70–99)
Glucose-Capillary: 124 mg/dL — ABNORMAL HIGH (ref 70–99)
Glucose-Capillary: 121 mg/dL — ABNORMAL HIGH (ref 70–99)

## 2023-02-19 LAB — PHOSPHORUS: Phosphorus: 2.9 mg/dL (ref 2.5–4.6)

## 2023-02-19 MED ORDER — METOPROLOL TARTRATE 25 MG PO TABS
25.0000 mg | ORAL_TABLET | Freq: Two times a day (BID) | ORAL | Status: DC
  Administered 2023-02-19 – 2023-02-20 (×3): 25 mg via ORAL
  Filled 2023-02-19 (×3): qty 1

## 2023-02-19 MED ORDER — POTASSIUM CHLORIDE CRYS ER 20 MEQ PO TBCR
40.0000 meq | EXTENDED_RELEASE_TABLET | ORAL | Status: AC
  Administered 2023-02-19 (×2): 40 meq via ORAL
  Filled 2023-02-19 (×2): qty 2

## 2023-02-19 NOTE — Plan of Care (Signed)

## 2023-02-19 NOTE — Progress Notes (Signed)
Occupational Therapy Treatment Patient Details Name: Stephen Carroll MRN: [id number] DOB: Oct 17, 1966 Today's Date: 02/19/2023   History of present illness Pt is 57 year old presented to [facility] on  02/15/23 after a fall, lactic acidosis, AMS. Suspected ETOH withdrawals with DT's. PMH - EtOH abuse, cerebral aneurysm, alcohol withdrawal seizures and alcoholic cirrhosis with ascites, toe amputation   OT comments  Pt alert and up in chair making appropriate conversation. Demonstrating ability to donn sock with set up, change gown with min assist and walk in room stabilizing on IV pole with min guard assist. HR to 150 limiting activity, pt asymptomatic. Pt reports having 24 hour care at home. Updated d/c recommendation to Stephen Carroll.    Recommendations for follow up therapy are one component of a multi-disciplinary discharge planning process, led by the attending physician.  Recommendations may be updated based on patient status, additional functional criteria and insurance authorization.    Follow Up Recommendations  Home health OT     Assistance Recommended at Discharge Frequent or constant Supervision/Assistance  Patient can return home with the following  A little help with walking and/or transfers;A little help with bathing/dressing/bathroom;Assistance with cooking/housework;Direct supervision/assist for medications management;Direct supervision/assist for financial management;Assist for transportation;Help with stairs or ramp for entrance   Equipment Recommendations  None recommended by OT    Recommendations for Other Services      Precautions / Restrictions Precautions Precautions: Fall Precaution Comments: watch HR Restrictions Weight Bearing Restrictions: No       Mobility Bed Mobility               General bed mobility comments: in chair    Transfers Overall transfer level: Needs assistance Equipment used: None Transfers: Sit to/from Stand Sit to Stand: Min guard            General transfer comment: no physical assist, stabilized on IV pole     Balance Overall balance assessment: Needs assistance   Sitting balance-Leahy Scale: Good       Standing balance-Leahy Scale: Fair                             ADL either performed or assessed with clinical judgement   ADL   Eating/Feeding: Independent;Sitting                   Lower Body Dressing: Set up;Sitting/lateral leans Lower Body Dressing Details (indicate cue type and reason): no LOB with donning sock                    Extremity/Trunk Assessment              Vision       Perception     Praxis      Cognition Arousal/Alertness: Awake/alert Behavior During Therapy: WFL for tasks assessed/performed Overall Cognitive Status: Impaired/Different from baseline Area of Impairment: Orientation, Problem solving, Safety/judgement                 Orientation Level: Disoriented to, Situation       Safety/Judgement: Decreased awareness of safety, Decreased awareness of deficits Awareness: Intellectual            Exercises      Shoulder Instructions       General Comments      Pertinent Vitals/ Pain       Pain Assessment Pain Assessment: No/denies pain  Home Living  Prior Functioning/Environment              Frequency  Min 2X/week        Progress Toward Goals  OT Goals(current goals can now be found in the care plan section)  Progress towards OT goals: Progressing toward goals  Acute Rehab OT Goals OT Goal Formulation: With patient Time For Goal Achievement: 03/02/23 Potential to Achieve Goals: Good  Plan Discharge plan needs to be updated    Co-evaluation                 AM-PAC OT "6 Clicks" Daily Activity     Outcome Measure   Help from another person eating meals?: None Help from another person taking care of personal grooming?: A Little Help  from another person toileting, which includes using toliet, bedpan, or urinal?: A Little Help from another person bathing (including washing, rinsing, drying)?: A Little Help from another person to put on and taking off regular upper body clothing?: A Little Help from another person to put on and taking off regular lower body clothing?: A Little 6 Click Score: 19    End of Session    OT Visit Diagnosis: Unsteadiness on feet (R26.81)   Activity Tolerance Patient tolerated treatment well   Patient Left in chair;with call bell/phone within reach;with chair alarm set   Nurse Communication Other (comment) (IV leaking, HR 150)        Time: [id number] OT Time Calculation (min): 27 min  Charges: OT General Charges $OT Visit: 1 Visit OT Treatments $Self Care/Home Management : 23-37 mins  Cleta Alberts, OTR/L Acute Rehabilitation Services Office: [phone]  Malka So 02/19/2023, 10:56 AM

## 2023-02-19 NOTE — Progress Notes (Addendum)
PROGRESS NOTE    Stephen Carroll  [id number] DOB: Sep 01, 1966 DOA: 02/15/2023 PCP: Rise Patience, DO   Brief Narrative:  57 year old male with a known history of chronic alcohol abuse along with marijuana use who presents with altered mental status, lactic acidosis, chronic hyponatremia patient admitted to ICU due to his lactic acidosis and his history of seizures he will be admitted to the intensive care unit for close monitoring.  Fell opacity related to alcohol withdrawal with severe DTs, and was transferred to medical floor 3/9, on tube feed, mentation continues to improve   Assessment & Plan:   Principal Problem:   AMS (altered mental status) Active Problems:   Protein-calorie malnutrition, severe  Chronic hyponatremia -Likely due to beer potomania, improving slowly.  128 today.  Lactic acidosis -Improved with IV hydration, resolved, negative septic workup, off antibiotics   Alcohol abuse/severe withdrawal with DT -Continue with CIWA protocol.  No withdrawal symptoms.  He has not required any as needed Ativan. -Continue with phenobarbital taper -Continue with IV thiamine   Acute toxic encephalopathy due to alcohol withdrawals -Neurology input greatly appreciated -Continue CIWA, IV thiamine, multivitamins -Patient improving gradually, but remains confused not at baseline.  Likely due to chronic alcohol consumption.   Rhabdomyolysis -CK trending down, will stop IV fluids.   Severe protein energy malnutrition Deconditioning Falls Hypophosphatemia Hypokalemia -PT/OT consulted -Tube feeds were started.  Tolerating oral diet.  -Need to closely for refeeding syndrome, meanwhile continue with Neutra-Phos and phosphorus level daily -Monitor electrolytes closely and replete as needed.  Per nursing, he ate only 25% of the breakfast today.  Will consult dietitian.   Anemia, chronic Thrombocytopenia Likely 2/2 chronic alcohol abuse. -Transfuse if Hgb <7  Sinus  tachycardia: Patient's heart rate remains around 130.  He is asymptomatic.  No chest pain, palpitation or shortness of breath.  Appears anxious slightly.  He is eager to go home.  I will start him on Lopressor 25 mg p.o. twice daily.  Monitor for 24 hours.  DVT prophylaxis: Place and maintain sequential compression device Start: 02/17/23 1108 heparin injection 5,000 Units Start: 02/15/23 0900   Code Status: Full Code  Family Communication:  None present at bedside.  Plan of care discussed with patient in length and he/she verbalized understanding and agreed with it.  Status is: Inpatient Remains inpatient appropriate because: Tachycardic.   Estimated body mass index is 22.73 kg/m as calculated from the following:   Height as of this encounter: '5\' 8"'$  (1.727 m).   Weight as of this encounter: 67.8 kg.  Pressure Injury 02/15/23 Sacrum Medial Stage 1 -  Intact skin with non-blanchable redness of a localized area usually over a bony prominence. (Active)  02/15/23 1000  Location: Sacrum  Location Orientation: Medial  Staging: Stage 1 -  Intact skin with non-blanchable redness of a localized area usually over a bony prominence.  Wound Description (Comments):   Present on Admission: Yes  Dressing Type Foam - Lift dressing to assess site every shift 02/19/23 0800   Nutritional Assessment: Body mass index is 22.73 kg/m.Marland Kitchen Seen by dietician.  I agree with the assessment and plan as outlined below: Nutrition Status: Nutrition Problem: Severe Malnutrition Etiology: social / environmental circumstances (EtOH abuse) Signs/Symptoms: severe fat depletion, severe muscle depletion Interventions: Refer to RD note for recommendations, MVI, Tube feeding  . Skin Assessment: I have examined the patient's skin and I agree with the wound assessment as performed by the wound care RN as outlined below: Pressure Injury 02/15/23 Sacrum  Medial Stage 1 -  Intact skin with non-blanchable redness of a localized  area usually over a bony prominence. (Active)  02/15/23 1000  Location: Sacrum  Location Orientation: Medial  Staging: Stage 1 -  Intact skin with non-blanchable redness of a localized area usually over a bony prominence.  Wound Description (Comments):   Present on Admission: Yes  Dressing Type Foam - Lift dressing to assess site every shift 02/19/23 0800    Consultants:  Neurology-signed off  Procedures:  None  Antimicrobials:  Anti-infectives (From admission, onward)    Start     Dose/Rate Route Frequency Ordered Stop   02/15/23 03/25/2099  vancomycin (VANCOCIN) IVPB 1000 mg/200 mL premix  Status:  Discontinued        1,000 mg 200 mL/hr over 60 Minutes Intravenous Every 12 hours 02/15/23 0904 02/16/23 0950   02/15/23 0900  vancomycin (VANCOREADY) IVPB 1500 mg/300 mL        1,500 mg 150 mL/hr over 120 Minutes Intravenous  Once 02/15/23 0857 02/15/23 1150   02/15/23 0900  ceFEPIme (MAXIPIME) 2 g in sodium chloride 0.9 % 100 mL IVPB  Status:  Discontinued        2 g 200 mL/hr over 30 Minutes Intravenous Every 8 hours 02/15/23 0857 02/16/23 0950   02/15/23 0430  cefTRIAXone (ROCEPHIN) 2 g in sodium chloride 0.9 % 100 mL IVPB        2 g 200 mL/hr over 30 Minutes Intravenous  Once 02/15/23 0417 02/15/23 0502         Subjective: Patient seen and examined.  He has no complaints.  Objective: Vitals:   02/18/23 2347 02/19/23 0000 02/19/23 0400 02/19/23 0800  BP:  133/80 134/75 137/80  Pulse:  87 84 86  Resp:    14  Temp: 98.2 F (36.8 C)  97.9 F (36.6 C) 98.2 F (36.8 C)  TempSrc: Oral  Oral Oral  SpO2:   100% 100%  Weight:      Height:        Intake/Output Summary (Last 24 hours) at 02/19/2023 1118 Last data filed at 02/19/2023 0514 Gross per 24 hour  Intake 720 ml  Output 1350 ml  Net -630 ml   Filed Weights   02/16/23 0430 02/17/23 0400 02/18/23 0500  Weight: 62.4 kg 67.4 kg 67.8 kg    Examination:  General exam: Appears calm and comfortable  Respiratory  system: Clear to auscultation. Respiratory effort normal. Cardiovascular system: S1 & S2 heard, RRR. No JVD, murmurs, rubs, gallops or clicks. No pedal edema. Gastrointestinal system: Abdomen is nondistended, soft and nontender. No organomegaly or masses felt. Normal bowel sounds heard. Central nervous system: Alert and oriented x 2.  He was unable to tell me the month or the year and he blames this to the [facility] that died in 03/25/1988. No focal neurological deficits. Extremities: Symmetric 5 x 5 power. Skin: No rashes, lesions or ulcers Psychiatry: Judgement and insight appear poor.  Data Reviewed: I have personally reviewed following labs and imaging studies  CBC: Recent Labs  Lab 02/13/23 1246 02/15/23 0320 02/15/23 1109 02/16/23 0742 02/17/23 0921 02/18/23 0538 02/19/23 0214  WBC 2.7*   < > PATIENT IDENTIFICATION ERROR. PLEASE DISREGARD RESULTS. ACCOUNT WILL BE CREDITED. 5.5 4.0 4.3 5.2  NEUTROABS 1.6*  --   --  4.1  --   --   --   HGB 9.7*   < > PATIENT IDENTIFICATION ERROR. PLEASE DISREGARD RESULTS. ACCOUNT WILL BE CREDITED. 11.0* 10.1* 8.9* 8.9*  HCT 28.1*   < > PATIENT IDENTIFICATION ERROR. PLEASE DISREGARD RESULTS. ACCOUNT WILL BE CREDITED. 32.3* 28.7* 25.5* 24.9*  MCV 78.9*   < > PATIENT IDENTIFICATION ERROR. PLEASE DISREGARD RESULTS. ACCOUNT WILL BE CREDITED. 79.6* 77.8* 77.5* 77.1*  PLT 86*   < > PATIENT IDENTIFICATION ERROR. PLEASE DISREGARD RESULTS. ACCOUNT WILL BE CREDITED. 80* 73* 80* 106*   < > = values in this interval not displayed.   Basic Metabolic Panel: Recent Labs  Lab 02/16/23 0742 02/16/23 1504 02/16/23 2255 02/17/23 0921 02/17/23 1819 02/18/23 0538 02/19/23 0214  NA 125* 123* 125* 127*  --  129* 128*  K 3.6 2.9* 3.0* 2.9*  --  3.2* 3.1*  CL 90* 90* 95* 93*  --  99 97*  CO2 20* 20* 24 26  --  22 21*  GLUCOSE 84 127* 116* 108*  --  121* 97  BUN '6 7 7 '$ <5*  --  5* <5*  CREATININE 0.67 0.66 0.55* 0.67  --  0.52* 0.52*  CALCIUM 8.3* 8.1* 8.0* 8.0*  --  8.1*  8.1*  MG 1.7 2.2  --  2.0 2.0 2.0  --   PHOS  --  1.1*  --  1.2* 3.1 2.6 2.9   GFR: Estimated Creatinine Clearance: 100.1 mL/min (A) (by C-G formula based on SCr of 0.52 mg/dL (L)). Liver Function Tests: Recent Labs  Lab 02/13/23 1246 02/15/23 0320 02/17/23 0921 02/18/23 0538  AST 86* 124* 71* 52*  ALT 36 44 28 23  ALKPHOS 46 61 47 43  BILITOT 0.6 1.4* 0.7 0.7  PROT 7.7 9.5* 7.0 6.3*  ALBUMIN 3.2* 3.7 2.7* 2.4*   Recent Labs  Lab 02/13/23 1246  LIPASE 28   Recent Labs  Lab 02/13/23 1246 02/15/23 0438  AMMONIA 20 32   Coagulation Profile: Recent Labs  Lab 02/15/23 0320  INR 1.4*   Cardiac Enzymes: Recent Labs  Lab 02/15/23 0320 02/16/23 0732 02/17/23 0921  CKTOTAL 1,761* 885* 519*   BNP (last 3 results) No results for input(s): "PROBNP" in the last 8760 hours. HbA1C: No results for input(s): "HGBA1C" in the last 72 hours. CBG: Recent Labs  Lab 02/17/23 1539 02/17/23 1938 02/18/23 0810 02/18/23 1201 02/18/23 1626  GLUCAP 133* 137* 125* 122* 105*   Lipid Profile: No results for input(s): "CHOL", "HDL", "LDLCALC", "TRIG", "CHOLHDL", "LDLDIRECT" in the last 72 hours. Thyroid Function Tests: No results for input(s): "TSH", "T4TOTAL", "FREET4", "T3FREE", "THYROIDAB" in the last 72 hours. Anemia Panel: No results for input(s): "VITAMINB12", "FOLATE", "FERRITIN", "TIBC", "IRON", "RETICCTPCT" in the last 72 hours. Sepsis Labs: Recent Labs  Lab 02/15/23 0320 02/15/23 0855 02/15/23 1109 02/15/23 1517  LATICACIDVEN 4.6* 1.6 1.8 1.9    Recent Results (from the past 240 hour(s))  Resp panel by RT-PCR (RSV, Flu A&B, Covid) Anterior Nasal Swab     Status: None   Collection Time: 02/13/23  1:46 PM   Specimen: Anterior Nasal Swab  Result Value Ref Range Status   SARS Coronavirus 2 by RT PCR NEGATIVE NEGATIVE Final    Comment: (NOTE) SARS-CoV-2 target nucleic acids are NOT DETECTED.  The SARS-CoV-2 RNA is generally detectable in upper  respiratory specimens during the acute phase of infection. The lowest concentration of SARS-CoV-2 viral copies this assay can detect is 138 copies/mL. A negative result does not preclude SARS-Cov-2 infection and should not be used as the sole basis for treatment or other patient management decisions. A negative result may occur with  improper specimen collection/handling, submission of specimen  other than nasopharyngeal swab, presence of viral mutation(s) within the areas targeted by this assay, and inadequate number of viral copies(<138 copies/mL). A negative result must be combined with clinical observations, patient history, and epidemiological information. The expected result is Negative.  Fact Sheet for Patients:  EntrepreneurPulse.com.au  Fact Sheet for Healthcare Providers:  IncredibleEmployment.be  This test is no t yet approved or cleared by the [place] FDA and  has been authorized for detection and/or diagnosis of SARS-CoV-2 by FDA under an Emergency Use Authorization (EUA). This EUA will remain  in effect (meaning this test can be used) for the duration of the COVID-19 declaration under Section 564(b)(1) of the Act, 21 U.S.C.section 360bbb-3(b)(1), unless the authorization is terminated  or revoked sooner.       Influenza A by PCR NEGATIVE NEGATIVE Final   Influenza B by PCR NEGATIVE NEGATIVE Final    Comment: (NOTE) The Xpert Xpress SARS-CoV-2/FLU/RSV plus assay is intended as an aid in the diagnosis of influenza from Nasopharyngeal swab specimens and should not be used as a sole basis for treatment. Nasal washings and aspirates are unacceptable for Xpert Xpress SARS-CoV-2/FLU/RSV testing.  Fact Sheet for Patients: EntrepreneurPulse.com.au  Fact Sheet for Healthcare Providers: IncredibleEmployment.be  This test is not yet approved or cleared by the [place] FDA and has been  authorized for detection and/or diagnosis of SARS-CoV-2 by FDA under an Emergency Use Authorization (EUA). This EUA will remain in effect (meaning this test can be used) for the duration of the COVID-19 declaration under Section 564(b)(1) of the Act, 21 U.S.C. section 360bbb-3(b)(1), unless the authorization is terminated or revoked.     Resp Syncytial Virus by PCR NEGATIVE NEGATIVE Final    Comment: (NOTE) Fact Sheet for Patients: EntrepreneurPulse.com.au  Fact Sheet for Healthcare Providers: IncredibleEmployment.be  This test is not yet approved or cleared by the [place] FDA and has been authorized for detection and/or diagnosis of SARS-CoV-2 by FDA under an Emergency Use Authorization (EUA). This EUA will remain in effect (meaning this test can be used) for the duration of the COVID-19 declaration under Section 564(b)(1) of the Act, 21 U.S.C. section 360bbb-3(b)(1), unless the authorization is terminated or revoked.  Performed at [facility], [place] [address] [place], [place] [address]   Culture, blood (single)     Status: None (Preliminary result)   Collection Time: 02/15/23  3:16 AM   Specimen: BLOOD  Result Value Ref Range Status   Specimen Description BLOOD BLOOD RIGHT HAND  Final   Special Requests   Final    BOTTLES DRAWN AEROBIC AND ANAEROBIC Blood Culture results may not be optimal due to an inadequate volume of blood received in culture bottles   Culture   Final    NO GROWTH 3 DAYS Performed at [facility], [place] [address], [place],  [address]    Report Status PENDING  Incomplete  MRSA Next Gen by PCR, Nasal     Status: None   Collection Time: 02/15/23  9:49 AM   Specimen: Nasal Mucosa; Nasal Swab  Result Value Ref Range Status   MRSA by PCR Next Gen NOT DETECTED NOT DETECTED Final    Comment: (NOTE) The GeneXpert MRSA Assay (FDA approved for NASAL specimens only), is one component of a  comprehensive MRSA colonization surveillance program. It is not intended to diagnose MRSA infection nor to guide or monitor treatment for MRSA infections. Test performance is not FDA approved in patients less than 45 years old.  Performed at [facility], [place] [address] [place], [place] [address]      Radiology Studies: No results found.  Scheduled Meds:  Chlorhexidine Gluconate Cloth  6 each Topical Daily   feeding supplement (PROSource TF20)  60 mL Per Tube Daily   folic acid  1 mg Oral Daily   heparin  5,000 Units Subcutaneous Q8H   metoprolol tartrate  25 mg Oral BID   multivitamin with minerals  1 tablet Oral Daily   phenobarbital  32.4 mg Oral Q8H   phosphorus  250 mg Oral BID   potassium chloride  40 mEq Oral Q4H   Continuous Infusions:  sodium chloride 50 mL/hr at 02/18/23 1030   thiamine (VITAMIN B1) injection 250 mg (02/19/23 1000)     LOS: 4 days   Darliss Cheney, MD [facility]  02/19/2023, 11:18 AM   *Please note that this is a verbal dictation therefore any spelling or grammatical errors are due to the "[place] One" system interpretation.  Please page via [place] and do not message via secure chat for urgent patient care matters. Secure chat can be used for non urgent patient care matters.  How to contact the [facility] Attending or Consulting provider [place] or covering provider during after hours [place], for this patient?  Check the care team in [facility] and look for a) attending/consulting TRH provider listed and b) the [facility] team listed. Page or secure chat [phone]. Log into [email or web address] and use [place]'s universal password to access. If you do not have the password, please contact the hospital operator. Locate the [facility] provider you are looking for under [facility] and page to a number that you can be directly reached. If you still have difficulty reaching the provider, please page the [facility] (Director on Call) for the Hospitalists listed on amion  for assistance.

## 2023-02-19 NOTE — Progress Notes (Signed)
Physical Therapy Treatment Patient Details Name: Stephen Carroll MRN: [id number] DOB: 1966/04/08 Today's Date: 02/19/2023   History of Present Illness Pt is 57 year old presented to [facility] on  02/15/23 after a fall, lactic acidosis, AMS. Suspected ETOH withdrawals with DT's. PMH - EtOH abuse, cerebral aneurysm, alcohol withdrawal seizures and alcoholic cirrhosis with ascites, toe amputation    PT Comments    Pt progressing with mobility and cognition, still having deficits but improved from initial session. Pt requiring minG-minA and RW for all mobility, primarily limited by elevated HR with ambulation, however pt asymptomatic. Pt only ambulating in the room today, brief distance with a seated rest break for HR recovery, pt tolerating well with assist for balance and RW negotiation. Acute PT will continue to follow up with pt as appropriate to progress mobility and activity tolerance during admission, discharge plans remain appropriate.     Recommendations for follow up therapy are one component of a multi-disciplinary discharge planning process, led by the attending physician.  Recommendations may be updated based on patient status, additional functional criteria and insurance authorization.  Follow Up Recommendations  Home health PT     Assistance Recommended at Discharge Intermittent Supervision/Assistance  Patient can return home with the following Help with stairs or ramp for entrance   Equipment Recommendations  None recommended by PT    Recommendations for Other Services       Precautions / Restrictions Precautions Precautions: Fall Precaution Comments: watch HR Restrictions Weight Bearing Restrictions: No     Mobility  Bed Mobility Overal bed mobility: Needs Assistance Bed Mobility: Supine to Sit     Supine to sit: Min guard, HOB elevated     General bed mobility comments: use of bed rail, minG for safety and line management, pt performing without physical assist but  needing increased time    Transfers Overall transfer level: Needs assistance Equipment used: Rolling walker (2 wheels) Transfers: Sit to/from Stand Sit to Stand: Min assist, Min guard           General transfer comment: cued for hand placement, minA for first stand for power up, progressing to minG with repetition    Ambulation/Gait Ambulation/Gait assistance: Min assist Gait Distance (Feet): 15 Feet (10 feet second trial after a seated rest break) Assistive device: Rolling walker (2 wheels) Gait Pattern/deviations: Step-through pattern, Decreased stride length, Trunk flexed, Drifts right/left Gait velocity: decreased     General Gait Details: assist provided for balance and RW, difficulty navigating in room and staying within the RW's BOS. Pt's HR elevating to 140s with mobility, recovers with seated rest break, limited ambulation trial due to HR   Stairs             Wheelchair Mobility    Modified Rankin (Stroke Patients Only)       Balance Overall balance assessment: Needs assistance Sitting-balance support: No upper extremity supported, Feet supported Sitting balance-Leahy Scale: Good Sitting balance - Comments: stable sitting balance   Standing balance support: Bilateral upper extremity supported, During functional activity Standing balance-Leahy Scale: Poor Standing balance comment: standing static without UE support but requiring assist for ambulation                            Cognition Arousal/Alertness: Awake/alert Behavior During Therapy: WFL for tasks assessed/performed Overall Cognitive Status: Impaired/Different from baseline Area of Impairment: Orientation, Problem solving, Safety/judgement  Orientation Level: Disoriented to, Time       Safety/Judgement: Decreased awareness of safety, Decreased awareness of deficits Awareness: Intellectual Problem Solving: Requires verbal cues General Comments: Stating  the year as 2030 and requiring cues for safety, unaware of stool in the bed        Exercises      General Comments General comments (skin integrity, edema, etc.): HR elevating to 140s with ambulation in the room, pt asymptomatic. Recovers with seated rest break but elevates again with second trial of ambulation      Pertinent Vitals/Pain Pain Assessment Pain Assessment: No/denies pain    Home Living                          Prior Function            PT Goals (current goals can now be found in the care plan section) Acute Rehab PT Goals Patient Stated Goal: go home PT Goal Formulation: With patient Time For Goal Achievement: 03/02/23 Potential to Achieve Goals: Good Progress towards PT goals: Progressing toward goals    Frequency    Min 3X/week      PT Plan Current plan remains appropriate    Co-evaluation              AM-PAC PT "6 Clicks" Mobility   Outcome Measure  Help needed turning from your back to your side while in a flat bed without using bedrails?: A Little Help needed moving from lying on your back to sitting on the side of a flat bed without using bedrails?: A Little Help needed moving to and from a bed to a chair (including a wheelchair)?: A Little Help needed standing up from a chair using your arms (e.g., wheelchair or bedside chair)?: A Little Help needed to walk in hospital room?: A Little Help needed climbing 3-5 steps with a railing? : A Lot 6 Click Score: 17    End of Session Equipment Utilized During Treatment: Gait belt Activity Tolerance: Treatment limited secondary to medical complications (Comment) (HR elevated) Patient left: in chair;with call bell/phone within reach;with chair alarm set Nurse Communication: Mobility status PT Visit Diagnosis: Unsteadiness on feet (R26.81);Other abnormalities of gait and mobility (R26.89);Ataxic gait (R26.0)     Time: [id number] PT Time Calculation (min) (ACUTE ONLY): 24  min  Charges:  $Gait Training: 8-22 mins $Therapeutic Activity: 8-22 mins                     Charlynne Cousins, PT DPT Acute Rehabilitation Services Office [phone]    Luvenia Heller 02/19/2023, 12:52 PM

## 2023-02-20 DIAGNOSIS — L8990 Pressure ulcer of unspecified site, unspecified stage: Secondary | ICD-10-CM | POA: Insufficient documentation

## 2023-02-20 DIAGNOSIS — R4182 Altered mental status, unspecified: Secondary | ICD-10-CM | POA: Diagnosis not present

## 2023-02-20 DIAGNOSIS — R000 Tachycardia, unspecified: Secondary | ICD-10-CM | POA: Insufficient documentation

## 2023-02-20 LAB — CBC
HCT: 27.1 % — ABNORMAL LOW (ref 39.0–52.0)
Hemoglobin: 9.3 g/dL — ABNORMAL LOW (ref 13.0–17.0)
MCH: 27 pg (ref 26.0–34.0)
MCHC: 34.3 g/dL (ref 30.0–36.0)
MCV: 78.8 fL — ABNORMAL LOW (ref 80.0–100.0)
Platelets: 171 10*3/uL (ref 150–400)
RBC: 3.44 MIL/uL — ABNORMAL LOW (ref 4.22–5.81)
RDW: 16.9 % — ABNORMAL HIGH (ref 11.5–15.5)
WBC: 6.1 10*3/uL (ref 4.0–10.5)
nRBC: 0 % (ref 0.0–0.2)

## 2023-02-20 LAB — CULTURE, BLOOD (SINGLE): Culture: NO GROWTH

## 2023-02-20 NOTE — TOC Transition Note (Addendum)
Transition of Care [facility]) - CM/SW Discharge Note   Patient Details  Name: Stephen Carroll MRN: [id number] Date of Birth: 1966/08/16  Transition of Care [facility]) CM/SW Contact:  Levonne Lapping, RN Phone Number: 02/20/2023, 11:06 AM   Clinical Narrative:   Rolling walker has been ordered thru [employer] and will be delivered bedside soon   Patient will DC to home with [facility] Health from [facility]. He was current with [facility] PTA. Speech/ PT and OT will be needed. Patient's Sister to transport home   No additional TOC needs              Patient Goals and CMS Choice      Discharge Placement                         Discharge Plan and Services Additional resources added to the After Visit Summary for                                       Social Determinants of Health (SDOH) Interventions SDOH Screenings   Tobacco Use: Medium Risk (02/15/2023)     Readmission Risk Interventions     No data to display

## 2023-02-20 NOTE — Progress Notes (Signed)
Attempted to call patient's sister, Seth Bake and pt's brother, Mliss Fritz in regards to discharge from hospital. No response from either party at his time.

## 2023-02-20 NOTE — Progress Notes (Signed)
Physical Therapy Treatment Patient Details Name: Stephen Carroll MRN: [id number] DOB: 02-09-1966 Today's Date: 02/20/2023   History of Present Illness Pt is 57 year old presented to [facility] on  02/15/23 after a fall, lactic acidosis, AMS. Suspected ETOH withdrawals with DT's. PMH - EtOH abuse, cerebral aneurysm, alcohol withdrawal seizures and alcoholic cirrhosis with ascites, toe amputation    PT Comments    Pt tolerated today's session well, improved cognition and mobility today with heart rate stable throughout ambulation. Pt reports no concerns with mobility upon discharge, mild cueing needed for safety with ambulation with RW, performed brief trial without RW and pt ambulating and performing turns without LOB. Pt declined stair attempt today, reporting level entry at his home. Pt able to problem solve why he fell to cause this admission, stating steps he will take to decrease the chance of falling again. Acute PT will continue to follow up with pt during admission, discharge plans remain appropriate.     Recommendations for follow up therapy are one component of a multi-disciplinary discharge planning process, led by the attending physician.  Recommendations may be updated based on patient status, additional functional criteria and insurance authorization.  Follow Up Recommendations  Home health PT     Assistance Recommended at Discharge Intermittent Supervision/Assistance  Patient can return home with the following Help with stairs or ramp for entrance   Equipment Recommendations  None recommended by PT    Recommendations for Other Services       Precautions / Restrictions Precautions Precautions: Fall Precaution Comments: watch HR Restrictions Weight Bearing Restrictions: No     Mobility  Bed Mobility Overal bed mobility: Needs Assistance Bed Mobility: Supine to Sit, Sit to Supine     Supine to sit: Supervision, HOB elevated Sit to supine: Supervision, HOB elevated    General bed mobility comments: supervision for line management    Transfers Overall transfer level: Needs assistance Equipment used: Rolling walker (2 wheels) Transfers: Sit to/from Stand Sit to Stand: Min guard           General transfer comment: minG for line management with cueing for hand placement    Ambulation/Gait Ambulation/Gait assistance: Min guard Gait Distance (Feet): 200 Feet Assistive device: Rolling walker (2 wheels), None Gait Pattern/deviations: Step-through pattern, Decreased stride length Gait velocity: WFL     General Gait Details: primarily utilizing RW but progressing to no AD at end of session. With use of RW, pt intermittently ambulating to the side of the walker when turning 90* or more with cueing provided. Able to turn without RW with no LOB but mild imbalance noted   Stairs             Wheelchair Mobility    Modified Rankin (Stroke Patients Only)       Balance Overall balance assessment: Needs assistance Sitting-balance support: No upper extremity supported, Feet supported Sitting balance-Leahy Scale: Good Sitting balance - Comments: stable sitting balance   Standing balance support: Bilateral upper extremity supported, During functional activity, No upper extremity supported Standing balance-Leahy Scale: Fair Standing balance comment: stable standing balance without UE support and ambulating without UE support at the end of session                            Cognition Arousal/Alertness: Awake/alert Behavior During Therapy: WFL for tasks assessed/performed Overall Cognitive Status: Impaired/Different from baseline Area of Impairment: Problem solving  Problem Solving: Requires verbal cues General Comments: cognition improved from previous session, still requiring mild verbal cues for safety with mobility        Exercises      General Comments General comments (skin  integrity, edema, etc.): VSS during today's session      Pertinent Vitals/Pain Pain Assessment Pain Assessment: No/denies pain    Home Living                          Prior Function            PT Goals (current goals can now be found in the care plan section) Acute Rehab PT Goals Patient Stated Goal: go home PT Goal Formulation: With patient Time For Goal Achievement: 03/02/23 Potential to Achieve Goals: Good Progress towards PT goals: Progressing toward goals    Frequency    Min 3X/week      PT Plan Current plan remains appropriate    Co-evaluation              AM-PAC PT "6 Clicks" Mobility   Outcome Measure  Help needed turning from your back to your side while in a flat bed without using bedrails?: None Help needed moving from lying on your back to sitting on the side of a flat bed without using bedrails?: None Help needed moving to and from a bed to a chair (including a wheelchair)?: A Little Help needed standing up from a chair using your arms (e.g., wheelchair or bedside chair)?: A Little Help needed to walk in hospital room?: A Little Help needed climbing 3-5 steps with a railing? : A Lot 6 Click Score: 19    End of Session Equipment Utilized During Treatment: Gait belt Activity Tolerance: Patient tolerated treatment well Patient left: in bed;with call bell/phone within reach Nurse Communication: Mobility status PT Visit Diagnosis: Unsteadiness on feet (R26.81);Other abnormalities of gait and mobility (R26.89);Ataxic gait (R26.0)     Time: [id number] PT Time Calculation (min) (ACUTE ONLY): 14 min  Charges:  $Gait Training: 8-22 mins                     Charlynne Cousins, PT DPT Acute Rehabilitation Services Office [phone]    Luvenia Heller 02/20/2023, 4:16 PM

## 2023-02-20 NOTE — Discharge Summary (Signed)
Physician Discharge Summary  Stephen Carroll [id number] DOB: November 08, 1966 DOA: 02/15/2023  PCP: Rise Patience, DO  Admit date: 02/15/2023 Discharge date: 02/20/2023 30 Day Unplanned Readmission Risk Score    Flowsheet Row ED to Hosp-Admission (Current) from 02/15/2023 in [facility]  30 Day Unplanned Readmission Risk Score (%) 24.75 Filed at 02/20/2023 0801       This score is the patient's risk of an unplanned readmission within 30 days of being discharged (0 -100%). The score is based on dignosis, age, lab data, medications, orders, and past utilization.   Low:  0-14.9   Medium: 15-21.9   High: 22-29.9   Extreme: 30 and above          Admitted From: Home Disposition: Home  Recommendations for Outpatient Follow-up:  Follow up with PCP in 1-2 weeks Please obtain BMP/CBC in one week Please follow up with your PCP on the following pending results: Unresulted Labs (From admission, onward)     Start     Ordered   02/17/23 0500  CBC  Daily,   R     Question:  Specimen collection method  Answer:  Lab=Lab collect   02/16/23 [place]: Yes Equipment/Devices: None  Discharge Condition: Stable CODE STATUS: Full code Diet recommendation: Cardiac  Subjective: Seen and examined.  He has no complaints.  He is fully alert and mostly oriented, at his baseline.  He wants to go home.  Brief/Interim Summary: 57 year old male with a known history of chronic alcohol abuse along with marijuana use who presented with altered mental status, lactic acidosis, chronic hyponatremia patient admitted to ICU due to his lactic acidosis and his history of seizures and DTs, treated in ICU and then eventually was transferred to medical floor 3/9, on tube feed, details below.   Chronic hyponatremia -Likely due to beer potomania, improving slowly.  128 today.   Lactic acidosis -Improved with IV hydration, resolved, negative septic workup, off antibiotics    Alcohol abuse/severe withdrawal with DT -Continue with CIWA protocol.  No withdrawal symptoms.  He has not required any as needed Ativan.  He was on phenobarbital taper.   Acute toxic encephalopathy due to alcohol withdrawals -Neurology input greatly appreciated.  EEG is unremarkable.  His encephalopathy was in the setting of alcohol withdrawal.  He is fully alert and mostly oriented but this is his baseline, likely due to chronic alcoholism.   Rhabdomyolysis Resolved and treated with IV fluids.   Severe protein energy malnutrition Deconditioning Falls Hypophosphatemia Hypokalemia -PT/OT consulted -Tube feeds were started.  Which were eventually stopped and patient is consuming most of his diet.   Anemia, chronic Thrombocytopenia Likely 2/2 chronic alcohol abuse. -Transfuse if Hgb <7   Sinus tachycardia: Patient developed sinus tachycardia, likely in the setting of holding his beta-blocker that he was taking, once we resumed his beta-blockers, his sinus tachycardia resolved.  Discharge plan was discussed with patient and/or family member and they verbalized understanding and agreed with it.  Discharge Diagnoses:  Principal Problem:   AMS (altered mental status) Active Problems:   Alcohol withdrawal (HCC)   ETOH abuse   Protein-calorie malnutrition, severe   Sinus tachycardia   Pressure injury of skin    Discharge Instructions   Allergies as of 02/20/2023   No Known Allergies      Medication List     STOP taking these medications    amoxicillin-clavulanate 875-125 MG  tablet Commonly known as: AUGMENTIN   doxycycline 100 MG capsule Commonly known as: VIBRAMYCIN       TAKE these medications    ammonium lactate 12 % lotion Commonly known as: AmLactin Apply 1 application topically as needed for dry skin. What changed: when to take this   ammonium lactate 12 % cream Commonly known as: AMLACTIN Apply topically 2 (two) times daily. What changed: Another  medication with the same name was changed. Make sure you understand how and when to take each.   clotrimazole 1 % cream Commonly known as: LOTRIMIN Apply to both feet and between toes twice daily   folic acid 1 MG tablet Commonly known as: FOLVITE Take 1 tablet (1 mg total) by mouth daily.   furosemide 40 MG tablet Commonly known as: LASIX Take 1 tablet (40 mg total) by mouth daily. Please call asap to schedule an office visit. Thank you.   ketoconazole 2 % cream Commonly known as: NIZORAL Apply to both feet and between toes once daily for 6 weeks.   multivitamin with minerals Tabs tablet Take 1 tablet by mouth daily.   ondansetron 4 MG disintegrating tablet Commonly known as: ZOFRAN-ODT '4mg'$  ODT q4 hours prn nausea/vomit   ondansetron 4 MG tablet Commonly known as: ZOFRAN Take 1 tablet (4 mg total) by mouth every 8 (eight) hours as needed for nausea or vomiting. Please schedule a follow up appointment in March for further refills   propranolol 20 MG tablet Commonly known as: INDERAL Take 1 tablet (20 mg total) by mouth 2 (two) times daily. Please schedule a follow up appointment in March: [phone]. Thank you   spironolactone 100 MG tablet Commonly known as: ALDACTONE Take 1 tablet (100 mg total) by mouth daily. You will be due for a follow up appointment March 2024. Please call next month to schedule   thiamine 100 MG tablet Commonly known as: VITAMIN B1 Take 1 tablet (100 mg total) by mouth daily.        Follow-up Information     Lilland, Alana, DO Follow up in 1 week(s).   Specialty: Family Medicine Contact information: [place] [address] [phone]                No Known Allergies  Consultations: Neurology   Procedures/Studies: DG Abd Portable 1V  Result Date: 02/16/2023 CLINICAL DATA:  NG tube placement. EXAM: PORTABLE ABDOMEN - 1 VIEW COMPARISON:  05/22/2016. FINDINGS: Feeding tube tip projects over the distal stomach.  No free air under the diaphragm. Unremarkable bowel-gas pattern. IMPRESSION: Feeding tube tip projects over the distal stomach. Electronically Signed   By: Emmit Alexanders M.D.   On: 02/16/2023 11:18   EEG adult  Result Date: 02/15/2023 Lora Havens, MD     02/15/2023  4:54 PM Patient Name: Stephen Carroll MRN: [id number] Epilepsy Attending: Lora Havens Referring Physician/Provider: Kerney Elbe, MD Date: 02/15/2023 Duration: 26.01 mins Patient history: 57yo M with ams. EEG to evaluate for seizure Level of alertness: Awake, asleep AEDs during EEG study: Phenobarb, Ativan Technical aspects: This EEG study was done with scalp electrodes positioned according to the 10-20 International system of electrode placement. Electrical activity was reviewed with band pass filter of 1-'70Hz'$ , sensitivity of 7 uV/mm, display speed of 25m/sec with a '60Hz'$  notched filter applied as appropriate. EEG data were recorded continuously and digitally stored.  Video monitoring was available and reviewed as appropriate. Description: No clear posterior dominant rhythm was seen. Sleep was characterized  by vertex waves, sleep spindles (12 to 14 Hz), maximal frontocentral region.  There is an excessive amount of 15 to 18 Hz beta activity distributed symmetrically and diffusely. Hyperventilation and photic stimulation were not performed.   ABNORMALITY - Excessive beta, generalized IMPRESSION: This study is within normal limits. The excessive beta activity seen in the background is most likely due to the effect of benzodiazepine and is a benign EEG pattern. No seizures or epileptiform discharges were seen throughout the recording. Priyanka Barbra Sarks   US Abdomen Limited RUQ (LIVER/GB)  Result Date: 02/15/2023 CLINICAL DATA:  Hyperbilirubinemia EXAM: ULTRASOUND ABDOMEN LIMITED RIGHT UPPER QUADRANT COMPARISON:  CT 02/13/2023 FINDINGS: Gallbladder: No gallstones or wall thickening visualized. No sonographic Murphy sign noted by sonographer.  Common bile duct: Diameter: 3 mm Liver: Diffusely echogenic hepatic parenchyma consistent with fatty liver infiltration. With this level of echogenicity evaluation for underlying mass lesion is limited. Please correlate with prior contrast CT. Portal vein is patent on color Doppler imaging with normal direction of blood flow towards the liver. Other: Trace ascites IMPRESSION: Fatty liver infiltration. No gallstones or ductal dilatation. Electronically Signed   By: Jill Side M.D.   On: 02/15/2023 12:11   CT HEAD WO CONTRAST (5MM)  Result Date: 02/15/2023 CLINICAL DATA:  Delirium EXAM: CT HEAD WITHOUT CONTRAST TECHNIQUE: Contiguous axial images were obtained from the base of the skull through the vertex without intravenous contrast. RADIATION DOSE REDUCTION: This exam was performed according to the departmental dose-optimization program which includes automated exposure control, adjustment of the mA and/or kV according to patient size and/or use of iterative reconstruction technique. COMPARISON:  06/15/2014. FINDINGS: Brain: There is periventricular white matter decreased attenuation consistent with small vessel ischemic changes. Ventricles, sulci and cisterns are prominent consistent with age related involutional changes. No acute intracranial hemorrhage, mass effect or shift. No hydrocephalus. Small foci of encephalomalacia bilateral basal ganglia consistent with old lacunar CVAs. Vascular: No hyperdense vessel or unexpected calcification. Skull: Normal. Negative for fracture or focal lesion. Sinuses/Orbits: No acute finding. IMPRESSION: Atrophy and chronic small vessel ischemic changes. Chronic basal ganglia lacunar CVAs. No acute intracranial process identified. Electronically Signed   By: Sammie Bench M.D.   On: 02/15/2023 07:48   CT CHEST WO CONTRAST  Result Date: 02/15/2023 CLINICAL DATA:  57 year old male with suspected pneumonia. Delirium. EXAM: CT CHEST WITHOUT CONTRAST TECHNIQUE: Multidetector  CT imaging of the chest was performed following the standard protocol without IV contrast. RADIATION DOSE REDUCTION: This exam was performed according to the departmental dose-optimization program which includes automated exposure control, adjustment of the mA and/or kV according to patient size and/or use of iterative reconstruction technique. COMPARISON:  No priors. FINDINGS: Cardiovascular: Heart size is normal. There is no significant pericardial fluid, thickening or pericardial calcification. There is aortic atherosclerosis, as well as atherosclerosis of the great vessels of the mediastinum and the coronary arteries, including calcified atherosclerotic plaque in the left anterior descending and right coronary arteries. Mediastinum/Nodes: No pathologically enlarged mediastinal or hilar lymph nodes. Please note that accurate exclusion of hilar adenopathy is limited on noncontrast CT scans. Esophagus is unremarkable in appearance. No axillary lymphadenopathy. Lungs/Pleura: Study is severely limited by a large amount of patient respiratory motion. With these limitations in mind there are no suspicious appearing pulmonary nodules or masses. No acute consolidative airspace disease. No pleural effusions. Upper Abdomen: Severe diffuse low attenuation throughout the visualized hepatic parenchyma, indicative of a background of severe hepatic steatosis. Aortic atherosclerosis. Musculoskeletal: Orthopedic fixation hardware in the  lumbar spine incompletely imaged. There are no aggressive appearing lytic or blastic lesions noted in the visualized portions of the skeleton. IMPRESSION: 1. No acute findings in the thorax to account for the patient's symptoms. 2. Aortic atherosclerosis, in addition to two-vessel coronary artery disease. Please note that although the presence of coronary artery calcium documents the presence of coronary artery disease, the severity of this disease and any potential stenosis cannot be assessed on  this non-gated CT examination. Assessment for potential risk factor modification, dietary therapy or pharmacologic therapy may be warranted, if clinically indicated. 3. Severe hepatic steatosis. Aortic Atherosclerosis (ICD10-I70.0). Electronically Signed   By: Vinnie Langton M.D.   On: 02/15/2023 07:43   DG Chest Port 1 View  Result Date: 02/15/2023 CLINICAL DATA:  Chills EXAM: PORTABLE CHEST 1 VIEW COMPARISON:  02/13/23 FINDINGS: The heart size and mediastinal contours are within normal limits. Both lungs are clear. The visualized skeletal structures are unremarkable. IMPRESSION: No active disease. Electronically Signed   By: Inez Catalina M.D.   On: 02/15/2023 03:34   CT ABDOMEN PELVIS W CONTRAST  Result Date: 02/13/2023 CLINICAL DATA:  hiccups, fever, n/v, etoh abuse EXAM: CT ABDOMEN AND PELVIS WITH CONTRAST TECHNIQUE: Multidetector CT imaging of the abdomen and pelvis was performed using the standard protocol following bolus administration of intravenous contrast. RADIATION DOSE REDUCTION: This exam was performed according to the departmental dose-optimization program which includes automated exposure control, adjustment of the mA and/or kV according to patient size and/or use of iterative reconstruction technique. CONTRAST:  116m OMNIPAQUE IOHEXOL 300 MG/ML  SOLN COMPARISON:  Abdominal MRI 09/07/2022, abdominal CT 05/17/2017 FINDINGS: Lower chest: Minimal peribronchovascular opacity in the dependent right lower lobe trace right pleural thickening Hepatobiliary: Diffuse hepatic steatosis. Nodular hepatic contours typical of cirrhosis. Ill-defined area of more decreased attenuation adjacent to the gallbladder, series 2, image 32, was present on remote prior CT, without MRI correlate. Unremarkable gallbladder. No biliary dilatation. Pancreas: Portions of the pancreas are obscured by streak artifact from spinal fusion hardware. Allowing for this, no evidence of pancreatic inflammation. No ductal dilatation.  No pancreatic mass. Spleen: Normal in size without focal abnormality. Adrenals/Urinary Tract: Normal adrenal glands. No hydronephrosis or perinephric edema. Homogeneous renal enhancement with symmetric excretion on delayed phase imaging. No renal calculi or suspicious renal lesion. Urinary bladder is moderately distended without wall thickening. Stomach/Bowel: Mild wall thickening of the distal esophagus and at the gastroesophageal junction. Mild Peri pyloric gastric wall thickening. No small bowel obstruction or inflammatory change. Small to moderate volume of stool in the colon. Normal appendix is tentatively visualized. Regardless, no appendicitis. Vascular/Lymphatic: Mild aortic atherosclerosis without aneurysm. The portal vein appears patent, although partially obscured by streak artifact from spinal fusion hardware. No adenopathy. Reproductive: Prostate is unremarkable. Other: No ascites. No free air or abdominopelvic collection. No abdominal wall hernia. Musculoskeletal: L1-L2 fusion hardware with resultant streak artifact. There are no acute or suspicious osseous abnormalities. IMPRESSION: 1. Mild wall thickening of the distal esophagus and gastroesophageal junction, suggesting esophagitis/gastritis. 2. Hepatic steatosis and cirrhosis. Ill-defined area of more decreased attenuation adjacent to the gallbladder, present on remote prior CT, without MRI correlate, likely an area of focal fatty infiltration. 3. Minimal peribronchovascular opacity in the dependent right lower lobe in the included lung bases, likely infectious or inflammatory. Aortic Atherosclerosis (ICD10-I70.0). Electronically Signed   By: MKeith RakeM.D.   On: 02/13/2023 17:01   DG Chest Portable 1 View  Result Date: 02/13/2023 CLINICAL DATA:  Syncope and fever. EXAM:  PORTABLE CHEST 1 VIEW COMPARISON:  Chest x-ray dated January 05, 2020. FINDINGS: The heart size and mediastinal contours are within normal limits. Normal pulmonary  vascularity. No focal consolidation, pleural effusion, or pneumothorax. No acute osseous abnormality. 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 IMPRESSION: No active disease. Electronically Signed   By: Titus Dubin M.D.   On: 02/13/2023 14:22     Discharge Exam: Vitals:   02/20/23 0623 02/20/23 0800  BP:  (!) 150/92  Pulse: 82 89  Resp: 16 18  Temp:    SpO2: 95% 93%   Vitals:   02/20/23 0000 02/20/23 0338 02/20/23 0623 02/20/23 0800  BP: 139/88 (!) 156/87  (!) 150/92  Pulse: 81 85 82 89  Resp: '19 12 16 18  '$ Temp:  98.5 F (36.9 C)    TempSrc:  Oral    SpO2: 94% 92% 95% 93%  Weight:   69.9 kg   Height:        General: Pt is alert, awake, not in acute distress Cardiovascular: RRR, S1/S2 +, no rubs, no gallops Respiratory: CTA bilaterally, no wheezing, no rhonchi Abdominal: Soft, NT, ND, bowel sounds + Extremities: no edema, no cyanosis    The results of significant diagnostics from this hospitalization (including imaging, microbiology, ancillary and laboratory) are listed below for reference.     Microbiology: Recent Results (from the past 240 hour(s))  Resp panel by RT-PCR (RSV, Flu A&B, Covid) Anterior Nasal Swab     Status: None   Collection Time: 02/13/23  1:46 PM   Specimen: Anterior Nasal Swab  Result Value Ref Range Status   SARS Coronavirus 2 by RT PCR NEGATIVE NEGATIVE Final    Comment: (NOTE) SARS-CoV-2 target nucleic acids are NOT DETECTED.  The SARS-CoV-2 RNA is generally detectable in upper respiratory specimens during the acute phase of infection. The lowest concentration of SARS-CoV-2 viral copies this assay can detect is 138 copies/mL. A negative result does not preclude SARS-Cov-2 infection and should not be used as the sole basis for treatment or other patient management decisions. A negative result may occur with  improper specimen collection/handling, submission of specimen other than nasopharyngeal swab, presence of  viral mutation(s) within the areas targeted by this assay, and inadequate number of viral copies(<138 copies/mL). A negative result must be combined with clinical observations, patient history, and epidemiological information. The expected result is Negative.  Fact Sheet for Patients:  EntrepreneurPulse.com.au  Fact Sheet for Healthcare Providers:  IncredibleEmployment.be  This test is no t yet approved or cleared by the [place] FDA and  has been authorized for detection and/or diagnosis of SARS-CoV-2 by FDA under an Emergency Use Authorization (EUA). This EUA will remain  in effect (meaning this test can be used) for the duration of the COVID-19 declaration under Section 564(b)(1) of the Act, 21 U.S.C.section 360bbb-3(b)(1), unless the authorization is terminated  or revoked sooner.       Influenza A by PCR NEGATIVE NEGATIVE Final   Influenza B by PCR NEGATIVE NEGATIVE Final    Comment: (NOTE) The Xpert Xpress SARS-CoV-2/FLU/RSV plus assay is intended as an aid in the diagnosis of influenza from Nasopharyngeal swab specimens and should not be used as a sole basis for treatment. Nasal washings and aspirates are unacceptable for Xpert Xpress SARS-CoV-2/FLU/RSV testing.  Fact Sheet for Patients: EntrepreneurPulse.com.au  Fact Sheet for Healthcare Providers: IncredibleEmployment.be  This test is not yet approved or cleared by the [place] FDA and has been authorized for detection and/or diagnosis  of SARS-CoV-2 by FDA under an Emergency Use Authorization (EUA). This EUA will remain in effect (meaning this test can be used) for the duration of the COVID-19 declaration under Section 564(b)(1) of the Act, 21 U.S.C. section 360bbb-3(b)(1), unless the authorization is terminated or revoked.     Resp Syncytial Virus by PCR NEGATIVE NEGATIVE Final    Comment: (NOTE) Fact Sheet for  Patients: EntrepreneurPulse.com.au  Fact Sheet for Healthcare Providers: IncredibleEmployment.be  This test is not yet approved or cleared by the [place] FDA and has been authorized for detection and/or diagnosis of SARS-CoV-2 by FDA under an Emergency Use Authorization (EUA). This EUA will remain in effect (meaning this test can be used) for the duration of the COVID-19 declaration under Section 564(b)(1) of the Act, 21 U.S.C. section 360bbb-3(b)(1), unless the authorization is terminated or revoked.  Performed at [facility], [place] [address] [place], [place] [address]   Culture, blood (single)     Status: None   Collection Time: 02/15/23  3:16 AM   Specimen: BLOOD  Result Value Ref Range Status   Specimen Description BLOOD BLOOD RIGHT HAND  Final   Special Requests   Final    BOTTLES DRAWN AEROBIC AND ANAEROBIC Blood Culture results may not be optimal due to an inadequate volume of blood received in culture bottles   Culture   Final    NO GROWTH 5 DAYS Performed at [facility], [place] [address], [place], [place] [address]    Report Status 02/20/2023 FINAL  Final  MRSA Next Gen by PCR, Nasal     Status: None   Collection Time: 02/15/23  9:49 AM   Specimen: Nasal Mucosa; Nasal Swab  Result Value Ref Range Status   MRSA by PCR Next Gen NOT DETECTED NOT DETECTED Final    Comment: (NOTE) The GeneXpert MRSA Assay (FDA approved for NASAL specimens only), is one component of a comprehensive MRSA colonization surveillance program. It is not intended to diagnose MRSA infection nor to guide or monitor treatment for MRSA infections. Test performance is not FDA approved in patients less than 59 years old. Performed at [facility], [place] [address] [place], [place] [address]      Labs: BNP (last 3 results) No results for input(s): "BNP" in the last 8760 hours. Basic Metabolic Panel: Recent Labs  Lab  02/16/23 0742 02/16/23 1504 02/16/23 2255 02/17/23 0921 02/17/23 1819 02/18/23 0538 02/19/23 0214  NA 125* 123* 125* 127*  --  129* 128*  K 3.6 2.9* 3.0* 2.9*  --  3.2* 3.1*  CL 90* 90* 95* 93*  --  99 97*  CO2 20* 20* 24 26  --  22 21*  GLUCOSE 84 127* 116* 108*  --  121* 97  BUN '6 7 7 '$ <5*  --  5* <5*  CREATININE 0.67 0.66 0.55* 0.67  --  0.52* 0.52*  CALCIUM 8.3* 8.1* 8.0* 8.0*  --  8.1* 8.1*  MG 1.7 2.2  --  2.0 2.0 2.0  --   PHOS  --  1.1*  --  1.2* 3.1 2.6 2.9   Liver Function Tests: Recent Labs  Lab 02/13/23 1246 02/15/23 0320 02/17/23 0921 02/18/23 0538  AST 86* 124* 71* 52*  ALT 36 44 28 23  ALKPHOS 46 61 47 43  BILITOT 0.6 1.4* 0.7 0.7  PROT 7.7 9.5* 7.0 6.3*  ALBUMIN 3.2* 3.7 2.7* 2.4*   Recent Labs  Lab 02/13/23 1246  LIPASE 28  Recent Labs  Lab 02/13/23 1246 02/15/23 0438  AMMONIA 20 32   CBC: Recent Labs  Lab 02/13/23 1246 02/15/23 0320 02/16/23 0742 02/17/23 0921 02/18/23 0538 02/19/23 0214 02/20/23 0315  WBC 2.7*   < > 5.5 4.0 4.3 5.2 6.1  NEUTROABS 1.6*  --  4.1  --   --   --   --   HGB 9.7*   < > 11.0* 10.1* 8.9* 8.9* 9.3*  HCT 28.1*   < > 32.3* 28.7* 25.5* 24.9* 27.1*  MCV 78.9*   < > 79.6* 77.8* 77.5* 77.1* 78.8*  PLT 86*   < > 80* 73* 80* 106* 171   < > = values in this interval not displayed.   Cardiac Enzymes: Recent Labs  Lab 02/15/23 0320 02/16/23 0732 02/17/23 0921  CKTOTAL 1,761* 885* 519*   BNP: Invalid input(s): "POCBNP" CBG: Recent Labs  Lab 02/17/23 1539 02/17/23 1938 02/18/23 0810 02/18/23 1201 02/18/23 1626  GLUCAP 133* 137* 125* 122* 105*   D-Dimer No results for input(s): "DDIMER" in the last 72 hours. Hgb A1c No results for input(s): "HGBA1C" in the last 72 hours. Lipid Profile No results for input(s): "CHOL", "HDL", "LDLCALC", "TRIG", "CHOLHDL", "LDLDIRECT" in the last 72 hours. Thyroid function studies No results for input(s): "TSH", "T4TOTAL", "T3FREE", "THYROIDAB" in the last 72  hours.  Invalid input(s): "FREET3" Anemia work up No results for input(s): "VITAMINB12", "FOLATE", "FERRITIN", "TIBC", "IRON", "RETICCTPCT" in the last 72 hours. Urinalysis    Component Value Date/Time   COLORURINE STRAW (A) 02/15/2023 0855   APPEARANCEUR CLEAR 02/15/2023 0855   LABSPEC 1.006 02/15/2023 0855   PHURINE 6.0 02/15/2023 0855   GLUCOSEU NEGATIVE 02/15/2023 0855   HGBUR MODERATE (A) 02/15/2023 0855   BILIRUBINUR NEGATIVE 02/15/2023 0855   KETONESUR 20 (A) 02/15/2023 0855   PROTEINUR NEGATIVE 02/15/2023 0855   UROBILINOGEN 1.0 08/07/2015 0655   NITRITE NEGATIVE 02/15/2023 0855   LEUKOCYTESUR NEGATIVE 02/15/2023 0855   Sepsis Labs Recent Labs  Lab 02/17/23 0921 02/18/23 0538 02/19/23 0214 02/20/23 0315  WBC 4.0 4.3 5.2 6.1   Microbiology Recent Results (from the past 240 hour(s))  Resp panel by RT-PCR (RSV, Flu A&B, Covid) Anterior Nasal Swab     Status: None   Collection Time: 02/13/23  1:46 PM   Specimen: Anterior Nasal Swab  Result Value Ref Range Status   SARS Coronavirus 2 by RT PCR NEGATIVE NEGATIVE Final    Comment: (NOTE) SARS-CoV-2 target nucleic acids are NOT DETECTED.  The SARS-CoV-2 RNA is generally detectable in upper respiratory specimens during the acute phase of infection. The lowest concentration of SARS-CoV-2 viral copies this assay can detect is 138 copies/mL. A negative result does not preclude SARS-Cov-2 infection and should not be used as the sole basis for treatment or other patient management decisions. A negative result may occur with  improper specimen collection/handling, submission of specimen other than nasopharyngeal swab, presence of viral mutation(s) within the areas targeted by this assay, and inadequate number of viral copies(<138 copies/mL). A negative result must be combined with clinical observations, patient history, and epidemiological information. The expected result is Negative.  Fact Sheet for Patients:   EntrepreneurPulse.com.au  Fact Sheet for Healthcare Providers:  IncredibleEmployment.be  This test is no t yet approved or cleared by the [place] FDA and  has been authorized for detection and/or diagnosis of SARS-CoV-2 by FDA under an Emergency Use Authorization (EUA). This EUA will remain  in effect (meaning this test can be used) for the  duration of the COVID-19 declaration under Section 564(b)(1) of the Act, 21 U.S.C.section 360bbb-3(b)(1), unless the authorization is terminated  or revoked sooner.       Influenza A by PCR NEGATIVE NEGATIVE Final   Influenza B by PCR NEGATIVE NEGATIVE Final    Comment: (NOTE) The Xpert Xpress SARS-CoV-2/FLU/RSV plus assay is intended as an aid in the diagnosis of influenza from Nasopharyngeal swab specimens and should not be used as a sole basis for treatment. Nasal washings and aspirates are unacceptable for Xpert Xpress SARS-CoV-2/FLU/RSV testing.  Fact Sheet for Patients: EntrepreneurPulse.com.au  Fact Sheet for Healthcare Providers: IncredibleEmployment.be  This test is not yet approved or cleared by the [place] FDA and has been authorized for detection and/or diagnosis of SARS-CoV-2 by FDA under an Emergency Use Authorization (EUA). This EUA will remain in effect (meaning this test can be used) for the duration of the COVID-19 declaration under Section 564(b)(1) of the Act, 21 U.S.C. section 360bbb-3(b)(1), unless the authorization is terminated or revoked.     Resp Syncytial Virus by PCR NEGATIVE NEGATIVE Final    Comment: (NOTE) Fact Sheet for Patients: EntrepreneurPulse.com.au  Fact Sheet for Healthcare Providers: IncredibleEmployment.be  This test is not yet approved or cleared by the [place] FDA and has been authorized for detection and/or diagnosis of SARS-CoV-2 by FDA under an Emergency Use  Authorization (EUA). This EUA will remain in effect (meaning this test can be used) for the duration of the COVID-19 declaration under Section 564(b)(1) of the Act, 21 U.S.C. section 360bbb-3(b)(1), unless the authorization is terminated or revoked.  Performed at [facility], [place] [address] [place], [place] [address]   Culture, blood (single)     Status: None   Collection Time: 02/15/23  3:16 AM   Specimen: BLOOD  Result Value Ref Range Status   Specimen Description BLOOD BLOOD RIGHT HAND  Final   Special Requests   Final    BOTTLES DRAWN AEROBIC AND ANAEROBIC Blood Culture results may not be optimal due to an inadequate volume of blood received in culture bottles   Culture   Final    NO GROWTH 5 DAYS Performed at [facility], [place] [address], [place], [place] [address]    Report Status 02/20/2023 FINAL  Final  MRSA Next Gen by PCR, Nasal     Status: None   Collection Time: 02/15/23  9:49 AM   Specimen: Nasal Mucosa; Nasal Swab  Result Value Ref Range Status   MRSA by PCR Next Gen NOT DETECTED NOT DETECTED Final    Comment: (NOTE) The GeneXpert MRSA Assay (FDA approved for NASAL specimens only), is one component of a comprehensive MRSA colonization surveillance program. It is not intended to diagnose MRSA infection nor to guide or monitor treatment for MRSA infections. Test performance is not FDA approved in patients less than 15 years old. Performed at [facility], [place] [address], [place], [place] [address]      Time coordinating discharge: Over 30 minutes  SIGNED:   Darliss Cheney, MD  [facility] 02/20/2023, 9:56 AM *Please note that this is a verbal dictation therefore any spelling or grammatical errors are due to the "[place] One" system interpretation. If 7PM-7AM, please contact night-coverage [email or web address]

## 2023-02-21 ENCOUNTER — Ambulatory Visit: Payer: Medicaid Other | Admitting: Podiatry

## 2023-02-21 ENCOUNTER — Telehealth: Payer: Self-pay

## 2023-02-21 NOTE — Telephone Encounter (Signed)
-----   Message from Yetta Flock, MD sent at 02/20/2023  6:44 PM EDT ----- Regarding: RE: due for ruq and labs in March Okay, his imaging looks all right.  He just needs a routine office visit with me.  Thanks ----- Message ----- From: Roetta Sessions, CMA Sent: 02/20/2023   2:38 PM EDT To: Yetta Flock, MD Subject: FW: due for ruq and labs in March              Fyi this patient is currently admitted and just had RUQ U/S 02-15-23    ----- Message ----- From: Roetta Sessions, CMA Sent: 02/20/2023  12:00 AM EDT To: Roetta Sessions, CMA Subject: due for ruq and labs in March                  Due for RUQ U/S and labs in late March (28th)- Cirrhosis

## 2023-02-21 NOTE — Telephone Encounter (Signed)
Patient has appointment in May.

## 2023-03-01 NOTE — Progress Notes (Unsigned)
  SUBJECTIVE:   CHIEF COMPLAINT / HPI:   Hospital F/u:  Patient presented to [facility] for AMS, lactic acidosis, chronic hyponatremia. Initially admitted to ICU due to his lactic acidosis and his history of seizures and DTs, treated in ICU and then eventually was transferred to medical floor 3/9.   He was having difficulty with eating during admission, required short term tube feeds that were discontinued prior to discharge. Treated for beer potomania. Did not have seizures or DTs in hospital with as needed Ativan and phenobarbital taper.   Today, he notes improvement of eating, has been eating everyday  He drinks 1.5 beers a day, no liquor  Only reports of a cough that has been present since discharge  No fever or chills  Denies dyspnea or chest pain   Recommendations for Outpatient Follow-up:  Follow up with PCP in 1-2 weeks Please obtain BMP/CBC in one week  PERTINENT  PMH / PSH:  Cerebral aneurysm  Alcoholic cirrhosis of liver  Alcohol withdrawal with seizures and DT Pancytopenia  Tobacco use  Chronic hyponatremia    Patient Care Team: Rise Patience, DO as PCP - General (Family Medicine) OBJECTIVE:  BP 138/72   Pulse 93   Ht 5\' 8"  (1.727 m)   Wt 137 lb (62.1 kg)   SpO2 100%   BMI 20.83 kg/m  Physical Exam  General: Alert and oriented in no apparent distress Heart: Regular rate and rhythm with no murmurs appreciated Lungs: CTA bilaterally, no wheezing Abdomen: no abdominal pain  Skin: Warm and dry Extremities: No lower extremity edema   ASSESSMENT/PLAN:  Alcohol withdrawal syndrome, with delirium (HCC) Assessment & Plan: Recheck CBC and CMP from discharge.  Does not currently want to stop alcohol use but eating well currently  Will continue to approach the conversation   Orders: -     CBC with Differential/Platelet -     Comprehensive metabolic panel  Acute cough Assessment & Plan: Possible viral insult to cause residual coughing. However, patient has  high risk aspiration, provided strict return precautions regarding symptoms. No infectious symptoms on exam. Well appearing with normal lung exam. Tessalon pearls for symptomatic relief. Follow up 1 month to assess chronic conditions.   Orders: -     Benzonatate; Take 1 capsule (100 mg total) by mouth 2 (two) times daily as needed for cough.  Dispense: 20 capsule; Refill: 0   Return in about 4 weeks (around 03/30/2023). Erskine Emery, MD 03/03/2023, 9:41 PM PGY-2, [place]

## 2023-03-02 ENCOUNTER — Encounter: Payer: Self-pay | Admitting: Student

## 2023-03-02 ENCOUNTER — Ambulatory Visit: Payer: Medicaid Other | Admitting: Student

## 2023-03-02 VITALS — BP 138/72 | HR 93 | Ht 68.0 in | Wt 137.0 lb

## 2023-03-02 DIAGNOSIS — F10931 Alcohol use, unspecified with withdrawal delirium: Secondary | ICD-10-CM | POA: Diagnosis not present

## 2023-03-02 DIAGNOSIS — R051 Acute cough: Secondary | ICD-10-CM

## 2023-03-02 MED ORDER — BENZONATATE 100 MG PO CAPS: 100.0000 mg | ORAL_CAPSULE | Freq: Two times a day (BID) | ORAL | 0 refills | Status: DC | PRN

## 2023-03-02 NOTE — Patient Instructions (Addendum)
It was great to see you today! Thank you for choosing [facility] for your primary care. Stephen Carroll was seen for follow up.  Today we addressed:  Tessalon pearls were sent for your cough  I want to see you in one month  Work on decreasing alcohol intake  I will update you with your labs   If you haven't already, sign up for My Chart to have easy access to your labs results, and communication with your primary care physician.  I recommend that you always bring your medications to each appointment as this makes it easy to ensure you are on the correct medications and helps [place] not miss refills when you need them. Call the clinic at [phone] if your symptoms worsen or you have any concerns.  You should return to our clinic Return in about 4 weeks (around 03/30/2023). Please arrive 15 minutes before your appointment to ensure smooth check in process.  We appreciate your efforts in making this happen.  Thank you for allowing me to participate in your care, Erskine Emery, MD 03/02/2023, 4:35 PM PGY-2, [facility]

## 2023-03-03 ENCOUNTER — Encounter: Payer: Self-pay | Admitting: Student

## 2023-03-03 DIAGNOSIS — R053 Chronic cough: Secondary | ICD-10-CM | POA: Insufficient documentation

## 2023-03-03 DIAGNOSIS — R051 Acute cough: Secondary | ICD-10-CM | POA: Insufficient documentation

## 2023-03-03 LAB — CBC WITH DIFFERENTIAL/PLATELET
Basophils Absolute: 0 10*3/uL (ref 0.0–0.2)
Basos: 1 %
EOS (ABSOLUTE): 0.1 10*3/uL (ref 0.0–0.4)
Eos: 1 %
Hematocrit: 30.9 % — ABNORMAL LOW (ref 37.5–51.0)
Hemoglobin: 10 g/dL — ABNORMAL LOW (ref 13.0–17.7)
Immature Grans (Abs): 0 10*3/uL (ref 0.0–0.1)
Immature Granulocytes: 0 %
Lymphocytes Absolute: 1.5 10*3/uL (ref 0.7–3.1)
Lymphs: 33 %
MCH: 27 pg (ref 26.6–33.0)
MCHC: 32.4 g/dL (ref 31.5–35.7)
MCV: 84 fL (ref 79–97)
Monocytes Absolute: 0.6 10*3/uL (ref 0.1–0.9)
Monocytes: 13 %
Neutrophils Absolute: 2.4 10*3/uL (ref 1.4–7.0)
Neutrophils: 52 %
Platelets: 357 10*3/uL (ref 150–450)
RBC: 3.7 x10E6/uL — ABNORMAL LOW (ref 4.14–5.80)
RDW: 15.2 % (ref 11.6–15.4)
WBC: 4.5 10*3/uL (ref 3.4–10.8)

## 2023-03-03 LAB — COMPREHENSIVE METABOLIC PANEL
ALT: 18 IU/L (ref 0–44)
AST: 26 IU/L (ref 0–40)
Albumin/Globulin Ratio: 1 — ABNORMAL LOW (ref 1.2–2.2)
Albumin: 3.9 g/dL (ref 3.8–4.9)
Alkaline Phosphatase: 62 IU/L (ref 44–121)
BUN/Creatinine Ratio: 9 (ref 9–20)
BUN: 6 mg/dL (ref 6–24)
Bilirubin Total: 0.6 mg/dL (ref 0.0–1.2)
CO2: 25 mmol/L (ref 20–29)
Calcium: 9.7 mg/dL (ref 8.7–10.2)
Chloride: 97 mmol/L (ref 96–106)
Creatinine, Ser: 0.67 mg/dL — ABNORMAL LOW (ref 0.76–1.27)
Globulin, Total: 4 g/dL (ref 1.5–4.5)
Glucose: 88 mg/dL (ref 70–99)
Potassium: 4.6 mmol/L (ref 3.5–5.2)
Sodium: 134 mmol/L (ref 134–144)
Total Protein: 7.9 g/dL (ref 6.0–8.5)
eGFR: 110 mL/min/{1.73_m2} (ref >59)

## 2023-03-03 NOTE — Assessment & Plan Note (Signed)
Recheck CBC and CMP from discharge.  Does not currently want to stop alcohol use but eating well currently  Will continue to approach the conversation

## 2023-03-03 NOTE — Assessment & Plan Note (Signed)
Possible viral insult to cause residual coughing. However, patient has high risk aspiration, provided strict return precautions regarding symptoms. No infectious symptoms on exam. Well appearing with normal lung exam. Tessalon pearls for symptomatic relief. Follow up 1 month to assess chronic conditions.

## 2023-03-05 ENCOUNTER — Encounter: Payer: Self-pay | Admitting: Student

## 2023-03-14 ENCOUNTER — Other Ambulatory Visit: Payer: Self-pay | Admitting: Gastroenterology

## 2023-03-28 ENCOUNTER — Ambulatory Visit (INDEPENDENT_AMBULATORY_CARE_PROVIDER_SITE_OTHER): Payer: Medicaid Other | Admitting: Podiatry

## 2023-03-28 VITALS — BP 127/70

## 2023-03-28 DIAGNOSIS — Q828 Other specified congenital malformations of skin: Secondary | ICD-10-CM | POA: Diagnosis not present

## 2023-03-28 DIAGNOSIS — M79675 Pain in left toe(s): Secondary | ICD-10-CM

## 2023-03-28 DIAGNOSIS — Z89422 Acquired absence of other left toe(s): Secondary | ICD-10-CM | POA: Diagnosis not present

## 2023-03-28 DIAGNOSIS — M79674 Pain in right toe(s): Secondary | ICD-10-CM

## 2023-03-28 DIAGNOSIS — B351 Tinea unguium: Secondary | ICD-10-CM | POA: Diagnosis not present

## 2023-03-28 NOTE — Progress Notes (Signed)
  Subjective:  Patient ID: Stephen Carroll, male    DOB: December 08, 1966,  MRN: [id number]  Hakop E Monjaraz presents to clinic today for: Chief Complaint  Patient presents with   Porokeratoses and calluses b/l with painful mycotic toenails.    RFC PCP-Lilland PCP VST-do not know   New problem(s): None.   PCP is Lilland, Alana, DO.  No Known Allergies  Review of Systems: Negative except as noted in the HPI.  Objective: No changes noted in today's physical examination. Vitals:   03/28/23 1547  BP: 127/70   Williams E Mares is a pleasant 57 y.o. male WD, WN in NAD. AAO x 3.  Vascular Examination: Palpable pedal pulses b/l LE. Digital hair present b/l. No pedal edema b/l. Skin temperature gradient WNL b/l. No varicosities b/l. CFT <3 seconds b/l LE. No pain with calf compression b/l.Marland Kitchen  Dermatological Examination: Pedal skin with normal turgor, texture and tone b/l. No open wounds. No interdigital macerations b/l.   Toenails bilateral great toes, right 2nd toe and digits 3-5 b/l thickened, discolored, dystrophic with subungual debris. There is pain on palpation to dorsal aspect of nailplates.   Porokeratotic lesion(s) plantar heel pad of left foot, medial IPJ of left great toe, medial IPJ of right great toe, and lateral nailfold bilateral 5th digits. No erythema, no edema, no drainage, no fluctuance.   Neurological Examination: Protective sensation intact with 10 gram monofilament b/l LE. Vibratory sensation intact b/l LE.   Musculoskeletal Examination: Muscle strength 5/5 to all LE muscle groups b/l. HAV with bunion deformity noted b/l LE. Hammertoe(s) noted to the bilateral 3rd toes, bilateral 4th toes, and R 2nd toe. 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  Assessment/Plan: 1. Pain due to onychomycosis of toenails of both feet   2. Corns and callosities   3. Porokeratosis   4. Status post amputation of lesser toe of left foot     -Patient was evaluated and treated. All  patient's and/or POA's questions/concerns answered on today's visit. -Toenails 1-5 b/l were debrided in length and girth with sterile nail nippers and dremel without iatrogenic bleeding.  -Porokeratotic lesion(s) left heel, bilateral great toes, and bilateral 5th toes pared and enucleated with sterile currette without incident. Total number of lesions debrided=5. -Patient/POA to call should there be question/concern in the interim.   Return in about 3 months (around 06/27/2023).  Freddie Breech, DPM

## 2023-03-30 ENCOUNTER — Ambulatory Visit: Payer: Medicaid Other | Admitting: Family Medicine

## 2023-04-01 ENCOUNTER — Encounter: Payer: Self-pay | Admitting: Podiatry

## 2023-04-07 ENCOUNTER — Other Ambulatory Visit: Payer: Self-pay | Admitting: Gastroenterology

## 2023-04-10 ENCOUNTER — Other Ambulatory Visit: Payer: Self-pay | Admitting: Gastroenterology

## 2023-04-10 ENCOUNTER — Encounter: Payer: Self-pay | Admitting: Family Medicine

## 2023-04-10 ENCOUNTER — Ambulatory Visit: Payer: Medicaid Other | Admitting: Family Medicine

## 2023-04-10 VITALS — BP 135/95 | HR 74 | Ht 68.0 in | Wt 140.4 lb

## 2023-04-10 DIAGNOSIS — R053 Chronic cough: Secondary | ICD-10-CM

## 2023-04-10 DIAGNOSIS — D649 Anemia, unspecified: Secondary | ICD-10-CM | POA: Diagnosis not present

## 2023-04-10 DIAGNOSIS — Z23 Encounter for immunization: Secondary | ICD-10-CM | POA: Diagnosis not present

## 2023-04-10 DIAGNOSIS — F1010 Alcohol abuse, uncomplicated: Secondary | ICD-10-CM

## 2023-04-10 DIAGNOSIS — R0982 Postnasal drip: Secondary | ICD-10-CM | POA: Diagnosis not present

## 2023-04-10 DIAGNOSIS — R051 Acute cough: Secondary | ICD-10-CM | POA: Diagnosis not present

## 2023-04-10 DIAGNOSIS — K7030 Alcoholic cirrhosis of liver without ascites: Secondary | ICD-10-CM | POA: Diagnosis not present

## 2023-04-10 MED ORDER — BENZONATATE 100 MG PO CAPS: 100.0000 mg | ORAL_CAPSULE | Freq: Two times a day (BID) | ORAL | 0 refills | Status: DC | PRN

## 2023-04-10 MED ORDER — FUROSEMIDE 40 MG PO TABS: 40.0000 mg | ORAL_TABLET | Freq: Every day | ORAL | 0 refills | Status: DC

## 2023-04-10 MED ORDER — FLUTICASONE PROPIONATE 50 MCG/ACT NA SUSP: 2.0000 | Freq: Every day | NASAL | 11 refills | Status: DC

## 2023-04-10 MED ORDER — ADULT MULTIVITAMIN W/MINERALS CH: 1.0000 | ORAL_TABLET | Freq: Every day | ORAL | 3 refills | Status: DC

## 2023-04-10 MED ORDER — PROPRANOLOL HCL 20 MG PO TABS: 20.0000 mg | ORAL_TABLET | Freq: Two times a day (BID) | ORAL | 0 refills | Status: DC

## 2023-04-10 MED ORDER — FOLIC ACID 1 MG PO TABS: 1.0000 mg | ORAL_TABLET | Freq: Every day | ORAL | 3 refills | Status: DC

## 2023-04-10 MED ORDER — SPIRONOLACTONE 100 MG PO TABS: 100.0000 mg | ORAL_TABLET | Freq: Every day | ORAL | 3 refills | Status: DC

## 2023-04-10 MED ORDER — THIAMINE HCL 100 MG PO TABS: 100.0000 mg | ORAL_TABLET | Freq: Every day | ORAL | 3 refills | Status: DC

## 2023-04-10 MED ORDER — ONDANSETRON 4 MG PO TBDP: ORAL_TABLET | ORAL | 0 refills | Status: DC

## 2023-04-10 NOTE — Progress Notes (Unsigned)
    SUBJECTIVE:   CHIEF COMPLAINT / HPI:   Follow up - cough Seen by Dr. Jena Gauss 3/22 for hospital follow-up.  At that time, discussed acute cough.  Was prescribed Tessalon Perles and instructed to follow-up in 1 month to assess chronic conditions.  Today, He reports that one of the prescriptions sent in was $12 and too expensive, so he did not take that one.  Unclear if he is talking with the Jerilynn Som, he cannot recall the name. Reports that he continues to have a chronic productive cough with phlegm, occurs every day.  Also endorses significant rhinorrhea and postnasal drip with recent pollen exposures.   No previous diagnoses of asthma or COPD per patient.  He reports "my lungs are quite strong" and denies any SOB with exertion or otherwise. Denies history of smoking cigarettes. He reports he has a current regular smoker of cigars, however this is not every day.  He only smokes socially with other people on watching sporting events, getting together for cookouts, and other social gatherings. Does have a long history of secondhand smoke exposure, as he does report his mother used to smoke around him.  Medication refills Reports taking his prescription medicines including Lasix, propranolol, and spironolactone. He says he is getting low and would like refills. Cannot recall if he is taking his vitamins, was previously recommended to take a multivitamin, thiamine, and folate. Also requests refill of Zofran ODT.  PERTINENT  PMH / PSH:  Patient Active Problem List   Diagnosis Date Noted   Chronic cough 03/03/2023   Pressure injury of skin 02/20/2023   Protein-calorie malnutrition, severe 02/16/2023   Normocytic anemia    Chronic hyponatremia    Pain due to onychomycosis of toenails of both feet 01/14/2020   Pancytopenia (HCC) 09/18/2019   Herpes zoster without complication 07/24/2017   Tobacco use 07/01/2017   Alcoholic cirrhosis of liver without ascites (HCC)    Alcohol  withdrawal seizure (HCC)    Aneurysm, cerebral, nonruptured 08/09/2015   Alcohol withdrawal (HCC) 08/07/2015   ETOH abuse 08/07/2015    OBJECTIVE:   BP (!) 135/95   Pulse 74   Ht 5\' 8"  (1.727 m)   Wt 140 lb 6.4 oz (63.7 kg)   SpO2 100%   BMI 21.35 kg/m    General: Awake, alert, NAD Cardiac: Regular rate and rhythm, no murmur Respiratory: Decreased lung sounds globally, although no wheezes, crackles, rales, or other abnormal lung sounds auscultated Psych: Patient anxious appearing, quick to answer, interrupts frequently  ASSESSMENT/PLAN:   Chronic cough Continued persistent daily productive cough.  Considered COPD versus upper airway cough syndrome.   - Re-prescribed Tessalon Perles - Discussed honey, tea, OTC lozenges and other nonprescription methods - Rx Flonase 2 sprays each nostril nightly - PFTs with Dr. Raymondo Band  ETOH abuse Refilled Lasix, propranolol, spironolactone.  Patient declines blood work today.  Would obtain LFTs at next appointment.  Sent in new scripts for multivitamin, thiamine, and folate.  Normocytic anemia Patient declines blood work today.  Would obtain CBC at next appointment.   Need for first booster dose of COVID-19 vaccine Patient declines COVID booster today.  Need for shingles vaccine Patient declines written prescription for shingles vaccine today.  Discussed obtaining at his pharmacy.     Fayette Pho, MD [facility]

## 2023-04-10 NOTE — Patient Instructions (Addendum)
It was wonderful to meet you today. Thank you for allowing me to be a part of your care. Below is a short summary of what we discussed at your visit today:  Cough I believe postnasal drip from allergies is causing a lot of his chronic cough. Start Flonase, 2 sprays each nostril at bedtime every night. Take Tessalon Perles as needed to help reduce cough. You can also use honey and tea to reduce how much you cough.  Come back to do lung function testing with our pharmacist Dr. Raymondo Band.  This can be at your convenience.  Medication refills I have refilled your for prescription medications and 3 vitamins. Please pick these up as soon as possible  Follow-up Come back in a month to let us check in on your kidney function and your red blood cell count (anemia).  Please bring all of your medications to every appointment!  If you have any questions or concerns, please do not hesitate to contact us via phone or MyChart message.   Stephen Pho, MD

## 2023-04-11 DIAGNOSIS — Z23 Encounter for immunization: Secondary | ICD-10-CM | POA: Insufficient documentation

## 2023-04-11 NOTE — Assessment & Plan Note (Signed)
Patient declines blood work today.  Would obtain CBC at next appointment.

## 2023-04-11 NOTE — Assessment & Plan Note (Signed)
Refilled Lasix, propranolol, spironolactone.  Patient declines blood work today.  Would obtain LFTs at next appointment.  Sent in new scripts for multivitamin, thiamine, and folate.

## 2023-04-11 NOTE — Assessment & Plan Note (Signed)
Patient declines written prescription for shingles vaccine today.  Discussed obtaining at his pharmacy.

## 2023-04-11 NOTE — Assessment & Plan Note (Signed)
Continued persistent daily productive cough.  Considered COPD versus upper airway cough syndrome.   - Re-prescribed Tessalon Perles - Discussed honey, tea, OTC lozenges and other nonprescription methods - Rx Flonase 2 sprays each nostril nightly - PFTs with Dr. Raymondo Band

## 2023-04-11 NOTE — Assessment & Plan Note (Signed)
Patient declines COVID booster today.

## 2023-04-13 ENCOUNTER — Encounter: Payer: Self-pay | Admitting: Gastroenterology

## 2023-04-13 ENCOUNTER — Ambulatory Visit: Payer: Medicaid Other | Admitting: Gastroenterology

## 2023-04-13 VITALS — BP 120/68 | HR 85 | Ht 68.0 in | Wt 141.0 lb

## 2023-04-13 DIAGNOSIS — K7030 Alcoholic cirrhosis of liver without ascites: Secondary | ICD-10-CM

## 2023-04-13 DIAGNOSIS — I8500 Esophageal varices without bleeding: Secondary | ICD-10-CM | POA: Diagnosis not present

## 2023-04-13 NOTE — Patient Instructions (Addendum)
Your provider has requested that you go to the basement level for lab work in September 2024. Press "B" on the elevator. The lab is located at the first door on the left as you exit the elevator.  You will be due for a right upper quadrant abdominal ultrasound in September 2024. We will contact you with an appointment as it gets closer to that time.  Continue your current medications.   Please follow up with Dr Adela Lank in 6 months.  _______________________________________________________  If your blood pressure at your visit was 140/90 or greater, please contact your primary care physician to follow up on this.  _______________________________________________________  If you are age 8 or older, your body mass index should be between 23-30. Your Body mass index is 21.44 kg/m. If this is out of the aforementioned range listed, please consider follow up with your Primary Care Provider.  If you are age 13 or younger, your body mass index should be between 19-25. Your Body mass index is 21.44 kg/m. If this is out of the aformentioned range listed, please consider follow up with your Primary Care Provider.   ________________________________________________________  The  GI providers would like to encourage you to use [facility] to communicate with providers for non-urgent requests or questions.  Due to long hold times on the telephone, sending your provider a message by [name] may be a faster and more efficient way to get a response.  Please allow 48 business hours for a response.  Please remember that this is for non-urgent requests.  _______________________________________________________  Due to recent changes in healthcare laws, you may see the results of your imaging and laboratory studies on MyChart before your provider has had a chance to review them.  We understand that in some cases there may be results that are confusing or concerning to you. Not all laboratory results come back  in the same time frame and the provider may be waiting for multiple results in order to interpret others.  Please give [place] 48 hours in order for your provider to thoroughly review all the results before contacting the office for clarification of your results.
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
## 2023-05-15 ENCOUNTER — Ambulatory Visit: Payer: Medicaid Other | Admitting: Family Medicine

## 2023-05-25 ENCOUNTER — Ambulatory Visit (HOSPITAL_COMMUNITY)
Admission: RE | Admit: 2023-05-25 | Discharge: 2023-05-25 | Disposition: A | Discharge status: Home / Self Care | Payer: Medicaid Other | Source: Ambulatory Visit | Attending: Family Medicine | Admitting: Family Medicine

## 2023-05-25 ENCOUNTER — Ambulatory Visit: Payer: Medicaid Other | Admitting: Family Medicine

## 2023-05-25 ENCOUNTER — Encounter: Payer: Self-pay | Admitting: Family Medicine

## 2023-05-25 VITALS — BP 128/83 | HR 115 | Ht 68.0 in | Wt 141.8 lb

## 2023-05-25 DIAGNOSIS — R030 Elevated blood-pressure reading, without diagnosis of hypertension: Secondary | ICD-10-CM

## 2023-05-25 DIAGNOSIS — R000 Tachycardia, unspecified: Secondary | ICD-10-CM | POA: Diagnosis not present

## 2023-05-25 DIAGNOSIS — F1010 Alcohol abuse, uncomplicated: Secondary | ICD-10-CM | POA: Diagnosis not present

## 2023-05-25 NOTE — Patient Instructions (Signed)
Everything looks good today, we will follow-up with the lab results of your tests in September

## 2023-05-25 NOTE — Progress Notes (Cosign Needed Addendum)
    SUBJECTIVE:   CHIEF COMPLAINT / HPI:   Elevated Blood Pressure - currently takes propanolol and spironolactone for his liver cirrhosis  Alcohol intake - May drink 1-2 beers a day - No episodes of binge drinking  Tachycardia - Patient reports he is not having any chest pain or shortness of breath - He was doing yardwork earlier in the heat - Does not have any symptoms and doesn't feel like it is elevated - Takes propanolol for alcoholic cirrhosis  PERTINENT  PMH / PSH: Alcoholic cirrhosis of liver, chronic hyponatremia  OBJECTIVE:   BP 128/83   Pulse (!) 115   Ht 5\' 8"  (1.727 m)   Wt 141 lb 12.8 oz (64.3 kg)   SpO2 100%   BMI 21.56 kg/m   Gen: well-appearing, NAD CV: tachycardic, no m/r/g appreciated, no peripheral edema Pulm: CTAB, no wheezes/crackles  EKG: Sinus rhythm with rate of 95bpm, QTc 432, no T wave inversions or significant ST depressions/elevations  ASSESSMENT/PLAN:   Tachycardia Patient initially presented with tachycardia to 122 and then repeat at 115.  Patient is asymptomatic with no other concerning symptoms and physical exam is largely reassuring.  EKG was completed in the office which shows normal sinus rhythm at 95 bpm.  Likely related to recent outside work activity and possibly has a touch of mild dehydration.  Patient also takes propranolol for alcoholic cirrhosis. - EKG in clinic was normal - Continue to monitor, discussed return precautions  Alcohol use  alcoholic cirrhosis Patient still doing beer daily, has not had any episodes of binge drinking.  Had GI follow up last month and is stable on his Lasix, spironolactone, propranolol. - Continued to encourage safe alcohol cessation - Patient has lab work follow-up in September with right upper quadrant ultrasound, defer labs at this time unless specific concerns  Elevated blood pressure Blood pressure initially elevated, repeat was significantly improved.  Will continue to monitor at this  time.  Patient is already on spironolactone, Lasix, propranolol for alcoholic cirrhosis treatment.  Evelena Leyden, DO [facility]

## 2023-06-22 ENCOUNTER — Other Ambulatory Visit: Payer: Self-pay | Admitting: Podiatry

## 2023-06-22 DIAGNOSIS — B353 Tinea pedis: Secondary | ICD-10-CM

## 2023-06-25 NOTE — Telephone Encounter (Signed)
D/C by PCP noting course is complete

## 2023-07-02 ENCOUNTER — Ambulatory Visit: Payer: Medicaid Other | Admitting: Podiatry

## 2023-07-02 DIAGNOSIS — B351 Tinea unguium: Secondary | ICD-10-CM | POA: Diagnosis not present

## 2023-07-02 NOTE — Progress Notes (Signed)
    Subjective:  Patient ID: Stephen Carroll, male    DOB: [birth year],  MRN: [id number]  Stephen Carroll presents to clinic today for:  Chief Complaint  Patient presents with   Nail Problem    Dfc with one callus on his right foot   . Patient notes nails are thick and elongated, causing pain in shoe gear when ambulating.  Patient has had a previous left second toe amputation (Q7).  He also has concern of painful calluses on the distal fifth toe bilateral and plantar medial aspect of the hallux bilateral  PCP is Jerre Simon, MD.  No Known Allergies  Review of Systems: Negative except as noted in the HPI.  Objective:  There were no vitals filed for this visit.  Stephen Carroll is a pleasant 57 y.o. male in NAD. AAO x 3.  Vascular Examination: Patient has palpable DP pulse, absent PT pulse bilateral.  Delayed capillary refill bilateral toes.  Sparse digital hair bilateral.  Proximal to distal cooling WNL bilateral.    Dermatological Examination: Interspaces are clear with no open lesions noted bilateral.  Nails are 3-23mm thick, with yellowish/brown discoloration, subungual debris and distal onycholysis x10.  There is pain with compression of nails x10.  There are hyperkeratotic lesions noted plantar medial aspect of hallux IPJ bilateral as well as the distal lateral aspect of the fifth toe bilateral..  Neurological Examination: Protective sensation intact b/l LE. Vibratory sensation intact b/l LE.  Musculoskeletal Examination: Muscle strength 5/5 to all LE muscle groups b/l.  Adductovarus deformity bilateral fifth toe.  Patient qualifies for at-risk foot care because of previous left second toe amputation.  Assessment/Plan: 1. Dermatophytosis of nail    Mycotic nails x10 were sharply debrided with sterile nail nippers and power debriding burr to decrease bulk and length.  Hyperkeratotic lesions x 4 were shaved with #312 blade.  Return in about 3 months (around 10/02/2023)  for RFC.   [name], DPM, FACFAS [facility]     [address] [place], [place] [address]                Office [phone]  Fax [phone]

## 2023-07-12 ENCOUNTER — Telehealth: Payer: Self-pay | Admitting: Gastroenterology

## 2023-07-12 DIAGNOSIS — Z86010 Personal history of colonic polyps: Secondary | ICD-10-CM

## 2023-07-12 DIAGNOSIS — K7030 Alcoholic cirrhosis of liver without ascites: Secondary | ICD-10-CM

## 2023-07-12 MED ORDER — ONDANSETRON HCL 4 MG PO TABS: 4.0000 mg | ORAL_TABLET | Freq: Three times a day (TID) | ORAL | 0 refills | Status: DC | PRN

## 2023-07-12 NOTE — Telephone Encounter (Signed)
#  20 sent to pharmacy.  Patient should schedule an office visit for further refills

## 2023-07-12 NOTE — Telephone Encounter (Signed)
Inbound call from patient's sister requesting a refill for ondansetron. Please advise, thank you.

## 2023-07-31 ENCOUNTER — Telehealth: Payer: Self-pay

## 2023-07-31 NOTE — Telephone Encounter (Signed)
Scheduled patient for RUQ U/S Tuesday, 9-3, at 9:30am, patient to arrive 9:15am and NPO midnight. Will needs labs at that time Patient scheduled for OV with [facility] on 11-7 at 11am.  Letter mailed to patient with appointment information

## 2023-07-31 NOTE — Telephone Encounter (Signed)
-----   Message from Nurse Deno Etienne sent at 04/13/2023  4:10 PM EDT ----- Pt needs follow up with dr Adela Lank around 10/2023 for 6 month follow up cirrhosis (recall placed in system). Pt needs labs (cbc, cmet, pt/inr, afp) drawn around 08/12/23--orders already in epic. Pt needs ruq abdominal ultrasound f/u cirrhosis/hcc screen around 08/12/23. Order already entered in epic. Pt just needs reminded and ultrasound/ov scheduled. See office note dated 04/13/23 for original request.

## 2023-08-10 ENCOUNTER — Other Ambulatory Visit: Payer: Medicaid Other

## 2023-08-10 ENCOUNTER — Other Ambulatory Visit: Payer: Self-pay

## 2023-08-10 ENCOUNTER — Ambulatory Visit (HOSPITAL_COMMUNITY)
Admission: RE | Admit: 2023-08-10 | Discharge: 2023-08-10 | Disposition: A | Discharge status: Home / Self Care | Payer: Medicaid Other | Source: Ambulatory Visit | Attending: Gastroenterology | Admitting: Gastroenterology

## 2023-08-10 DIAGNOSIS — K7030 Alcoholic cirrhosis of liver without ascites: Secondary | ICD-10-CM

## 2023-08-10 DIAGNOSIS — R932 Abnormal findings on diagnostic imaging of liver and biliary tract: Secondary | ICD-10-CM | POA: Diagnosis not present

## 2023-08-10 DIAGNOSIS — I8500 Esophageal varices without bleeding: Secondary | ICD-10-CM | POA: Insufficient documentation

## 2023-08-10 LAB — CBC WITH DIFFERENTIAL/PLATELET
Basophils Absolute: 0.1 10*3/uL (ref 0.0–0.1)
Basophils Relative: 1.3 % (ref 0.0–3.0)
Eosinophils Absolute: 0 10*3/uL (ref 0.0–0.7)
Eosinophils Relative: 1.2 % (ref 0.0–5.0)
HCT: 34.6 % — ABNORMAL LOW (ref 39.0–52.0)
Hemoglobin: 11.3 g/dL — ABNORMAL LOW (ref 13.0–17.0)
Lymphocytes Relative: 38.9 % (ref 12.0–46.0)
Lymphs Abs: 1.5 10*3/uL (ref 0.7–4.0)
MCHC: 32.6 g/dL (ref 30.0–36.0)
MCV: 82.9 fl (ref 78.0–100.0)
Monocytes Absolute: 0.6 10*3/uL (ref 0.1–1.0)
Monocytes Relative: 14.6 % — ABNORMAL HIGH (ref 3.0–12.0)
Neutro Abs: 1.7 10*3/uL (ref 1.4–7.7)
Neutrophils Relative %: 44 % (ref 43.0–77.0)
Platelets: 180 10*3/uL (ref 150.0–400.0)
RBC: 4.18 Mil/uL — ABNORMAL LOW (ref 4.22–5.81)
RDW: 16.1 % — ABNORMAL HIGH (ref 11.5–15.5)
WBC: 3.9 10*3/uL — ABNORMAL LOW (ref 4.0–10.5)

## 2023-08-10 LAB — COMPREHENSIVE METABOLIC PANEL
ALT: 22 U/L (ref 0–53)
AST: 35 U/L (ref 0–37)
Albumin: 4.1 g/dL (ref 3.5–5.2)
Alkaline Phosphatase: 62 U/L (ref 39–117)
BUN: 5 mg/dL — ABNORMAL LOW (ref 6–23)
CO2: 23 mEq/L (ref 19–32)
Calcium: 9.5 mg/dL (ref 8.4–10.5)
Chloride: 98 mEq/L (ref 96–112)
Creatinine, Ser: 0.69 mg/dL (ref 0.40–1.50)
GFR: 103.54 mL/min (ref >60.00)
Glucose, Bld: 83 mg/dL (ref 70–99)
Potassium: 4 mEq/L (ref 3.5–5.1)
Sodium: 133 mEq/L — ABNORMAL LOW (ref 135–145)
Total Bilirubin: 0.8 mg/dL (ref 0.2–1.2)
Total Protein: 8.9 g/dL — ABNORMAL HIGH (ref 6.0–8.3)

## 2023-08-10 LAB — PROTIME-INR
INR: 1.2 ratio — ABNORMAL HIGH (ref 0.8–1.0)
Prothrombin Time: 13.1 s (ref 9.6–13.1)

## 2023-08-14 ENCOUNTER — Ambulatory Visit (HOSPITAL_COMMUNITY): Payer: Medicaid Other

## 2023-08-14 LAB — AFP TUMOR MARKER: AFP-Tumor Marker: 13.5 ng/mL — ABNORMAL HIGH (ref <6.1)

## 2023-09-24 ENCOUNTER — Telehealth: Payer: Self-pay | Admitting: Gastroenterology

## 2023-09-24 DIAGNOSIS — K7030 Alcoholic cirrhosis of liver without ascites: Secondary | ICD-10-CM

## 2023-09-24 NOTE — Telephone Encounter (Signed)
Called and spoke to patient's sister, Sue Lush.  She indicates that patient is congested. I asked if he is taking Zofran and how often. It turns out that he is not taking Zofran because he is out of it but he indicates he is nauseated some mornings and would like a refill of the ODT tablets.  Patient's sister was instructed that he can take the Mucinex since he is currently not taking Zofran. I will send request to Dr. Adela Lank for refill of ODT tablets and will advise patient of his response. In the meantime, the patient's sister will call the pharmacist to inquire regarding serious interaction possible with zofran and mucinex. She understands that he should not to take them together until she has spoken to the pharmacist. Dr. Adela Lank, ok to refill 4 mg ODT Zofran. Please specify # and refills. Thank you

## 2023-09-24 NOTE — Telephone Encounter (Signed)
Patients sister called to ask if she can give the patient Mucinex for congestion since he is on Zofran. Please advise.

## 2023-09-25 MED ORDER — ONDANSETRON 4 MG PO TBDP
4.0000 mg | ORAL_TABLET | Freq: Three times a day (TID) | ORAL | 1 refills | Status: DC | PRN
Start: 2023-09-25 — End: 2024-04-05

## 2023-09-25 NOTE — Telephone Encounter (Signed)
Refill of zofran sent to pharmacy.

## 2023-09-25 NOTE — Addendum Note (Signed)
Addended by: Cooper Render on: 09/25/2023 09:28 AM   Modules accepted: Orders

## 2023-09-25 NOTE — Telephone Encounter (Signed)
We can refill his Zofran.  I am otherwise not seeing any medication interactions between Mucinex / Zofran in the system, but agree they should double check with the pharmacist to make sure okay, not sure who told them there was an interaction. Thanks

## 2023-10-01 ENCOUNTER — Ambulatory Visit (INDEPENDENT_AMBULATORY_CARE_PROVIDER_SITE_OTHER): Payer: Medicaid Other | Admitting: Podiatry

## 2023-10-01 DIAGNOSIS — D492 Neoplasm of unspecified behavior of bone, soft tissue, and skin: Secondary | ICD-10-CM | POA: Diagnosis not present

## 2023-10-01 DIAGNOSIS — M79674 Pain in right toe(s): Secondary | ICD-10-CM | POA: Diagnosis not present

## 2023-10-01 DIAGNOSIS — M79675 Pain in left toe(s): Secondary | ICD-10-CM

## 2023-10-01 DIAGNOSIS — B351 Tinea unguium: Secondary | ICD-10-CM | POA: Diagnosis not present
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
## 2023-10-03 ENCOUNTER — Ambulatory Visit: Payer: Medicaid Other | Admitting: Podiatry

## 2023-10-18 ENCOUNTER — Ambulatory Visit: Payer: Medicaid Other | Admitting: Gastroenterology

## 2023-10-22 ENCOUNTER — Other Ambulatory Visit: Payer: Self-pay | Admitting: Gastroenterology

## 2023-10-22 ENCOUNTER — Other Ambulatory Visit: Payer: Self-pay | Admitting: Family Medicine

## 2023-10-22 DIAGNOSIS — K7030 Alcoholic cirrhosis of liver without ascites: Secondary | ICD-10-CM

## 2023-10-29 ENCOUNTER — Encounter: Payer: Self-pay | Admitting: Gastroenterology

## 2023-10-29 ENCOUNTER — Ambulatory Visit: Payer: Medicaid Other | Admitting: Gastroenterology

## 2023-10-29 VITALS — BP 118/70 | HR 80 | Ht 68.0 in | Wt 142.0 lb

## 2023-10-29 DIAGNOSIS — R0982 Postnasal drip: Secondary | ICD-10-CM | POA: Diagnosis not present

## 2023-10-29 DIAGNOSIS — K7030 Alcoholic cirrhosis of liver without ascites: Secondary | ICD-10-CM

## 2023-10-29 DIAGNOSIS — I8510 Secondary esophageal varices without bleeding: Secondary | ICD-10-CM

## 2023-10-29 DIAGNOSIS — I8500 Esophageal varices without bleeding: Secondary | ICD-10-CM

## 2023-10-29 MED ORDER — IPRATROPIUM BROMIDE 0.03 % NA SOLN: 2.0000 | NASAL | 1 refills | Status: DC | PRN
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
## 2023-10-29 NOTE — Patient Instructions (Addendum)
You will be due for liver ultrasound and labs in February. We will remind you when it is time to go.  Continue medications.  We have sent the following medications to your pharmacy for you to pick up at your convenience: Atrovent nasal spray: Use 2 sprays in each nostril as needed for post-nasal drip  Thank you for entrusting me with your care and for choosing [facility], Dr. Ileene Patrick    If your blood pressure at your visit was 140/90 or greater, please contact your primary care physician to follow up on this. ______________________________________________________  If you are age 56 or older, your body mass index should be between 23-30. Your Body mass index is 21.59 kg/m. If this is out of the aforementioned range listed, please consider follow up with your Primary Care Provider.  If you are age 67 or younger, your body mass index should be between 19-25. Your Body mass index is 21.59 kg/m. If this is out of the aformentioned range listed, please consider follow up with your Primary Care Provider.  ________________________________________________________  The [facility] providers would like to encourage you to use [facility] to communicate with providers for non-urgent requests or questions.  Due to long hold times on the telephone, sending your provider a message by [name] may be a faster and more efficient way to get a response.  Please allow 48 business hours for a response.  Please remember that this is for non-urgent requests.  _______________________________________________________  Due to recent changes in healthcare laws, you may see the results of your imaging and laboratory studies on MyChart before your provider has had a chance to review them.  We understand that in some cases there may be results that are confusing or concerning to you. Not all laboratory results come back in the same time frame and the provider may be waiting for multiple results in order to  interpret others.  Please give [place] 48 hours in order for your provider to thoroughly review all the results before contacting the office for clarification of your results.

## 2023-11-21 ENCOUNTER — Other Ambulatory Visit: Payer: Self-pay | Admitting: Gastroenterology

## 2023-11-21 NOTE — Telephone Encounter (Signed)
Called and spoke to patient.  He is not sure if he is using this or not.  He thought it was indigestion.  Asked patient repeatedly if he is using a nasal spray for post nasal drip and patient would not answer definitively.  He said he would call me back

## 2023-12-14 ENCOUNTER — Encounter: Payer: Self-pay | Admitting: Podiatry

## 2023-12-31 ENCOUNTER — Ambulatory Visit: Payer: Medicaid Other | Admitting: Podiatry

## 2023-12-31 ENCOUNTER — Encounter: Payer: Self-pay | Admitting: Podiatry

## 2023-12-31 VITALS — Ht 68.0 in

## 2023-12-31 DIAGNOSIS — B351 Tinea unguium: Secondary | ICD-10-CM | POA: Diagnosis not present

## 2023-12-31 DIAGNOSIS — M79675 Pain in left toe(s): Secondary | ICD-10-CM | POA: Diagnosis not present

## 2023-12-31 DIAGNOSIS — D492 Neoplasm of unspecified behavior of bone, soft tissue, and skin: Secondary | ICD-10-CM | POA: Diagnosis not present

## 2023-12-31 DIAGNOSIS — M79674 Pain in right toe(s): Secondary | ICD-10-CM | POA: Diagnosis not present
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
## 2024-01-01 ENCOUNTER — Ambulatory Visit: Payer: Medicaid Other | Admitting: Podiatry

## 2024-01-14 ENCOUNTER — Telehealth: Payer: Self-pay

## 2024-01-14 DIAGNOSIS — I8500 Esophageal varices without bleeding: Secondary | ICD-10-CM

## 2024-01-14 DIAGNOSIS — R772 Abnormality of alphafetoprotein: Secondary | ICD-10-CM

## 2024-01-14 DIAGNOSIS — K7030 Alcoholic cirrhosis of liver without ascites: Secondary | ICD-10-CM

## 2024-01-14 NOTE — Telephone Encounter (Signed)
-----   Message from [facility] sent at 10/29/2023  4:31 PM EST ----- Regarding: RUQ and labs due in FEb Patient due for CBC,  CMET, INR/P , AFP and RUQ U/S end of Feb for cirrhosis

## 2024-01-14 NOTE — Telephone Encounter (Signed)
Orders placed for labs and RUQ U/S.  Scheduled patient for U/S at [facility] on Feb. 24 10:30 am arr 10:15 arrival NPO 6 hours.   Called and spoke to patient's brother.  He is not sure that date and time works for him.  He said he will call [phone] to reschedule if he needs to. He understands that he will need to go to the lab for blood work whenever he does his ultrasound.

## 2024-01-14 NOTE — Telephone Encounter (Signed)
Letter mailed to patient with appointment info and # to call if need to reschedule

## 2024-01-20 ENCOUNTER — Emergency Department (HOSPITAL_COMMUNITY)
Admission: EM | Admit: 2024-01-20 | Discharge: 2024-01-20 | Disposition: A | Discharge status: Home / Self Care | Payer: Medicaid Other

## 2024-01-20 ENCOUNTER — Other Ambulatory Visit: Payer: Self-pay

## 2024-01-20 ENCOUNTER — Encounter (HOSPITAL_COMMUNITY): Payer: Self-pay

## 2024-01-20 DIAGNOSIS — E871 Hypo-osmolality and hyponatremia: Secondary | ICD-10-CM | POA: Diagnosis not present

## 2024-01-20 DIAGNOSIS — R066 Hiccough: Secondary | ICD-10-CM | POA: Diagnosis not present

## 2024-01-20 DIAGNOSIS — D72819 Decreased white blood cell count, unspecified: Secondary | ICD-10-CM | POA: Diagnosis not present

## 2024-01-20 LAB — CBC WITH DIFFERENTIAL/PLATELET
Abs Immature Granulocytes: 0.01 10*3/uL (ref 0.00–0.07)
Basophils Absolute: 0.1 10*3/uL (ref 0.0–0.1)
Basophils Relative: 1 %
Eosinophils Absolute: 0.1 10*3/uL (ref 0.0–0.5)
Eosinophils Relative: 2 %
HCT: 31.3 % — ABNORMAL LOW (ref 39.0–52.0)
Hemoglobin: 10.7 g/dL — ABNORMAL LOW (ref 13.0–17.0)
Immature Granulocytes: 0 %
Lymphocytes Relative: 41 %
Lymphs Abs: 1.5 10*3/uL (ref 0.7–4.0)
MCH: 26 pg (ref 26.0–34.0)
MCHC: 34.2 g/dL (ref 30.0–36.0)
MCV: 76.2 fL — ABNORMAL LOW (ref 80.0–100.0)
Monocytes Absolute: 0.5 10*3/uL (ref 0.1–1.0)
Monocytes Relative: 12 %
Neutro Abs: 1.6 10*3/uL — ABNORMAL LOW (ref 1.7–7.7)
Neutrophils Relative %: 44 %
Platelets: 165 10*3/uL (ref 150–400)
RBC: 4.11 MIL/uL — ABNORMAL LOW (ref 4.22–5.81)
RDW: 16.3 % — ABNORMAL HIGH (ref 11.5–15.5)
WBC: 3.6 10*3/uL — ABNORMAL LOW (ref 4.0–10.5)
nRBC: 0 % (ref 0.0–0.2)

## 2024-01-20 LAB — BASIC METABOLIC PANEL
Anion gap: 10 (ref 5–15)
BUN: <5 mg/dL — ABNORMAL LOW (ref 6–20)
CO2: 22 mmol/L (ref 22–32)
Calcium: 8.7 mg/dL — ABNORMAL LOW (ref 8.9–10.3)
Chloride: 93 mmol/L — ABNORMAL LOW (ref 98–111)
Creatinine, Ser: 0.66 mg/dL (ref 0.61–1.24)
GFR, Estimated: >60 mL/min (ref >60)
Glucose, Bld: 111 mg/dL — ABNORMAL HIGH (ref 70–99)
Potassium: 3.7 mmol/L (ref 3.5–5.1)
Sodium: 125 mmol/L — ABNORMAL LOW (ref 135–145)

## 2024-01-20 LAB — TROPONIN I (HIGH SENSITIVITY)
Troponin I (High Sensitivity): 7 ng/L (ref <18)
Troponin I (High Sensitivity): 5 ng/L (ref <18)

## 2024-01-20 NOTE — ED Triage Notes (Signed)
 Pt has had intermittent hiccups for 3 days now and it is bothering him. Pt states it happened before and he was given medicine.

## 2024-01-20 NOTE — ED Provider Notes (Signed)
 [place] EMERGENCY DEPARTMENT AT [facility] Provider Note   CSN: [id number] Arrival date & time: 01/20/24  1334     History  Chief Complaint  Patient presents with   Hiccups    Stephen Carroll is a 58 y.o. male.  This is a 58 year old male presenting emergency department for hiccups.  Reports that he had intermittent hiccups over the past 3 days.  Primarily after he eats.  He is not currently having hiccups as they resolved shortly prior to arrival.  He otherwise has no symptoms or complaints.        Home Medications Prior to Admission medications   Medication Sig Start Date End Date Taking? Authorizing Provider  benzonatate  (TESSALON ) 100 MG capsule Take 1 capsule (100 mg total) by mouth 2 (two) times daily as needed for cough. 04/10/23   Macario Dorothyann HERO, MD  fluticasone  (FLONASE ) 50 MCG/ACT nasal spray Place 2 sprays into both nostrils daily. 04/10/23   Macario Dorothyann HERO, MD  folic acid  (FOLVITE ) 1 MG tablet Take 1 tablet (1 mg total) by mouth daily. 04/10/23   Macario Dorothyann HERO, MD  furosemide  (LASIX ) 40 MG tablet Take 1 tablet (40 mg total) by mouth daily. 04/10/23   Macario Dorothyann HERO, MD  ipratropium (ATROVENT ) 0.03 % nasal spray Place 2 sprays into both nostrils as needed for rhinitis. 10/29/23   Armbruster, Elspeth SQUIBB, MD  Multiple Vitamin (MULTIVITAMIN WITH MINERALS) TABS tablet Take 1 tablet by mouth daily. 04/10/23   Macario Dorothyann HERO, MD  ondansetron  (ZOFRAN -ODT) 4 MG disintegrating tablet Take 1 tablet (4 mg total) by mouth every 8 (eight) hours as needed for nausea or vomiting. 09/25/23   Armbruster, Elspeth SQUIBB, MD  propranolol  (INDERAL ) 20 MG tablet TAKE 1 TABLET BY MOUTH TWICE A DAY 10/24/23   Rosendo Rush, MD  spironolactone  (ALDACTONE ) 100 MG tablet Take 1 tablet (100 mg total) by mouth daily. 04/10/23   Macario Dorothyann HERO, MD  thiamine  (VITAMIN B1) 100 MG tablet Take 1 tablet (100 mg total) by mouth daily. 04/10/23   Macario Dorothyann HERO, MD      Allergies     Patient has no known allergies.    Review of Systems   Review of Systems  Physical Exam Updated Vital Signs BP 123/85 (BP Location: Left Arm)   Pulse 75   Temp 98.2 F (36.8 C)   Resp 18   Ht 5' 8 (1.727 m)   Wt 69.9 kg   SpO2 99%   BMI 23.42 kg/m  Physical Exam Vitals and nursing note reviewed.  HENT:     Head: Normocephalic.  Eyes:     Conjunctiva/sclera: Conjunctivae normal.  Cardiovascular:     Rate and Rhythm: Normal rate and regular rhythm.  Pulmonary:     Effort: Pulmonary effort is normal.  Abdominal:     General: Abdomen is flat. There is no distension.     Tenderness: There is no abdominal tenderness. There is no guarding or rebound.  Musculoskeletal:        General: Normal range of motion.  Skin:    General: Skin is warm.     Capillary Refill: Capillary refill takes less than 2 seconds.  Neurological:     General: No focal deficit present.  Psychiatric:        Mood and Affect: Mood normal.        Behavior: Behavior normal.     ED Results / Procedures / Treatments   Labs (all labs ordered are  listed, but only abnormal results are displayed) Labs Reviewed  BASIC METABOLIC PANEL - Abnormal; Notable for the following components:      Result Value   Sodium 125 (*)    Chloride 93 (*)    Glucose, Bld 111 (*)    BUN <5 (*)    Calcium  8.7 (*)    All other components within normal limits  CBC WITH DIFFERENTIAL/PLATELET - Abnormal; Notable for the following components:   WBC 3.6 (*)    RBC 4.11 (*)    Hemoglobin 10.7 (*)    HCT 31.3 (*)    MCV 76.2 (*)    RDW 16.3 (*)    Neutro Abs 1.6 (*)    All other components within normal limits  TROPONIN I (HIGH SENSITIVITY)  TROPONIN I (HIGH SENSITIVITY)   EKG None  Radiology No results found.  Procedures Procedures    Medications Ordered in ED Medications - No data to display  ED Course/ Medical Decision Making/ A&P Clinical Course as of 01/20/24 2024  Sun Jan 20, 2024  1711 CBC with  Differential(!) Stable anemia.  Leukopenia also noted, but present on prior labs [TY]  1711 Basic metabolic panel(!) Hyponatremia noted at 125.  Per chart review patient with chronic hyponatremia.  Per chart review was admitted back in March noted to have chronic hyponatremia thought to be secondary to beer [place] mania.  However most recent showed sodium level 133 134. [TY]  1725 Discussed case with hospitalist who will see patient.  Plan to admit for observation to ensure hyponatremia does not worsen. [TY]  1731 Hospitalist evaluated patient.  Patient refused admission, has appointment tomorrow with podiatrist and primary doc for the next day or 2.  Will discharge at patient's request.  He is alert and oriented x 3 and asymptomatic.  Not unreasonable to discharge given the chronicity of his hyponatremia. [TY]    Clinical Course User Index [TY] Neysa Caron PARAS, DO                                 Medical Decision Making This is a well-appearing 58 year old male presenting emergency department for hiccups.  He is afebrile nontachycardic hemodynamically stable.  His hiccups have resolved.  He does not have any other complaint currently.  Not having chest pain shortness of breath.  No abdominal pain.  No abdominal tenderness on exam.  Does have a history of alcohol abuse and liver cirrhosis. See ed course for further MDM/disposition.   Amount and/or Complexity of Data Reviewed Labs:  Decision-making details documented in ED Course.         Final Clinical Impression(s) / ED Diagnoses Final diagnoses:  Hiccups  Hyponatremia    Rx / DC Orders ED Discharge Orders     None         Neysa Caron PARAS, DO 01/20/24 2024

## 2024-01-20 NOTE — ED Provider Triage Note (Signed)
 Emergency Medicine Provider Triage Evaluation Note  Stephen Carroll , a 58 y.o. male  was evaluated in triage.  Pt complains of intermittent hiccups x 3 days.  Review of Systems  Positive: Hiccups Negative: Fever, chills, shortness of breath, chest pain, weakness, vomiting, nausea  Physical Exam  BP 115/79 (BP Location: Right Arm)   Pulse 85   Temp 98.2 F (36.8 C)   Resp 16   Ht 5' 8 (1.727 m)   Wt 69.9 kg   SpO2 100%   BMI 23.42 kg/m  Gen:   Awake, no distress   Resp:  Normal effort  MSK:   Moves extremities without difficulty  Other:  Hiccups on exam  Medical Decision Making  Medically screening exam initiated at 2:14 PM.  Appropriate orders placed.  Stein E Marshburn was informed that the remainder of the evaluation will be completed by another provider, this initial triage assessment does not replace that evaluation, and the importance of remaining in the ED until their evaluation is complete.  Labs ordered   Francis Ileana SAILOR, PA-C 01/20/24 1418

## 2024-01-20 NOTE — Discharge Instructions (Addendum)
 Please return if develop any fevers, chills, sudden onset headache, chest pain, shortness of breath, abdominal pain inability drink due to nausea vomiting or any new or worsening symptoms that are concerning to you.

## 2024-01-21 ENCOUNTER — Ambulatory Visit (HOSPITAL_COMMUNITY)
Admission: RE | Admit: 2024-01-21 | Discharge: 2024-01-21 | Disposition: A | Discharge status: Home / Self Care | Payer: Medicaid Other | Source: Ambulatory Visit | Attending: Gastroenterology | Admitting: Gastroenterology

## 2024-01-21 ENCOUNTER — Other Ambulatory Visit (INDEPENDENT_AMBULATORY_CARE_PROVIDER_SITE_OTHER): Payer: Medicaid Other

## 2024-01-21 ENCOUNTER — Telehealth: Payer: Self-pay | Admitting: Gastroenterology

## 2024-01-21 DIAGNOSIS — R772 Abnormality of alphafetoprotein: Secondary | ICD-10-CM | POA: Diagnosis not present

## 2024-01-21 DIAGNOSIS — K828 Other specified diseases of gallbladder: Secondary | ICD-10-CM | POA: Diagnosis not present

## 2024-01-21 DIAGNOSIS — K7030 Alcoholic cirrhosis of liver without ascites: Secondary | ICD-10-CM | POA: Diagnosis present

## 2024-01-21 DIAGNOSIS — I8500 Esophageal varices without bleeding: Secondary | ICD-10-CM

## 2024-01-21 LAB — COMPREHENSIVE METABOLIC PANEL
ALT: 33 U/L (ref 0–53)
AST: 51 U/L — ABNORMAL HIGH (ref 0–37)
Albumin: 4.4 g/dL (ref 3.5–5.2)
Alkaline Phosphatase: 71 U/L (ref 39–117)
BUN: 4 mg/dL — ABNORMAL LOW (ref 6–23)
CO2: 26 meq/L (ref 19–32)
Calcium: 10.1 mg/dL (ref 8.4–10.5)
Chloride: 92 meq/L — ABNORMAL LOW (ref 96–112)
Creatinine, Ser: 0.67 mg/dL (ref 0.40–1.50)
GFR: 104.13 mL/min (ref >60.00)
Glucose, Bld: 103 mg/dL — ABNORMAL HIGH (ref 70–99)
Potassium: 4 meq/L (ref 3.5–5.1)
Sodium: 130 meq/L — ABNORMAL LOW (ref 135–145)
Total Bilirubin: 1.2 mg/dL (ref 0.2–1.2)
Total Protein: 9.7 g/dL — ABNORMAL HIGH (ref 6.0–8.3)

## 2024-01-21 LAB — CBC WITH DIFFERENTIAL/PLATELET
Basophils Absolute: 0.1 10*3/uL (ref 0.0–0.1)
Basophils Relative: 1.1 % (ref 0.0–3.0)
Eosinophils Absolute: 0 10*3/uL (ref 0.0–0.7)
Eosinophils Relative: 0.2 % (ref 0.0–5.0)
HCT: 37.5 % — ABNORMAL LOW (ref 39.0–52.0)
Hemoglobin: 12.3 g/dL — ABNORMAL LOW (ref 13.0–17.0)
Lymphocytes Relative: 24.7 % (ref 12.0–46.0)
Lymphs Abs: 1.2 10*3/uL (ref 0.7–4.0)
MCHC: 32.9 g/dL (ref 30.0–36.0)
MCV: 80.7 fL (ref 78.0–100.0)
Monocytes Absolute: 0.5 10*3/uL (ref 0.1–1.0)
Monocytes Relative: 10.3 % (ref 3.0–12.0)
Neutro Abs: 3.2 10*3/uL (ref 1.4–7.7)
Neutrophils Relative %: 63.7 % (ref 43.0–77.0)
Platelets: 185 10*3/uL (ref 150.0–400.0)
RBC: 4.65 Mil/uL (ref 4.22–5.81)
RDW: 16.3 % — ABNORMAL HIGH (ref 11.5–15.5)
WBC: 5 10*3/uL (ref 4.0–10.5)

## 2024-01-21 LAB — PROTIME-INR
INR: 1.2 {ratio} — ABNORMAL HIGH (ref 0.8–1.0)
Prothrombin Time: 13 s (ref 9.6–13.1)

## 2024-01-21 NOTE — Telephone Encounter (Signed)
 Inbound call from patient's sister stating patient was recently at the ED for hiccups and chest congestion. Requesting a call to discuss options to help. Please advise, thank you.

## 2024-01-22 LAB — AFP TUMOR MARKER: AFP-Tumor Marker: 14 ng/mL — ABNORMAL HIGH (ref <6.1)

## 2024-01-22 NOTE — Telephone Encounter (Signed)
Returned call to patient's sister. Based on ER notes hiccups had resolved. Sue Lush reported that patient has chest congestion and coughs up phlegm. I informed Sue Lush that patient will need to reach out to PCP for evaluation, he may need referral to pulmonology. I provided Sue Lush with the information to [facility] where patient was last seen. Sue Lush verbalized understanding and had no concerns at the end of the call.

## 2024-01-23 ENCOUNTER — Telehealth: Payer: Self-pay | Admitting: Gastroenterology

## 2024-01-23 NOTE — Telephone Encounter (Signed)
error 

## 2024-01-25 ENCOUNTER — Other Ambulatory Visit: Payer: Self-pay

## 2024-01-25 ENCOUNTER — Telehealth: Payer: Self-pay | Admitting: Gastroenterology

## 2024-01-25 DIAGNOSIS — K7030 Alcoholic cirrhosis of liver without ascites: Secondary | ICD-10-CM

## 2024-01-25 MED ORDER — FUROSEMIDE 40 MG PO TABS: 40.0000 mg | ORAL_TABLET | Freq: Every day | ORAL | 0 refills | Status: DC

## 2024-01-25 NOTE — Telephone Encounter (Signed)
Patient brother called and stated that he would like to know if his brother is needing another MRI. Patient brother is requesting a call back. Please advise.

## 2024-01-25 NOTE — Telephone Encounter (Signed)
Spoke with Tiburcio Bash (brother) to advise that MRI will be needed in about 3 months (at which time our office will reach out to schedule). Also advised that patient is scheduled for follow up in the office with Dr Adela Lank on 04/25/24 and should keep this appointment. Reminded Tiburcio Bash that patient needs to follow with his PCP for testicular exam given recent elevated AFP and possible association with testicular tumors. Reginald verbalizes understanding.

## 2024-01-25 NOTE — Telephone Encounter (Signed)
Left message for pts brother on voicemail that pt needs repeat AFP lab in 3 mth and based on that result may have repeat MR at that time. Also left message of pts OV in May.

## 2024-02-04 ENCOUNTER — Ambulatory Visit (INDEPENDENT_AMBULATORY_CARE_PROVIDER_SITE_OTHER): Payer: Medicaid Other | Admitting: Student

## 2024-02-04 ENCOUNTER — Other Ambulatory Visit (HOSPITAL_COMMUNITY): Payer: Medicaid Other

## 2024-02-04 ENCOUNTER — Encounter: Payer: Self-pay | Admitting: Student

## 2024-02-04 VITALS — BP 112/70 | HR 108 | Ht 68.0 in | Wt 146.2 lb

## 2024-02-04 DIAGNOSIS — D61818 Other pancytopenia: Secondary | ICD-10-CM

## 2024-02-04 DIAGNOSIS — R053 Chronic cough: Secondary | ICD-10-CM | POA: Diagnosis not present

## 2024-02-04 DIAGNOSIS — Z720 Tobacco use: Secondary | ICD-10-CM | POA: Diagnosis not present

## 2024-02-04 MED ORDER — GUAIFENESIN ER 600 MG PO TB12: 600.0000 mg | ORAL_TABLET | Freq: Two times a day (BID) | ORAL | 0 refills | Status: DC

## 2024-02-04 NOTE — Progress Notes (Signed)
    SUBJECTIVE:   CHIEF COMPLAINT / HPI: AWV  58 year old male presenting today for annual well visit.  Diet:No issues with appetite, regular  Sleep: 8-9hrs, no issues Exercise: walk daily for an hour in morning  Alcohol use: 1-2 small can of beer  Tobacco use: Never  Illicit drug ZOX:[id number] daily Sexually active:  No Works as: unemployed. Disabled  Lives with: alone  Power of Attorney:Reggie Laural Benes (Brother)  Medical concerns: chronic productive cough of clear to yellow sputum for few months. No chest pain, dyspnea.   PERTINENT  PMH / PSH: Pancytopenia, colic liver cirrhosis, alcohol use.  OBJECTIVE:   BP 112/70 (Cuff Size: Normal)   Pulse (!) 108   Ht 5\' 8"  (1.727 m)   Wt 146 lb 3.2 oz (66.3 kg)   SpO2 99%   BMI 22.23 kg/m    Physical Exam General: Alert, well appearing, NAD Cardiovascular: RRR, No Murmurs, Normal S2/S2 Respiratory: CTAB, No wheezing or Rales Abdomen: No distension or tenderness Extremities: No edema on extremities   Skin: Warm and dry Neuro: ANO x 3, no focal deficits  ASSESSMENT/PLAN:   58 year old male for annual well visit.  Annual Physical Reviewed patient's Family Medical History Reviewed and updated list of patient's medical providers Counseled patient on tobacco, alcohol use Recommend moderate exercise at least 150 hours a week Emphasized need for healthy dieting Assessment of cognitive impairment was done Assessed patient's functional ability Health Risk Assessent Completed and Reviewed.  Pancytopenia Concerns for active bleeding or easy bruising.   - Ordered lab for CBC, BMP.  Chronic cough Present for 4 months without constitutional symptoms and so no concerns for infectious process at this time.  Clear history of patient's tobacco use however given chronic nature of cough will obtain checks x-ray to evaluate for other possible etiologies.  Sent in prescription for Mucinex to help with sputum production.   Jerre Simon,  MD [facility]

## 2024-02-04 NOTE — Patient Instructions (Addendum)
 Great to see you today.  Today we ordered lab to check your blood count, electrolytes and kidney function.  For your chronic cough I placed order for chest CT to look at your lungs.  Will call you to schedule an appointment once this is approved by your insurance.  Also have sent in prescription for Mucinex which you should take 2 times daily to help with your cough.

## 2024-02-05 LAB — CBC
Hematocrit: 36.3 % — ABNORMAL LOW (ref 37.5–51.0)
Hemoglobin: 11.7 g/dL — ABNORMAL LOW (ref 13.0–17.7)
MCH: 26.2 pg — ABNORMAL LOW (ref 26.6–33.0)
MCHC: 32.2 g/dL (ref 31.5–35.7)
MCV: 81 fL (ref 79–97)
Platelets: 230 10*3/uL (ref 150–450)
RBC: 4.46 x10E6/uL (ref 4.14–5.80)
RDW: 15.5 % — ABNORMAL HIGH (ref 11.6–15.4)
WBC: 3.6 10*3/uL (ref 3.4–10.8)

## 2024-02-05 LAB — BASIC METABOLIC PANEL
BUN/Creatinine Ratio: 5 — ABNORMAL LOW (ref 9–20)
BUN: 3 mg/dL — ABNORMAL LOW (ref 6–24)
CO2: 23 mmol/L (ref 20–29)
Calcium: 9.5 mg/dL (ref 8.7–10.2)
Chloride: 96 mmol/L (ref 96–106)
Creatinine, Ser: 0.64 mg/dL — ABNORMAL LOW (ref 0.76–1.27)
Glucose: 84 mg/dL (ref 70–99)
Potassium: 4.3 mmol/L (ref 3.5–5.2)
Sodium: 136 mmol/L (ref 134–144)
eGFR: 111 mL/min/{1.73_m2} (ref >59)

## 2024-02-11 ENCOUNTER — Telehealth: Payer: Self-pay | Admitting: Gastroenterology

## 2024-02-11 DIAGNOSIS — K7030 Alcoholic cirrhosis of liver without ascites: Secondary | ICD-10-CM

## 2024-02-11 DIAGNOSIS — R772 Abnormality of alphafetoprotein: Secondary | ICD-10-CM

## 2024-02-11 NOTE — Telephone Encounter (Signed)
 Patient brother called and stated that he would like his brother to have blood work order for his brother for the same day as his brother's follow up appointment. Please advise.

## 2024-02-11 NOTE — Telephone Encounter (Signed)
 Returned call to [place]. I informed him that it would be best if he could bring patient 1-2 days before his appt for lab work so that Dr. Adela Lank could review with them. If not, pt can have labs drawn day of appt. Stephen Carroll verbalized understanding and had no concerns at the end of the call.   Lab order in epic.

## 2024-03-11 DIAGNOSIS — K760 Fatty (change of) liver, not elsewhere classified: Secondary | ICD-10-CM

## 2024-03-11 HISTORY — DX: Fatty (change of) liver, not elsewhere classified: K76.0

## 2024-03-17 ENCOUNTER — Ambulatory Visit: Admitting: Podiatry

## 2024-03-17 DIAGNOSIS — D492 Neoplasm of unspecified behavior of bone, soft tissue, and skin: Secondary | ICD-10-CM | POA: Diagnosis not present

## 2024-03-17 DIAGNOSIS — M79675 Pain in left toe(s): Secondary | ICD-10-CM

## 2024-03-17 DIAGNOSIS — B351 Tinea unguium: Secondary | ICD-10-CM | POA: Diagnosis not present

## 2024-03-17 DIAGNOSIS — M79674 Pain in right toe(s): Secondary | ICD-10-CM | POA: Diagnosis not present

## 2024-03-17 NOTE — Progress Notes (Unsigned)
       Subjective:  Patient ID: Stephen Carroll, male    DOB: 12-31-1965,  MRN: [id number]  Stephen Carroll presents to clinic today for:  Chief Complaint  Patient presents with   Nail Problem    Nail trim    Patient notes nails are thick and elongated, causing pain in shoe gear when ambulating.  He also has painful skin lesions on the posterior lateral aspect of both heels, the distal-lateral aspect of the fifth toes bilateral as well as the plantar medial aspect of the hallux IPJ bilateral.  He has a previously amputated left 2nd toe  PCP is Jerre Simon, MD.  Last seen 02/04/24  Past Medical History:  Diagnosis Date   Alcohol use 1985   Alcohol withdrawal seizure (HCC)    last seizure years ago ,not sure of date   Alcoholic cirrhosis of liver with ascites (HCC) 2017   Aneurysm, cerebral, nonruptured 08/09/2015   Arthritis    Migraine 1984   Toe amputee (HCC)     No Known Allergies  Objective:  Stephen Carroll is a pleasant 58 y.o. male in NAD. AAO x 3.  Vascular Examination: Patient has palpable DP pulse, absent PT pulse bilateral.  Delayed capillary refill bilateral toes.  Sparse digital hair bilateral.  Proximal to distal cooling WNL bilateral.    Dermatological Examination: Interspaces are clear with no open lesions noted bilateral.  Skin is shiny and atrophic bilateral.  Nails are 3-45mm thick, with yellowish/brown discoloration, subungual debris and distal onycholysis x9.  There is pain with compression of nails x9.  There are hyperkeratotic lesions/skin neoplams noted posterolateral aspect of both heels, the distal-lateral aspect of the fifth toes bilateral as well as the plantar medial aspect of the hallux IPJ bilateral .  Assessment/Plan: 1. Pain due to onychomycosis of toenails of both feet   2. Skin neoplasm     Mycotic nails x9 were sharply debrided with sterile nail nippers and power debriding burr to decrease bulk and length.  The skin neoplasms x6 were  shaved with #312 blade.  Return in about 3 months (around 06/16/2024) for RFC.   [name], DPM, FACFAS [facility]     [address] [place], [place] [address]                Office [phone]  Fax [phone]

## 2024-03-26 ENCOUNTER — Telehealth: Payer: Self-pay

## 2024-03-26 DIAGNOSIS — K7030 Alcoholic cirrhosis of liver without ascites: Secondary | ICD-10-CM

## 2024-03-26 DIAGNOSIS — R772 Abnormality of alphafetoprotein: Secondary | ICD-10-CM

## 2024-03-26 NOTE — Telephone Encounter (Signed)
-----   Message from [facility] sent at 01/24/2024  4:46 PM EST ----- Regarding: MRI Liver and AFP Patient due for MRI of Liver (cirrhosis, elevated AFP, hcc screening). Schedule in early May to have results for May 16 appointment.  Have patient go to the lab for AFP before appointment

## 2024-03-26 NOTE — Telephone Encounter (Signed)
 Per result note from AFP in February, patient due for AFP in May.  Dr. General Kenner will advise on imaging after result of next AFP.  Called and left message for patient to go to the lab the first week in May. Appointment on 5-16 at [facility]

## 2024-03-31 ENCOUNTER — Ambulatory Visit: Payer: Medicaid Other | Admitting: Podiatry

## 2024-04-04 ENCOUNTER — Encounter (HOSPITAL_COMMUNITY): Payer: Self-pay

## 2024-04-04 ENCOUNTER — Emergency Department (HOSPITAL_COMMUNITY)
Admission: EM | Admit: 2024-04-04 | Discharge: 2024-04-05 | Disposition: A | Discharge status: Home / Self Care | Attending: Emergency Medicine | Admitting: Emergency Medicine

## 2024-04-04 ENCOUNTER — Other Ambulatory Visit: Payer: Self-pay

## 2024-04-04 DIAGNOSIS — N3000 Acute cystitis without hematuria: Secondary | ICD-10-CM | POA: Diagnosis not present

## 2024-04-04 DIAGNOSIS — R112 Nausea with vomiting, unspecified: Secondary | ICD-10-CM | POA: Insufficient documentation

## 2024-04-04 DIAGNOSIS — R197 Diarrhea, unspecified: Secondary | ICD-10-CM | POA: Diagnosis not present

## 2024-04-04 DIAGNOSIS — Y902 Blood alcohol level of 40-59 mg/100 ml: Secondary | ICD-10-CM | POA: Insufficient documentation

## 2024-04-04 DIAGNOSIS — R051 Acute cough: Secondary | ICD-10-CM | POA: Diagnosis not present

## 2024-04-04 DIAGNOSIS — R5383 Other fatigue: Secondary | ICD-10-CM | POA: Insufficient documentation

## 2024-04-04 DIAGNOSIS — K760 Fatty (change of) liver, not elsewhere classified: Secondary | ICD-10-CM | POA: Diagnosis not present

## 2024-04-04 DIAGNOSIS — R109 Unspecified abdominal pain: Secondary | ICD-10-CM | POA: Diagnosis not present

## 2024-04-04 LAB — COMPREHENSIVE METABOLIC PANEL WITH GFR
ALT: 39 U/L (ref 0–44)
AST: 61 U/L — ABNORMAL HIGH (ref 15–41)
Albumin: 3.6 g/dL (ref 3.5–5.0)
Alkaline Phosphatase: 51 U/L (ref 38–126)
Anion gap: 14 (ref 5–15)
BUN: <5 mg/dL — ABNORMAL LOW (ref 6–20)
CO2: 23 mmol/L (ref 22–32)
Calcium: 10.1 mg/dL (ref 8.9–10.3)
Chloride: 90 mmol/L — ABNORMAL LOW (ref 98–111)
Creatinine, Ser: 0.6 mg/dL — ABNORMAL LOW (ref 0.61–1.24)
GFR, Estimated: >60 mL/min (ref >60)
Glucose, Bld: 94 mg/dL (ref 70–99)
Potassium: 3.6 mmol/L (ref 3.5–5.1)
Sodium: 127 mmol/L — ABNORMAL LOW (ref 135–145)
Total Bilirubin: 1 mg/dL (ref 0.0–1.2)
Total Protein: 8.5 g/dL — ABNORMAL HIGH (ref 6.5–8.1)

## 2024-04-04 LAB — CBC
HCT: 31 % — ABNORMAL LOW (ref 39.0–52.0)
Hemoglobin: 10.6 g/dL — ABNORMAL LOW (ref 13.0–17.0)
MCH: 25.9 pg — ABNORMAL LOW (ref 26.0–34.0)
MCHC: 34.2 g/dL (ref 30.0–36.0)
MCV: 75.8 fL — ABNORMAL LOW (ref 80.0–100.0)
Platelets: 136 10*3/uL — ABNORMAL LOW (ref 150–400)
RBC: 4.09 MIL/uL — ABNORMAL LOW (ref 4.22–5.81)
RDW: 17.2 % — ABNORMAL HIGH (ref 11.5–15.5)
WBC: 3.2 10*3/uL — ABNORMAL LOW (ref 4.0–10.5)
nRBC: 0 % (ref 0.0–0.2)

## 2024-04-04 LAB — MAGNESIUM: Magnesium: 1.8 mg/dL (ref 1.7–2.4)

## 2024-04-04 LAB — LIPASE, BLOOD: Lipase: 30 U/L (ref 11–51)

## 2024-04-04 NOTE — ED Provider Triage Note (Signed)
 Emergency Medicine Provider Triage Evaluation Note  Stephen Carroll , a 58 y.o. male  was evaluated in triage.  Pt complains of 2 weeks of hiccups along with diarrhea which she has 5 loose stools daily.  Patient states that this all began when his sister brought home food from work and that he cannot remember what he had.  Patient denies any nausea vomiting states has been able to eat and drink that issue.  Patient denies any fevers chest pain shortness of breath.  Patient has tried Tums to no relief.  Patient believes he has food poisoning.  Review of Systems  Positive:  Negative:   Physical Exam  BP (!) 141/93   Pulse 82   Temp 98.1 F (36.7 C) (Oral)   Resp 17   Ht 5\' 9"  (1.753 m)   Wt 67.6 kg   SpO2 100%   BMI 22.00 kg/m  Gen:   Awake, no distress   Resp:  Normal effort  MSK:   Moves extremities without difficulty  Other:  Hiccuping on exam, no abdominal tenderness or peritoneal signs  Medical Decision Making  Medically screening exam initiated at 4:21 PM.  Appropriate orders placed.  Stephen Carroll was informed that the remainder of the evaluation will be completed by another provider, this initial triage assessment does not replace that evaluation, and the importance of remaining in the ED until their evaluation is complete.  Workup initiated, patient stable at this time   Stephen Carroll 04/04/24 1630

## 2024-04-04 NOTE — ED Triage Notes (Addendum)
 Pt reports 2 weeks of cough, diarrhea, fatigue, and diarrhea. No sick contacts. No relief with OTC meds.

## 2024-04-05 ENCOUNTER — Emergency Department (HOSPITAL_COMMUNITY)

## 2024-04-05 DIAGNOSIS — R051 Acute cough: Secondary | ICD-10-CM | POA: Diagnosis not present

## 2024-04-05 DIAGNOSIS — R109 Unspecified abdominal pain: Secondary | ICD-10-CM | POA: Diagnosis not present

## 2024-04-05 DIAGNOSIS — K760 Fatty (change of) liver, not elsewhere classified: Secondary | ICD-10-CM | POA: Diagnosis not present

## 2024-04-05 LAB — URINALYSIS, ROUTINE W REFLEX MICROSCOPIC
Bilirubin Urine: NEGATIVE
Glucose, UA: NEGATIVE mg/dL
Ketones, ur: NEGATIVE mg/dL
Nitrite: POSITIVE — AB
Protein, ur: NEGATIVE mg/dL
Specific Gravity, Urine: 1.003 — ABNORMAL LOW (ref 1.005–1.030)
pH: 7 (ref 5.0–8.0)

## 2024-04-05 LAB — ETHANOL: Alcohol, Ethyl (B): 53 mg/dL — ABNORMAL HIGH (ref <15)

## 2024-04-05 LAB — RESP PANEL BY RT-PCR (RSV, FLU A&B, COVID)  RVPGX2
Influenza A by PCR: NEGATIVE
Influenza B by PCR: NEGATIVE
Resp Syncytial Virus by PCR: NEGATIVE
SARS Coronavirus 2 by RT PCR: NEGATIVE

## 2024-04-05 MED ORDER — SODIUM CHLORIDE 0.9 % IV SOLN
1.0000 g | Freq: Once | INTRAVENOUS | Status: AC
  Administered 2024-04-05: 1 g via INTRAVENOUS
  Filled 2024-04-05: qty 10

## 2024-04-05 MED ORDER — THIAMINE MONONITRATE 100 MG PO TABS: 100.0000 mg | ORAL_TABLET | Freq: Every day | ORAL | Status: DC

## 2024-04-05 MED ORDER — IOHEXOL 350 MG/ML SOLN
75.0000 mL | Freq: Once | INTRAVENOUS | Status: AC | PRN
  Administered 2024-04-05: 75 mL via INTRAVENOUS

## 2024-04-05 MED ORDER — ONDANSETRON HCL 4 MG/2ML IJ SOLN
4.0000 mg | Freq: Once | INTRAMUSCULAR | Status: AC
  Administered 2024-04-05: 4 mg via INTRAVENOUS
  Filled 2024-04-05: qty 2

## 2024-04-05 MED ORDER — SODIUM CHLORIDE 0.9 % IV BOLUS
1000.0000 mL | Freq: Once | INTRAVENOUS | Status: AC
  Administered 2024-04-05: 1000 mL via INTRAVENOUS

## 2024-04-05 MED ORDER — CEFUROXIME AXETIL 500 MG PO TABS: 500.0000 mg | ORAL_TABLET | Freq: Two times a day (BID) | ORAL | 0 refills | Status: AC

## 2024-04-05 MED ORDER — ONDANSETRON 4 MG PO TBDP: 4.0000 mg | ORAL_TABLET | Freq: Three times a day (TID) | ORAL | 0 refills | Status: DC | PRN

## 2024-04-05 NOTE — ED Notes (Signed)
 Patient transported to CT

## 2024-04-05 NOTE — Discharge Instructions (Addendum)
 You were seen in the ER today for your nausea and vomiting.  You have a urinary tract infection.  Please take the prescribed antibiotic for the entire course and follow-up with your primary care doctor or the clinic listed below.  Return to the ER with any worsening worsening abdominal pain or fever despite antibiotics, or any other new severe symptom.

## 2024-04-05 NOTE — ED Provider Notes (Signed)
 [place] EMERGENCY DEPARTMENT AT [facility] Provider Note   CSN: [id number] Arrival date & time: 04/04/24  1546     History  Chief Complaint  Patient presents with   Diarrhea    Stephen Carroll is a 58 y.o. male who presents with 2 days of cough, diarrhea, fatigue, nausea with NBNB emesis. Fatigue. No known ill contacts.  Denies melane or hematochezia, states stool is formed as normal but has had 6 BM in the last 2 days.   Hx of ETOH abuse, alcoholic cirrhosis.  CBC with hyponatremia of 127  HPI     Home Medications Prior to Admission medications   Medication Sig Start Date End Date Taking? Authorizing Provider  benzonatate  (TESSALON ) 100 MG capsule Take 1 capsule (100 mg total) by mouth 2 (two) times daily as needed for cough. 04/10/23   Santana Cue, MD  fluticasone  (FLONASE ) 50 MCG/ACT nasal spray Place 2 sprays into both nostrils daily. 04/10/23   Santana Cue, MD  folic acid  (FOLVITE ) 1 MG tablet Take 1 tablet (1 mg total) by mouth daily. 04/10/23   Santana Cue, MD  furosemide  (LASIX ) 40 MG tablet Take 1 tablet (40 mg total) by mouth daily. 01/25/24   Goble Last, MD  guaiFENesin  (MUCINEX ) 600 MG 12 hr tablet Take 1 tablet (600 mg total) by mouth 2 (two) times daily. 02/04/24   Goble Last, MD  ipratropium (ATROVENT ) 0.03 % nasal spray Place 2 sprays into both nostrils as needed for rhinitis. 10/29/23   Armbruster, Lendon Queen, MD  Multiple Vitamin (MULTIVITAMIN WITH MINERALS) TABS tablet Take 1 tablet by mouth daily. 04/10/23   Santana Cue, MD  ondansetron  (ZOFRAN -ODT) 4 MG disintegrating tablet Take 1 tablet (4 mg total) by mouth every 8 (eight) hours as needed for nausea or vomiting. 09/25/23   Armbruster, Lendon Queen, MD  propranolol  (INDERAL ) 20 MG tablet TAKE 1 TABLET BY MOUTH TWICE A DAY 10/24/23   Goble Last, MD  spironolactone  (ALDACTONE ) 100 MG tablet Take 1 tablet (100 mg total) by mouth daily. 04/10/23   Santana Cue, MD   thiamine  (VITAMIN B1) 100 MG tablet Take 1 tablet (100 mg total) by mouth daily. 04/10/23   Santana Cue, MD      Allergies    Patient has no known allergies.    Review of Systems   Review of Systems  Constitutional:  Positive for chills and fatigue. Negative for appetite change and fever.  Eyes: Negative.   Gastrointestinal:  Positive for diarrhea, nausea and vomiting. Negative for abdominal pain.  Neurological:  Negative for light-headedness and headaches.    Physical Exam Updated Vital Signs BP (!) 168/95   Pulse 79   Temp 97.7 F (36.5 C) (Oral)   Resp (!) 24   Ht 5\' 9"  (1.753 m)   Wt 67.6 kg   SpO2 98%   BMI 22.00 kg/m  Physical Exam Vitals and nursing note reviewed.  Constitutional:      Appearance: He is not ill-appearing or toxic-appearing.  HENT:     Head: Normocephalic and atraumatic.     Mouth/Throat:     Mouth: Mucous membranes are moist.     Pharynx: No oropharyngeal exudate or posterior oropharyngeal erythema.  Eyes:     General:        Right eye: No discharge.        Left eye: No discharge.     Conjunctiva/sclera: Conjunctivae normal.  Cardiovascular:     Rate  and Rhythm: Normal rate and regular rhythm.     Pulses: Normal pulses.  Pulmonary:     Effort: Pulmonary effort is normal. No respiratory distress.     Breath sounds: Normal breath sounds. No wheezing or rales.  Abdominal:     General: Bowel sounds are normal. There is no distension.     Palpations: Abdomen is soft.     Tenderness: There is no abdominal tenderness. There is no guarding or rebound.  Musculoskeletal:        General: No deformity.     Cervical back: Neck supple.  Skin:    General: Skin is warm and dry.     Capillary Refill: Capillary refill takes less than 2 seconds.  Neurological:     General: No focal deficit present.     Mental Status: He is alert and oriented to person, place, and time. Mental status is at baseline.  Psychiatric:        Mood and Affect: Mood  normal.     ED Results / Procedures / Treatments   Labs (all labs ordered are listed, but only abnormal results are displayed) Labs Reviewed  COMPREHENSIVE METABOLIC PANEL WITH GFR - Abnormal; Notable for the following components:      Result Value   Sodium 127 (*)    Chloride 90 (*)    BUN <5 (*)    Creatinine, Ser 0.60 (*)    Total Protein 8.5 (*)    AST 61 (*)    All other components within normal limits  CBC - Abnormal; Notable for the following components:   WBC 3.2 (*)    RBC 4.09 (*)    Hemoglobin 10.6 (*)    HCT 31.0 (*)    MCV 75.8 (*)    MCH 25.9 (*)    RDW 17.2 (*)    Platelets 136 (*)    All other components within normal limits  URINALYSIS, ROUTINE W REFLEX MICROSCOPIC - Abnormal; Notable for the following components:   Specific Gravity, Urine 1.003 (*)    Hgb urine dipstick SMALL (*)    Nitrite POSITIVE (*)    Leukocytes,Ua SMALL (*)    Bacteria, UA MANY (*)    All other components within normal limits  ETHANOL - Abnormal; Notable for the following components:   Alcohol, Ethyl (B) 53 (*)    All other components within normal limits  RESP PANEL BY RT-PCR (RSV, FLU A&B, COVID)  [id number]  GASTROINTESTINAL PANEL BY PCR, STOOL (REPLACES STOOL CULTURE)  LIPASE, BLOOD  MAGNESIUM     EKG None  Radiology CT ABDOMEN PELVIS W CONTRAST Result Date: 04/05/2024 CLINICAL DATA:  Acute nonlocalized abdominal pain EXAM: CT ABDOMEN AND PELVIS WITH CONTRAST TECHNIQUE: Multidetector CT imaging of the abdomen and pelvis was performed using the standard protocol following bolus administration of intravenous contrast. RADIATION DOSE REDUCTION: This exam was performed according to the departmental dose-optimization program which includes automated exposure control, adjustment of the mA and/or kV according to patient size and/or use of iterative reconstruction technique. CONTRAST:  75mL OMNIPAQUE  IOHEXOL  350 MG/ML SOLN COMPARISON:  02/13/2023 FINDINGS: Lower chest: No acute  abnormality. Hepatobiliary: Marked severe hepatic steatosis. Heterogeneous enhancement again noted, possibly reflecting changes of underlying parenchymal fibrosis. There is again seen a 18 mm x 24 mm hypoenhancing lesion within the right hepatic lobe abutting the gallbladder fossa at axial image # 31/3 stable since prior examination, indeterminate, but stable since remote prior MRI examination of 10/12/2020 where this is most in keeping with a  regenerative nodule. No enhancing intrahepatic mass identified. No intra or extrahepatic biliary ductal dilation gallbladder unremarkable. Pancreas: Unremarkable Spleen: Unremarkable Adrenals/Urinary Tract: Adrenal glands are unremarkable. Kidneys are normal, without renal calculi, focal lesion, or hydronephrosis. Bladder is unremarkable. Stomach/Bowel: Stomach is within normal limits. Appendix appears normal. No evidence of bowel wall thickening, distention, or inflammatory changes. Vascular/Lymphatic: Aortic atherosclerosis. No enlarged abdominal or pelvic lymph nodes. Reproductive: Prostate is unremarkable. Other: No abdominal wall hernia or abnormality. No abdominopelvic ascites. Musculoskeletal: L1-2 lumbar fusion with instrumentation. No acute bone abnormality. No lytic or blastic lesion. IMPRESSION: 1. No acute intra-abdominal pathology identified. 2. Marked severe hepatic steatosis. Heterogeneous enhancement suggesting changes of underlying hepatic fibrosis. 3. 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 Aortic Atherosclerosis (ICD10-I70.0). Electronically Signed   By: Worthy Heads M.D.   On: 04/05/2024 04:00   DG Chest 2 View Result Date: 04/05/2024 CLINICAL DATA:  Initial evaluation for acute cough. EXAM: CHEST - 2 VIEW COMPARISON:  Prior radiograph from 02/15/2023. FINDINGS: The cardiac and mediastinal  silhouettes are stable in size and contour, and remain within normal limits. The lungs are normally inflated. No airspace consolidation, pleural effusion, or pulmonary edema. No pneumothorax. No acute osseous abnormality. Chronic deformity about the left shoulder noted. IMPRESSION: No radiographic evidence for active cardiopulmonary disease. Electronically Signed   By: Virgia Griffins M.D.   On: 04/05/2024 03:48    Procedures Procedures    Medications Ordered in ED Medications  thiamine  (VITAMIN B1) tablet 100 mg (has no administration in time range)  cefTRIAXone  (ROCEPHIN ) 1 g in sodium chloride  0.9 % 100 mL IVPB (1 g Intravenous New Bag/Given 04/05/24 0435)  sodium chloride  0.9 % bolus 1,000 mL (0 mLs Intravenous Stopped 04/05/24 0343)  ondansetron  (ZOFRAN ) injection 4 mg (4 mg Intravenous Given 04/05/24 0206)  iohexol  (OMNIPAQUE ) 350 MG/ML injection 75 mL (75 mLs Intravenous Contrast Given 04/05/24 0330)    ED Course/ Medical Decision Making/ A&P                                 Medical Decision Making 58 y/o male who presents with concern for fatigue, diarrhea, nausea and vomiting. No urinary symptoms.   HTN on intake, VS otherwise normal. Cardioupulmonary exam  unremarkable. Abdominal exam with generalized ttp.    Nausea vomiting and fatigue may be related to urinary tract infection, viral etiology of patient's symptoms, electrolyte derangement. The differential diagnosis of diarrhea includes but is not limited to: Viral: norovirus/rotavirus; Bacterial-Campylobacter,Shigella, Salmonella, Escherichia coli, E. coli 0157:H7, Yersinia enterocolitica, Vibrio cholerae, Clostridium difficile. Parasitic- Giardia lamblia, Cryptosporidium,Entamoeba histolytica,Cyclospora, Microsporidium. Toxin- Staphylococcus aureus, Bacillus cereus.  Noninfectious causes include GI Bleed, Appendicitis, Mesenteric Ischemia, Diverticulitis, Adrenal Crisis, Thyroid  Storm, Toxicologic exposures, Antibiotic or  drug-associated, inflammatory bowel disease.   Amount and/or Complexity of Data Reviewed Labs: ordered.    Details: Pancytopenic, white count 3.2 near patient's baseline, mildly anemic hemoglobin 10.6 at patient's baseline, thrombocytopenic to 136. Hyponatremia to 127, normal creatinine, mildly elevated AST to 61 near patient's baseline.   Patient does have a urinary tract infection, alcohol mildly elevated at 53, RVP negative.  Mag is normal.  Radiology: ordered.    Details:  Chest x-ray negative for acute cardiopulmonary disease, CT abdomen pelvis without acute finding.  Risk OTC drugs. Prescription drug management.   Patient is  hemodynamically stable.  Will treat for urinary tract infection, in regard to patient's reported cough, question possible contributing viral component but no identified pulmonary concern today.  Clinical concern for emergent underlying condition that would warrant further ED workup and patient management is exceedingly low.  [place] voiced understanding of his medical evaluation and treatment plan. Each of their questions answered to their expressed satisfaction.  Return precautions were given.  Patient is well-appearing, stable, and was discharged in good condition.  This chart was dictated using voice recognition software, Dragon. Despite the best efforts of this provider to proofread and correct errors, errors may still occur which can change documentation meaning.         Final Clinical Impression(s) / ED Diagnoses Final diagnoses:  None    Rx / DC Orders ED Discharge Orders     None         Arlyne Lame 04/05/24 1610    Lindle Rhea, MD 04/05/24 [phone]

## 2024-04-06 ENCOUNTER — Other Ambulatory Visit: Payer: Self-pay | Admitting: Gastroenterology

## 2024-04-13 ENCOUNTER — Other Ambulatory Visit: Payer: Self-pay | Admitting: Family Medicine

## 2024-04-13 DIAGNOSIS — K7030 Alcoholic cirrhosis of liver without ascites: Secondary | ICD-10-CM

## 2024-04-14 ENCOUNTER — Other Ambulatory Visit

## 2024-04-14 DIAGNOSIS — R772 Abnormality of alphafetoprotein: Secondary | ICD-10-CM

## 2024-04-14 DIAGNOSIS — K7030 Alcoholic cirrhosis of liver without ascites: Secondary | ICD-10-CM

## 2024-04-15 LAB — AFP TUMOR MARKER: AFP-Tumor Marker: 11.6 ng/mL — ABNORMAL HIGH (ref <6.1)

## 2024-04-24 ENCOUNTER — Other Ambulatory Visit: Payer: Self-pay | Admitting: Student

## 2024-04-24 DIAGNOSIS — K7030 Alcoholic cirrhosis of liver without ascites: Secondary | ICD-10-CM

## 2024-04-25 ENCOUNTER — Other Ambulatory Visit (INDEPENDENT_AMBULATORY_CARE_PROVIDER_SITE_OTHER)

## 2024-04-25 ENCOUNTER — Encounter: Payer: Self-pay | Admitting: Gastroenterology

## 2024-04-25 ENCOUNTER — Ambulatory Visit: Payer: Medicaid Other | Admitting: Gastroenterology

## 2024-04-25 VITALS — BP 160/80 | HR 101 | Ht 68.0 in | Wt 142.0 lb

## 2024-04-25 DIAGNOSIS — F1090 Alcohol use, unspecified, uncomplicated: Secondary | ICD-10-CM

## 2024-04-25 DIAGNOSIS — K7030 Alcoholic cirrhosis of liver without ascites: Secondary | ICD-10-CM

## 2024-04-25 DIAGNOSIS — D649 Anemia, unspecified: Secondary | ICD-10-CM

## 2024-04-25 LAB — CBC WITH DIFFERENTIAL/PLATELET
Basophils Absolute: 0 10*3/uL (ref 0.0–0.1)
Basophils Relative: 1.4 % (ref 0.0–3.0)
Eosinophils Absolute: 0 10*3/uL (ref 0.0–0.7)
Eosinophils Relative: 1.7 % (ref 0.0–5.0)
HCT: 34.3 % — ABNORMAL LOW (ref 39.0–52.0)
Hemoglobin: 11.4 g/dL — ABNORMAL LOW (ref 13.0–17.0)
Lymphocytes Relative: 43.9 % (ref 12.0–46.0)
Lymphs Abs: 1.2 10*3/uL (ref 0.7–4.0)
MCHC: 33.1 g/dL (ref 30.0–36.0)
MCV: 79 fl (ref 78.0–100.0)
Monocytes Absolute: 0.3 10*3/uL (ref 0.1–1.0)
Monocytes Relative: 12.3 % — ABNORMAL HIGH (ref 3.0–12.0)
Neutro Abs: 1.1 10*3/uL — ABNORMAL LOW (ref 1.4–7.7)
Neutrophils Relative %: 40.7 % — ABNORMAL LOW (ref 43.0–77.0)
Platelets: 138 10*3/uL — ABNORMAL LOW (ref 150.0–400.0)
RBC: 4.35 Mil/uL (ref 4.22–5.81)
RDW: 17.5 % — ABNORMAL HIGH (ref 11.5–15.5)
WBC: 2.7 10*3/uL — ABNORMAL LOW (ref 4.0–10.5)
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
## 2024-04-25 NOTE — Patient Instructions (Addendum)
 Please go to the lab in the basement of our building to have lab work done as you leave today. Hit "B" for basement when you get on the elevator.  When the doors open the lab is on your left.  We will call you with the results. Thank you.  You will be due for RUQ U/S and labs in October.  Thank you for entrusting me with your care and for choosing [facility], Dr. Alvester Johnson    If your blood pressure at your visit was 140/90 or greater, please contact your primary care physician to follow up on this. ______________________________________________________  If you are age 49 or older, your body mass index should be between 23-30. Your Body mass index is 21.59 kg/m. If this is out of the aforementioned range listed, please consider follow up with your Primary Care Provider.  If you are age 70 or younger, your body mass index should be between 19-25. Your Body mass index is 21.59 kg/m. If this is out of the aformentioned range listed, please consider follow up with your Primary Care Provider.  ________________________________________________________  The [place] [facility] providers would like to encourage you to use MYCHART to communicate with providers for non-urgent requests or questions.  Due to long hold times on the telephone, sending your provider a message by [facility] may be a faster and more efficient way to get a response.  Please allow 48 business hours for a response.  Please remember that this is for non-urgent requests.  _______________________________________________________  Due to recent changes in healthcare laws, you may see the results of your imaging and laboratory studies on MyChart before your provider has had a chance to review them.  We understand that in some cases there may be results that are confusing or concerning to you. Not all laboratory results come back in the same time frame and the provider may be waiting for multiple results in order to interpret others.   Please give us  48 hours in order for your provider to thoroughly review all the results before contacting the office for clarification of your results.

## 2024-04-28 ENCOUNTER — Ambulatory Visit: Payer: Medicaid Other | Admitting: Gastroenterology

## 2024-04-29 ENCOUNTER — Ambulatory Visit: Payer: Self-pay | Admitting: Gastroenterology

## 2024-04-29 LAB — IBC + FERRITIN
Ferritin: 37.3 ng/mL (ref 22.0–322.0)
Iron: 96 ug/dL (ref 42–165)
Saturation Ratios: 18.2 % — ABNORMAL LOW (ref 20.0–50.0)
TIBC: 527.8 ug/dL — ABNORMAL HIGH (ref 250.0–450.0)
Transferrin: 377 mg/dL — ABNORMAL HIGH (ref 212.0–360.0)

## 2024-05-22 ENCOUNTER — Emergency Department (HOSPITAL_COMMUNITY)
Admission: EM | Admit: 2024-05-22 | Discharge: 2024-05-22 | Disposition: A | Discharge status: Home / Self Care | Attending: Emergency Medicine | Admitting: Emergency Medicine

## 2024-05-22 ENCOUNTER — Encounter (HOSPITAL_COMMUNITY): Payer: Self-pay | Admitting: Emergency Medicine

## 2024-05-22 ENCOUNTER — Emergency Department (HOSPITAL_COMMUNITY)

## 2024-05-22 ENCOUNTER — Other Ambulatory Visit: Payer: Self-pay

## 2024-05-22 DIAGNOSIS — R079 Chest pain, unspecified: Secondary | ICD-10-CM | POA: Insufficient documentation

## 2024-05-22 DIAGNOSIS — R066 Hiccough: Secondary | ICD-10-CM | POA: Insufficient documentation

## 2024-05-22 DIAGNOSIS — R112 Nausea with vomiting, unspecified: Secondary | ICD-10-CM | POA: Insufficient documentation

## 2024-05-22 DIAGNOSIS — Y907 Blood alcohol level of 200-239 mg/100 ml: Secondary | ICD-10-CM | POA: Insufficient documentation

## 2024-05-22 DIAGNOSIS — R0989 Other specified symptoms and signs involving the circulatory and respiratory systems: Secondary | ICD-10-CM | POA: Insufficient documentation

## 2024-05-22 DIAGNOSIS — R1111 Vomiting without nausea: Secondary | ICD-10-CM | POA: Diagnosis not present

## 2024-05-22 DIAGNOSIS — E876 Hypokalemia: Secondary | ICD-10-CM | POA: Insufficient documentation

## 2024-05-22 DIAGNOSIS — I10 Essential (primary) hypertension: Secondary | ICD-10-CM | POA: Diagnosis not present

## 2024-05-22 DIAGNOSIS — F1010 Alcohol abuse, uncomplicated: Secondary | ICD-10-CM | POA: Insufficient documentation

## 2024-05-22 DIAGNOSIS — R197 Diarrhea, unspecified: Secondary | ICD-10-CM | POA: Diagnosis not present

## 2024-05-22 DIAGNOSIS — E860 Dehydration: Secondary | ICD-10-CM | POA: Insufficient documentation

## 2024-05-22 DIAGNOSIS — I213 ST elevation (STEMI) myocardial infarction of unspecified site: Secondary | ICD-10-CM | POA: Diagnosis not present

## 2024-05-22 LAB — URINALYSIS, ROUTINE W REFLEX MICROSCOPIC
Bacteria, UA: NONE SEEN
Bilirubin Urine: NEGATIVE
Glucose, UA: NEGATIVE mg/dL
Ketones, ur: NEGATIVE mg/dL
Leukocytes,Ua: NEGATIVE
Nitrite: NEGATIVE
Protein, ur: NEGATIVE mg/dL
Specific Gravity, Urine: 1.006 (ref 1.005–1.030)
pH: 6 (ref 5.0–8.0)

## 2024-05-22 LAB — COMPREHENSIVE METABOLIC PANEL WITH GFR
ALT: 33 U/L (ref 0–44)
AST: 61 U/L — ABNORMAL HIGH (ref 15–41)
Albumin: 3.8 g/dL (ref 3.5–5.0)
Alkaline Phosphatase: 58 U/L (ref 38–126)
Anion gap: 16 — ABNORMAL HIGH (ref 5–15)
BUN: <5 mg/dL — ABNORMAL LOW (ref 6–20)
CO2: 27 mmol/L (ref 22–32)
Calcium: 9.3 mg/dL (ref 8.9–10.3)
Chloride: 83 mmol/L — ABNORMAL LOW (ref 98–111)
Creatinine, Ser: 0.59 mg/dL — ABNORMAL LOW (ref 0.61–1.24)
GFR, Estimated: >60 mL/min (ref >60)
Glucose, Bld: 113 mg/dL — ABNORMAL HIGH (ref 70–99)
Potassium: 3 mmol/L — ABNORMAL LOW (ref 3.5–5.1)
Sodium: 126 mmol/L — ABNORMAL LOW (ref 135–145)
Total Bilirubin: 0.6 mg/dL (ref 0.0–1.2)
Total Protein: 8.9 g/dL — ABNORMAL HIGH (ref 6.5–8.1)

## 2024-05-22 LAB — CBC
HCT: 32.5 % — ABNORMAL LOW (ref 39.0–52.0)
Hemoglobin: 11.2 g/dL — ABNORMAL LOW (ref 13.0–17.0)
MCH: 26 pg (ref 26.0–34.0)
MCHC: 34.5 g/dL (ref 30.0–36.0)
MCV: 75.4 fL — ABNORMAL LOW (ref 80.0–100.0)
Platelets: 160 10*3/uL (ref 150–400)
RBC: 4.31 MIL/uL (ref 4.22–5.81)
RDW: 16.4 % — ABNORMAL HIGH (ref 11.5–15.5)
WBC: 2.8 10*3/uL — ABNORMAL LOW (ref 4.0–10.5)
nRBC: 0 % (ref 0.0–0.2)

## 2024-05-22 LAB — TROPONIN I (HIGH SENSITIVITY)
Troponin I (High Sensitivity): 8 ng/L (ref <18)
Troponin I (High Sensitivity): 7 ng/L (ref <18)

## 2024-05-22 LAB — ETHANOL: Alcohol, Ethyl (B): 236 mg/dL — ABNORMAL HIGH (ref <15)

## 2024-05-22 LAB — LIPASE, BLOOD: Lipase: 28 U/L (ref 11–51)

## 2024-05-22 MED ORDER — PANTOPRAZOLE SODIUM 20 MG PO TBEC: 20.0000 mg | DELAYED_RELEASE_TABLET | Freq: Every day | ORAL | 0 refills | Status: DC

## 2024-05-22 MED ORDER — PANTOPRAZOLE SODIUM 40 MG IV SOLR
40.0000 mg | Freq: Once | INTRAVENOUS | Status: AC
  Administered 2024-05-22: 40 mg via INTRAVENOUS
  Filled 2024-05-22: qty 10

## 2024-05-22 MED ORDER — ONDANSETRON HCL 4 MG/2ML IJ SOLN
4.0000 mg | Freq: Once | INTRAMUSCULAR | Status: AC
  Administered 2024-05-22: 4 mg via INTRAVENOUS
  Filled 2024-05-22: qty 2

## 2024-05-22 MED ORDER — SUCRALFATE 1 G PO TABS: 1.0000 g | ORAL_TABLET | Freq: Three times a day (TID) | ORAL | 0 refills | Status: DC

## 2024-05-22 MED ORDER — ONDANSETRON 4 MG PO TBDP: 4.0000 mg | ORAL_TABLET | Freq: Three times a day (TID) | ORAL | 0 refills | Status: DC | PRN

## 2024-05-22 MED ORDER — POTASSIUM CHLORIDE CRYS ER 20 MEQ PO TBCR: 20.0000 meq | EXTENDED_RELEASE_TABLET | Freq: Two times a day (BID) | ORAL | 0 refills | Status: DC

## 2024-05-22 MED ORDER — SODIUM CHLORIDE 0.9 % IV BOLUS
1000.0000 mL | Freq: Once | INTRAVENOUS | Status: AC
  Administered 2024-05-22: 1000 mL via INTRAVENOUS

## 2024-05-22 NOTE — ED Provider Triage Note (Signed)
 Emergency Medicine Provider Triage Evaluation Note  Stephen Carroll , a 58 y.o. male  was evaluated in triage.  Pt complains of hiccups. Endorse persistent hiccups x 3 days. Also report n/v/d for same.  Hx of tobacco and alcohol use, last alcohol use last night.  Denies abd pain, or cp, no sob.  No fever, chills  Review of Systems  Positive: As above Negative: As above  Physical Exam  BP (!) 159/94   Pulse 84   Temp 98.3 F (36.8 C)   Resp 17   Ht 5' 8 (1.727 m)   Wt 64.4 kg   SpO2 100%   BMI 21.59 kg/m  Gen:   Awake, no distress   Resp:  Normal effort  MSK:   Moves extremities without difficulty  Other:    Medical Decision Making  Medically screening exam initiated at 3:45 PM.  Appropriate orders placed.  Stephen Carroll was informed that the remainder of the evaluation will be completed by another provider, this initial triage assessment does not replace that evaluation, and the importance of remaining in the ED until their evaluation is complete.     Debbra Fairy, PA-C 05/22/24 [id number]

## 2024-05-22 NOTE — ED Notes (Signed)
 Changed patient to high fall risk due to labs, shaky movements, and inability to remain awake while this RN was at the bedside. Bracelet paced on patient, fall pad placed, and patient educated on need to use call bell to get out of bed. Patient verbalized understanding and went back to sleep.

## 2024-05-22 NOTE — ED Notes (Signed)
 Pt discharged, pt given discharge papers and papers explained. Pt's sister picking him up

## 2024-05-22 NOTE — ED Notes (Signed)
 Patient's sister Angie to leave at this time. Clothing left for patient at his bedside. Sister to take his cell phone home with her.

## 2024-05-22 NOTE — ED Notes (Signed)
ED PA at BS 

## 2024-05-22 NOTE — ED Triage Notes (Signed)
 Per [facility] pt coming from home - c/o hiccups and emesis over past few days. C/o pain with hiccups. Reports 3-4 beers daily.

## 2024-05-22 NOTE — Discharge Instructions (Addendum)
 Take Zofran  every 8 hours for nausea. Recommend Imodium , found over-the-counter, for diarrhea. A list of food choices to help diarrhea are also provided. Take Protonix  and Carafate to help with indigestion and hopefully prevent recurrent hiccups.   Follow up with your doctor for recheck in one week.

## 2024-05-22 NOTE — ED Provider Notes (Signed)
 [place] EMERGENCY DEPARTMENT AT [facility] Provider Note   CSN: [id number] Arrival date & time: 05/22/24  1519     Patient presents with: Emesis and Hiccups   Stephen Carroll is a 58 y.o. male.   Patient to the ED with 4-day history of nausea, vomiting, chest pain and cough. No fever. He has also had the hiccups consistently for the 4 days, which is when his chest hurts. No history of heart disease. His sister is at bedside who is his caregiver. She reports history of alcoholism and liver disease. No diarrhea. No hematemesis.   The history is provided by the patient. No language interpreter was used.  Emesis      Prior to Admission medications   Medication Sig Start Date End Date Taking? Authorizing Provider  pantoprazole  (PROTONIX ) 20 MG tablet Take 1 tablet (20 mg total) by mouth daily. 05/22/24  Yes Greenlee Ancheta, Clovis Dar, PA-C  potassium chloride  SA (KLOR-CON  M) 20 MEQ tablet Take 1 tablet (20 mEq total) by mouth 2 (two) times daily. 05/22/24  Yes Jaquan Sadowsky, Clovis Dar, PA-C  sucralfate  (CARAFATE ) 1 g tablet Take 1 tablet (1 g total) by mouth 4 (four) times daily -  with meals and at bedtime. 05/22/24  Yes Telissa Palmisano, Clovis Dar, PA-C  folic acid  (FOLVITE ) 1 MG tablet Take 1 tablet (1 mg total) by mouth daily. 04/10/23   Santana Cue, MD  furosemide  (LASIX ) 40 MG tablet TAKE 1 TABLET BY MOUTH EVERY DAY 04/25/24   Goble Last, MD  guaiFENesin  (MUCINEX ) 600 MG 12 hr tablet Take 1 tablet (600 mg total) by mouth 2 (two) times daily. 02/04/24   Goble Last, MD  ipratropium (ATROVENT ) 0.03 % nasal spray Place 2 sprays into both nostrils as needed for rhinitis. 10/29/23   Armbruster, Lendon Queen, MD  Multiple Vitamin (MULTIVITAMIN WITH MINERALS) TABS tablet Take 1 tablet by mouth daily. 04/10/23   Santana Cue, MD  ondansetron  (ZOFRAN -ODT) 4 MG disintegrating tablet Take 1 tablet (4 mg total) by mouth every 8 (eight) hours as needed for nausea or vomiting. 05/22/24   Mandy Second, PA-C   propranolol  (INDERAL ) 20 MG tablet TAKE 1 TABLET BY MOUTH TWICE A DAY 10/24/23   Goble Last, MD  spironolactone  (ALDACTONE ) 100 MG tablet TAKE 1 TABLET BY MOUTH EVERY DAY 04/14/24   Goble Last, MD  thiamine  (VITAMIN B1) 100 MG tablet Take 1 tablet (100 mg total) by mouth daily. 04/10/23   Santana Cue, MD    Allergies: Patient has no known allergies.    Review of Systems  Gastrointestinal:  Positive for vomiting.    Updated Vital Signs BP (!) 172/100 (BP Location: Right Arm)   Pulse 94   Temp 97.6 F (36.4 C) (Oral)   Resp (!) 21   Ht 5' 8 (1.727 m)   Wt 64.4 kg   SpO2 96%   BMI 21.59 kg/m   Physical Exam Vitals and nursing note reviewed.  Constitutional:      Appearance: Normal appearance. He is well-developed.  HENT:     Head: Normocephalic.   Cardiovascular:     Rate and Rhythm: Normal rate and regular rhythm.     Heart sounds: No murmur heard. Pulmonary:     Effort: Pulmonary effort is normal.     Breath sounds: Rhonchi (scattered) present. No wheezing or rales.  Abdominal:     General: Bowel sounds are normal.     Palpations: Abdomen is soft.     Tenderness: There is no  abdominal tenderness. There is no guarding or rebound.   Musculoskeletal:        General: Normal range of motion.     Cervical back: Normal range of motion and neck supple.   Skin:    General: Skin is warm and dry.   Neurological:     General: No focal deficit present.     Mental Status: He is alert and oriented to person, place, and time.     (all labs ordered are listed, but only abnormal results are displayed) Labs Reviewed  COMPREHENSIVE METABOLIC PANEL WITH GFR - Abnormal; Notable for the following components:      Result Value   Sodium 126 (*)    Potassium 3.0 (*)    Chloride 83 (*)    Glucose, Bld 113 (*)    BUN <5 (*)    Creatinine, Ser 0.59 (*)    Total Protein 8.9 (*)    AST 61 (*)    Anion gap 16 (*)    All other components within normal limits  CBC -  Abnormal; Notable for the following components:   WBC 2.8 (*)    Hemoglobin 11.2 (*)    HCT 32.5 (*)    MCV 75.4 (*)    RDW 16.4 (*)    All other components within normal limits  URINALYSIS, ROUTINE W REFLEX MICROSCOPIC - Abnormal; Notable for the following components:   Hgb urine dipstick SMALL (*)    All other components within normal limits  ETHANOL - Abnormal; Notable for the following components:   Alcohol, Ethyl (B) 236 (*)    All other components within normal limits  LIPASE, BLOOD  TROPONIN I (HIGH SENSITIVITY)  TROPONIN I (HIGH SENSITIVITY)   Results for orders placed or performed during the hospital encounter of 05/22/24  Lipase, blood   Collection Time: 05/22/24  3:30 PM  Result Value Ref Range   Lipase 28 11 - 51 U/L  Comprehensive metabolic panel   Collection Time: 05/22/24  3:30 PM  Result Value Ref Range   Sodium 126 (L) 135 - 145 mmol/L   Potassium 3.0 (L) 3.5 - 5.1 mmol/L   Chloride 83 (L) 98 - 111 mmol/L   CO2 27 22 - 32 mmol/L   Glucose, Bld 113 (H) 70 - 99 mg/dL   BUN <5 (L) 6 - 20 mg/dL   Creatinine, Ser 6.57 (L) 0.61 - 1.24 mg/dL   Calcium  9.3 8.9 - 10.3 mg/dL   Total Protein 8.9 (H) 6.5 - 8.1 g/dL   Albumin 3.8 3.5 - 5.0 g/dL   AST 61 (H) 15 - 41 U/L   ALT 33 0 - 44 U/L   Alkaline Phosphatase 58 38 - 126 U/L   Total Bilirubin 0.6 0.0 - 1.2 mg/dL   GFR, Estimated >84 >69 mL/min   Anion gap 16 (H) 5 - 15  CBC   Collection Time: 05/22/24  3:30 PM  Result Value Ref Range   WBC 2.8 (L) 4.0 - 10.5 K/uL   RBC 4.31 4.22 - 5.81 MIL/uL   Hemoglobin 11.2 (L) 13.0 - 17.0 g/dL   HCT 62.9 (L) 52.8 - 41.3 %   MCV 75.4 (L) 80.0 - 100.0 fL   MCH 26.0 26.0 - 34.0 pg   MCHC 34.5 30.0 - 36.0 g/dL   RDW 24.4 (H) 01.0 - 27.2 %   Platelets 160 150 - 400 K/uL   nRBC 0.0 0.0 - 0.2 %  Urinalysis, Routine w reflex microscopic -Urine, Clean Catch  Collection Time: 05/22/24  3:30 PM  Result Value Ref Range   Color, Urine YELLOW YELLOW   APPearance CLEAR CLEAR    Specific Gravity, Urine 1.006 1.005 - 1.030   pH 6.0 5.0 - 8.0   Glucose, UA NEGATIVE NEGATIVE mg/dL   Hgb urine dipstick SMALL (A) NEGATIVE   Bilirubin Urine NEGATIVE NEGATIVE   Ketones, ur NEGATIVE NEGATIVE mg/dL   Protein, ur NEGATIVE NEGATIVE mg/dL   Nitrite NEGATIVE NEGATIVE   Leukocytes,Ua NEGATIVE NEGATIVE   RBC / HPF 0-5 0 - 5 RBC/hpf   WBC, UA 0-5 0 - 5 WBC/hpf   Bacteria, UA NONE SEEN NONE SEEN   Squamous Epithelial / HPF 0-5 0 - 5 /HPF   Mucus PRESENT   Ethanol   Collection Time: 05/22/24  4:00 PM  Result Value Ref Range   Alcohol, Ethyl (B) 236 (H) <15 mg/dL  Troponin I (High Sensitivity)   Collection Time: 05/22/24  6:18 PM  Result Value Ref Range   Troponin I (High Sensitivity) 8 <18 ng/L  Troponin I (High Sensitivity)   Collection Time: 05/22/24  8:29 PM  Result Value Ref Range   Troponin I (High Sensitivity) 7 <18 ng/L    EKG: EKG Interpretation Date/Time:  Thursday May 22 2024 18:34:46 EDT Ventricular Rate:  77 PR Interval:  175 QRS Duration:  112 QT Interval:  428 QTC Calculation: 485 R Axis:   30  Text Interpretation: Sinus rhythm Atrial premature complex Incomplete right bundle branch block Nonspecific T abnormalities, lateral leads Borderline ST elevation, anterior leads Borderline prolonged QT interval Confirmed by Afton Horse [phone]) on 05/22/2024 9:49:18 PM  Radiology: Lenell Query Chest 2 View Result Date: 05/22/2024 CLINICAL DATA:  Hiccups EXAM: CHEST - 2 VIEW COMPARISON:  April twenty six two FINDINGS: The heart size and mediastinal contours are within normal limits. Both lungs are clear. The visualized skeletal structures are unremarkable. 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 Electronically Signed   By: Fredrich Jefferson M.D.   On: 05/22/2024 16:17     Procedures   Medications Ordered in the ED  sodium chloride  0.9 % bolus 1,000 mL (0 mLs Intravenous Stopped 05/22/24  1950)  ondansetron  (ZOFRAN ) injection 4 mg (4 mg Intravenous Given 05/22/24 1819)  pantoprazole  (PROTONIX ) injection 40 mg (40 mg Intravenous Given 05/22/24 1835)                                    Medical Decision Making This patient presents to the ED for concern of chest pain, this involves an extensive number of treatment options, and is a complaint that carries with it a high risk of complications and morbidity.  The differential diagnosis includes ACS, PNA, PE, PTX, dissection, GERD   Co morbidities that complicate the patient evaluation  Alcoholism, cirrhosis, arthritis, cerebral aneurysm   Additional history obtained:  Additional history and/or information obtained from chart review, notable for Sister at bedside reports   Lab Tests:  I Ordered, and personally interpreted labs.  The pertinent results include:   Troponin 8 ---> 7 ETOH 236 UA - clear CBC: WBC 2.8, hgb 11.2, plts normal Cmet: Na 126, K+ 3.0, Cl 83    Imaging Studies ordered:  I ordered imaging studies including CXR I independently visualized and interpreted imaging which showed NAD I agree with the radiologist interpretation   Cardiac Monitoring:  The patient was maintained  on a cardiac monitor.  I personally viewed and interpreted the cardiac monitored which showed an underlying rhythm of:  EKG Interpretation Date/Time:  Thursday May 22 2024 18:34:46 EDT Ventricular Rate:  77 PR Interval:  175 QRS Duration:  112 QT Interval:  428 QTC Calculation: 485 R Axis:   30  Text Interpretation: Sinus rhythm Atrial premature complex Incomplete right bundle branch block Nonspecific T abnormalities, lateral leads Borderline ST elevation, anterior leads Borderline prolonged QT interval Confirmed by Afton Horse [phone]) on 05/22/2024 9:49:18 PM   Medicines ordered and prescription drug management:  I ordered medication including Zofran , Prontonix  for Nausea, hiccups Reevaluation of the patient after  these medicines showed that the patient resolved I have reviewed the patients home medicines and have made adjustments as needed   Test Considered:  N/a   Critical Interventions:  N/a   Consultations Obtained:  I requested consultation with the n/a,  and discussed lab and imaging findings as well as pertinent plan - they recommend: n/a   Problem List / ED Course:  Here with sister who reports N, V, D, hiccups and chest pain all for 4 days. He is not eating well. He continues to drink heavily He is awake, alert. Active hiccups.  Exam essentially unremarkable, no abdominal tenderness, lungs essentially clear Labs: hyponatremia, hypokalemia - his of similar values. He is hydrated in the ED. Potassium supplemented.  No vomiting. He is eating and drinking. He ambulates to the bathroom and is steady.  Rx's provided: potassium, zofran , Imodium  (recommended), prontonix, carafate  Sister, Milda Aline, updated by phone. He is stable for discharge. Angie to pick him up.   Reevaluation:  After the interventions noted above, I reevaluated the patient and found that they have :resolved   Social Determinants of Health:  Daily drinker   Disposition:  After consideration of the diagnostic results and the patients response to treatment, I feel that the patient would benefit from discharge home.   Amount and/or Complexity of Data Reviewed Labs: ordered.  Risk Prescription drug management.        Final diagnoses:  Dehydration  Nausea and vomiting, unspecified vomiting type  Hiccoughs  Diarrhea, unspecified type  Hypokalemia    ED Discharge Orders          Ordered    ondansetron  (ZOFRAN -ODT) 4 MG disintegrating tablet  Every 8 hours PRN        05/22/24 2142    sucralfate  (CARAFATE ) 1 g tablet  3 times daily with meals & bedtime        05/22/24 2142    pantoprazole  (PROTONIX ) 20 MG tablet  Daily        05/22/24 2142    potassium chloride  SA (KLOR-CON  M) 20 MEQ tablet  2  times daily        05/22/24 2149               Mandy Second, PA-C 05/22/24 2157    Afton Horse T, DO 05/28/24 1502

## 2024-05-22 NOTE — ED Notes (Signed)
 Pt said that he was only able to eat half of his sandwich. But was able to ambulate to bathroom with no assistant

## 2024-06-16 ENCOUNTER — Ambulatory Visit: Admitting: Podiatry

## 2024-06-16 DIAGNOSIS — M79674 Pain in right toe(s): Secondary | ICD-10-CM | POA: Diagnosis not present

## 2024-06-16 DIAGNOSIS — M79675 Pain in left toe(s): Secondary | ICD-10-CM

## 2024-06-16 DIAGNOSIS — B351 Tinea unguium: Secondary | ICD-10-CM

## 2024-06-16 DIAGNOSIS — D492 Neoplasm of unspecified behavior of bone, soft tissue, and skin: Secondary | ICD-10-CM

## 2024-06-16 DIAGNOSIS — Z89422 Acquired absence of other left toe(s): Secondary | ICD-10-CM

## 2024-06-16 NOTE — Progress Notes (Unsigned)
    Subjective:  Patient ID: Stephen Carroll, male    DOB: September 16, 1966,  MRN: [id number]  Stephen Carroll presents to clinic today for:  Chief Complaint  Patient presents with   RFC    Denies diabetes. Has callus on balls of feet. Patient declines door being closed because he has been to prison and is schizophrenic.    Informed by the nurse today that she felt very uncomfortable with this patient stating he was very aggressive, cursing constantly and occasionally raising his voice, and insisted the door be left open because he has been in prison and is schizophrenic and does not want to be closed in.  He has been seen before with the doors closed in this office.  The clinic manager was requested to be present by the staff during his appointment today.  Patient notes nails are thick and elongated, causing pain in shoe gear when ambulating.  Also notes painful skin lesions bilateral.  PCP is Rosendo Rush, MD.  Past Medical History:  Diagnosis Date   Alcohol use 1985   Alcohol withdrawal seizure (HCC)    last seizure years ago ,not sure of date   Alcoholic cirrhosis of liver with ascites (HCC) 2017   Aneurysm, cerebral, nonruptured 08/09/2015   Arthritis    Hepatic steatosis 03/2024   CT   Migraine 1984   Toe amputee (HCC)    No Known Allergies  Objective:  Stephen Carroll is a pleasant 58 y.o. male in NAD. AAO x 3.  Vascular Examination: Patient has palpable DP pulse, absent PT pulse bilateral.  Delayed capillary refill bilateral toes.  Sparse digital hair bilateral.  Proximal to distal cooling WNL bilateral.    Dermatological Examination: Interspaces are clear with no open lesions noted bilateral.  Skin is shiny and atrophic bilateral.  Nails are 3-65mm thick, with yellowish/brown discoloration, subungual debris and distal onycholysis x10.  There is pain with compression of nails x10.  There are hyperkeratotic lesions noted bilateral plantar medial hallux IPJ, distal lateral right  fifth toe, bilateral posterior lateral heels..  Assessment/Plan: 1. Pain due to onychomycosis of toenails of both feet   2. Skin neoplasm   3. Status post amputation of lesser toe of left foot (HCC)    Mycotic nails x10 were sharply debrided with sterile nail nippers and power debriding burr to decrease bulk and length.  Skin neoplasms bilateral plantar foot were shaved with #312 blade and sanded with the umbrella bur.  Return in about 3 months (around 09/16/2024) for RFC.  It was requested that he be rescheduled in the future with male providers.   Stephen Carroll, DPM, FACFAS [facility]     [address] [place], [place] [address]                Office [phone]  Fax [phone]

## 2024-06-20 ENCOUNTER — Other Ambulatory Visit: Payer: Self-pay

## 2024-06-20 ENCOUNTER — Telehealth: Payer: Self-pay | Admitting: Gastroenterology

## 2024-06-20 ENCOUNTER — Emergency Department (HOSPITAL_COMMUNITY)
Admission: EM | Admit: 2024-06-20 | Discharge: 2024-06-20 | Disposition: A | Discharge status: Home / Self Care | Attending: Emergency Medicine | Admitting: Emergency Medicine

## 2024-06-20 ENCOUNTER — Encounter (HOSPITAL_COMMUNITY): Payer: Self-pay

## 2024-06-20 DIAGNOSIS — K625 Hemorrhage of anus and rectum: Secondary | ICD-10-CM | POA: Insufficient documentation

## 2024-06-20 DIAGNOSIS — D72819 Decreased white blood cell count, unspecified: Secondary | ICD-10-CM | POA: Insufficient documentation

## 2024-06-20 DIAGNOSIS — R9431 Abnormal electrocardiogram [ECG] [EKG]: Secondary | ICD-10-CM | POA: Diagnosis not present

## 2024-06-20 DIAGNOSIS — R197 Diarrhea, unspecified: Secondary | ICD-10-CM | POA: Diagnosis not present

## 2024-06-20 DIAGNOSIS — I10 Essential (primary) hypertension: Secondary | ICD-10-CM | POA: Diagnosis not present

## 2024-06-20 LAB — COMPREHENSIVE METABOLIC PANEL WITH GFR
ALT: 38 U/L (ref 0–44)
AST: 73 U/L — ABNORMAL HIGH (ref 15–41)
Albumin: 3.6 g/dL (ref 3.5–5.0)
Alkaline Phosphatase: 46 U/L (ref 38–126)
Anion gap: 15 (ref 5–15)
BUN: <5 mg/dL — ABNORMAL LOW (ref 6–20)
CO2: 22 mmol/L (ref 22–32)
Calcium: 9 mg/dL (ref 8.9–10.3)
Chloride: 94 mmol/L — ABNORMAL LOW (ref 98–111)
Creatinine, Ser: 0.67 mg/dL (ref 0.61–1.24)
GFR, Estimated: >60 mL/min (ref >60)
Glucose, Bld: 97 mg/dL (ref 70–99)
Potassium: 3.5 mmol/L (ref 3.5–5.1)
Sodium: 131 mmol/L — ABNORMAL LOW (ref 135–145)
Total Bilirubin: 1.1 mg/dL (ref 0.0–1.2)
Total Protein: 8.4 g/dL — ABNORMAL HIGH (ref 6.5–8.1)

## 2024-06-20 LAB — CBC WITH DIFFERENTIAL/PLATELET
Abs Immature Granulocytes: 0.01 K/uL (ref 0.00–0.07)
Basophils Absolute: 0 K/uL (ref 0.0–0.1)
Basophils Relative: 1 %
Eosinophils Absolute: 0 K/uL (ref 0.0–0.5)
Eosinophils Relative: 0 %
HCT: 33 % — ABNORMAL LOW (ref 39.0–52.0)
Hemoglobin: 11.2 g/dL — ABNORMAL LOW (ref 13.0–17.0)
Immature Granulocytes: 0 %
Lymphocytes Relative: 27 %
Lymphs Abs: 0.7 K/uL (ref 0.7–4.0)
MCH: 26.7 pg (ref 26.0–34.0)
MCHC: 33.9 g/dL (ref 30.0–36.0)
MCV: 78.6 fL — ABNORMAL LOW (ref 80.0–100.0)
Monocytes Absolute: 0.4 K/uL (ref 0.1–1.0)
Monocytes Relative: 14 %
Neutro Abs: 1.5 K/uL — ABNORMAL LOW (ref 1.7–7.7)
Neutrophils Relative %: 58 %
Platelets: 112 K/uL — ABNORMAL LOW (ref 150–400)
RBC: 4.2 MIL/uL — ABNORMAL LOW (ref 4.22–5.81)
RDW: 15.8 % — ABNORMAL HIGH (ref 11.5–15.5)
WBC: 2.7 K/uL — ABNORMAL LOW (ref 4.0–10.5)
nRBC: 0 % (ref 0.0–0.2)

## 2024-06-20 LAB — POC OCCULT BLOOD, ED: Fecal Occult Bld: POSITIVE — AB

## 2024-06-20 NOTE — ED Triage Notes (Signed)
 Pt bib [facility] from home for rectal bleeding that has lasted approximately 30 minutes that appears bright red and hard stools for last 3 days. EMS did not see large amounts of blood upon arrival. Pt denies being on blood thinners. Pt does not report any n/v/d.     Yk:rpmmyndpd of liver  EMS VS  152 palpated pressure 94 HR  97% RA  CBG 107 97.6

## 2024-06-20 NOTE — Telephone Encounter (Signed)
 Patient sister called and stated that she was wanting to know if she is able to give her brother the patient some Pepto Bismol due to the patient having diarrhea. Patient sister is requesting a call back at [phone]. Please advise.

## 2024-06-20 NOTE — ED Provider Notes (Addendum)
  EMERGENCY DEPARTMENT AT [facility] Provider Note   CSN: [id number] Arrival date & time: 06/20/24  0522     Patient presents with: Rectal Bleeding   Stephen Carroll is a 58 y.o. male.   58 year old male presents via EMS after bright red blood per rectum after passing hard stool today.  Patient states that he thinks that he ate some bad deli ham.  States he ate a cold cut sandwich with mayonnaise and afterwards had several large firm stools.  He was wearing white shorts and his sister noticed something bright red on his shorts which prompted him to come to the emergency room.  He states he is feeling fine.  He denies any rectal pain, abdominal pain, PA-C feeling weak, light headed or dizzy. No other complaints or concerns, not on thinners.        Prior to Admission medications   Medication Sig Start Date End Date Taking? Authorizing Provider  folic acid  (FOLVITE ) 1 MG tablet Take 1 tablet (1 mg total) by mouth daily. 04/10/23   Macario Dorothyann HERO, MD  furosemide  (LASIX ) 40 MG tablet TAKE 1 TABLET BY MOUTH EVERY DAY 04/25/24   Rosendo Rush, MD  guaiFENesin  (MUCINEX ) 600 MG 12 hr tablet Take 1 tablet (600 mg total) by mouth 2 (two) times daily. 02/04/24   Rosendo Rush, MD  ipratropium (ATROVENT ) 0.03 % nasal spray Place 2 sprays into both nostrils as needed for rhinitis. 10/29/23   Armbruster, Elspeth SQUIBB, MD  Multiple Vitamin (MULTIVITAMIN WITH MINERALS) TABS tablet Take 1 tablet by mouth daily. 04/10/23   Macario Dorothyann HERO, MD  ondansetron  (ZOFRAN -ODT) 4 MG disintegrating tablet Take 1 tablet (4 mg total) by mouth every 8 (eight) hours as needed for nausea or vomiting. 05/22/24   Odell Balls, PA-C  pantoprazole  (PROTONIX ) 20 MG tablet Take 1 tablet (20 mg total) by mouth daily. 05/22/24   Odell Balls, PA-C  potassium chloride  SA (KLOR-CON  M) 20 MEQ tablet Take 1 tablet (20 mEq total) by mouth 2 (two) times daily. 05/22/24   Odell Balls, PA-C  propranolol  (INDERAL )  20 MG tablet TAKE 1 TABLET BY MOUTH TWICE A DAY 10/24/23   Rosendo Rush, MD  spironolactone  (ALDACTONE ) 100 MG tablet TAKE 1 TABLET BY MOUTH EVERY DAY 04/14/24   Rosendo Rush, MD  sucralfate  (CARAFATE ) 1 g tablet Take 1 tablet (1 g total) by mouth 4 (four) times daily -  with meals and at bedtime. 05/22/24   Odell Balls, PA-C  thiamine  (VITAMIN B1) 100 MG tablet Take 1 tablet (100 mg total) by mouth daily. 04/10/23   Macario Dorothyann HERO, MD    Allergies: Patient has no known allergies.    Review of Systems Negative except as per HPI Updated Vital Signs BP (!) 167/102   Pulse 97   Temp 97.9 F (36.6 C) (Oral)   Resp 17   SpO2 100%   Physical Exam Vitals and nursing note reviewed. Exam conducted with a chaperone present.  Constitutional:      General: He is not in acute distress.    Appearance: He is well-developed. He is not diaphoretic.  HENT:     Head: Normocephalic and atraumatic.  Cardiovascular:     Rate and Rhythm: Normal rate and regular rhythm.     Heart sounds: Normal heart sounds.  Pulmonary:     Effort: Pulmonary effort is normal.     Breath sounds: Normal breath sounds.  Abdominal:     Palpations: Abdomen is soft.  Tenderness: There is no abdominal tenderness.  Genitourinary:    Comments: Bright red blood around anus is hemoccult positive. No obvious active bleeding  Neurological:     Mental Status: He is alert and oriented to person, place, and time.  Psychiatric:        Behavior: Behavior normal.     (all labs ordered are listed, but only abnormal results are displayed) Labs Reviewed  CBC WITH DIFFERENTIAL/PLATELET - Abnormal; Notable for the following components:      Result Value   WBC 2.7 (*)    RBC 4.20 (*)    Hemoglobin 11.2 (*)    HCT 33.0 (*)    MCV 78.6 (*)    RDW 15.8 (*)    Platelets 112 (*)    Neutro Abs 1.5 (*)    All other components within normal limits  COMPREHENSIVE METABOLIC PANEL WITH GFR - Abnormal; Notable for the following  components:   Sodium 131 (*)    Chloride 94 (*)    BUN <5 (*)    Total Protein 8.4 (*)    AST 73 (*)    All other components within normal limits  POC OCCULT BLOOD, ED - Abnormal; Notable for the following components:   Fecal Occult Bld POSITIVE (*)    All other components within normal limits    EKG: None  Radiology: No results found.   Procedures   Medications Ordered in the ED - No data to display                                  Medical Decision Making Amount and/or Complexity of Data Reviewed Labs: ordered.   This patient presents to the ED for concern of bright red blood per rectum, this involves an extensive number of treatment options, and is a complaint that carries with it a high risk of complications and morbidity.  The differential diagnosis includes hemorrhoid, anal fissure, lower GI bleed   Co morbidities / Chronic conditions that complicate the patient evaluation  Cirrhosis   Additional history obtained:  Additional history obtained from EMR External records from outside source obtained and reviewed including prior labs on file   Lab Tests:  I Ordered, and personally interpreted labs.  The pertinent results include: Hemoccult positive.  CBC with leukopenia with white count of 2.7, similar to prior on file, hemoglobin is 11.2, not significantly changed.  CMP without significant findings.   Cardiac Monitoring: / EKG:  The patient was maintained on a cardiac monitor.  I personally viewed and interpreted the cardiac monitored which showed an underlying rhythm of: Sinus rhythm, rate 84   Problem List / ED Course / Critical interventions / Medication management  58 year old male presents emergency room after bright red blood per rectum following straining for hard bowel movement.  He does have some bright red blood around his rectum externally which is Hemoccult positive.  CBC without significant change.  CMP without significant findings.  Patient to  follow-up with his GI provider. I have reviewed the patients home medicines and have made adjustments as needed   Social Determinants of Health:  Has PCP   Test / Admission - Considered:  Follow-up with primary care and GI.      Final diagnoses:  Rectal bleeding    ED Discharge Orders     None          Candra Wegner A, PA-C 06/20/24 9357  Beverley Leita LABOR, PA-C 06/20/24 9355    Lorette Mayo, MD 07/01/24 [phone]

## 2024-06-20 NOTE — Telephone Encounter (Signed)
 Pt reported that he was having hard stools when he was seen in the ER. Discussed with pts sister and let her know that the pepto is for diarrhea. He does not have diarrhea so let her know he did not need to take pepto as it is for diarrhea.

## 2024-06-20 NOTE — Discharge Instructions (Signed)
 Recommend over-the-counter stool softener. Follow-up with your GI doctor, call today to schedule appointment.  Can also recheck with your primary care provider.  Return to the ER for severe symptoms.

## 2024-06-23 ENCOUNTER — Ambulatory Visit: Admitting: Family Medicine

## 2024-06-23 VITALS — BP 148/97 | HR 105 | Wt 138.2 lb

## 2024-06-23 DIAGNOSIS — K625 Hemorrhage of anus and rectum: Secondary | ICD-10-CM | POA: Diagnosis not present

## 2024-06-23 NOTE — Progress Notes (Signed)
    SUBJECTIVE:   CHIEF COMPLAINT / HPI:   ED f/u for rectal bleeding Seen 7/11 for BRBPR Hgb 11.2, stable from prior Discharged with outpatient GI f/u (appt scheduled in September)  Has not had recurrent rectal bleeding or melena since ED visit Denies abdominal pain, SOB, lightheadedness, palpitations   PERTINENT  PMH / PSH: Alcoholic Cirrhosis, pancytopenia  OBJECTIVE:   BP (!) 148/97 (BP Location: Left Arm, Patient Position: Sitting, Cuff Size: Normal)   Pulse (!) 105   Wt 138 lb 3.2 oz (62.7 kg)   SpO2 100%   BMI 21.01 kg/m   General: NAD, pleasant, able to participate in exam Respiratory: No respiratory distress Skin: warm and dry, no rashes noted Psych: Normal affect and mood  Rectal exam: No blood or external hemorrhoids seen  ASSESSMENT/PLAN:   Assessment & Plan Rectal bleeding Stable, do not feel we need to recheck hgb today F/u with GI as scheduled Exam unremarkable  Pt left prior to BP/vital sign recheck  Payton Coward, MD [facility]

## 2024-06-23 NOTE — Patient Instructions (Signed)
 Follow up with your GI doctor as scheduled  Please seek medical attention if you have any recurrent bleeding or abdominal pain  Continue your blood pressure medications

## 2024-07-08 ENCOUNTER — Other Ambulatory Visit: Payer: Self-pay | Admitting: Gastroenterology

## 2024-08-01 ENCOUNTER — Other Ambulatory Visit: Payer: Self-pay | Admitting: Student

## 2024-08-01 DIAGNOSIS — K7030 Alcoholic cirrhosis of liver without ascites: Secondary | ICD-10-CM

## 2024-08-13 ENCOUNTER — Ambulatory Visit: Admitting: Physician Assistant

## 2024-08-29 ENCOUNTER — Telehealth: Payer: Self-pay | Admitting: *Deleted

## 2024-08-29 DIAGNOSIS — D61818 Other pancytopenia: Secondary | ICD-10-CM

## 2024-08-29 NOTE — Progress Notes (Signed)
 Complex Care Management Note Care Guide Note  08/29/2024 Name: Stephen Carroll MRN: [id number] DOB: Oct 16, 1966   Complex Care Management Outreach Attempts: An unsuccessful telephone outreach was attempted today to offer the patient information about available complex care management services.  Follow Up Plan:  Additional outreach attempts will be made to offer the patient complex care management information and services.   Encounter Outcome:  No Answer  [name]  [facility]  [facility], [facility] Guide  Direct Dial: [phone]  Fax [phone]

## 2024-08-29 NOTE — Progress Notes (Signed)
 Complex Care Management Note  Care Guide Note 08/29/2024 Name: Stephen Carroll MRN: [id number] DOB: 03-Jun-1966  Stephen Carroll is a 58 y.o. year old male who sees Stephen Rush, MD for primary care. I reached out to Stephen Carroll by phone today to offer complex care management services.  Stephen Carroll was given information about Complex Care Management services today including:   The Complex Care Management services include support from the care team which includes your Nurse Care Manager, Clinical Social Worker, or Pharmacist.  The Complex Care Management team is here to help remove barriers to the health concerns and goals most important to you. Complex Care Management services are voluntary, and the patient may decline or stop services at any time by request to their care team member.   Complex Care Management Consent Status: Patient agreed to services and verbal consent obtained.   Follow up plan:  Telephone appointment with complex care management team member scheduled for:  [facility] 9/24 at [phone]  Encounter Outcome:  Patient Scheduled  [name]  [facility]  [facility], [facility] Guide  Direct Dial: [phone]  Fax [phone]

## 2024-09-03 ENCOUNTER — Other Ambulatory Visit: Payer: Self-pay

## 2024-09-03 ENCOUNTER — Other Ambulatory Visit: Payer: Self-pay | Admitting: *Deleted

## 2024-09-03 NOTE — Patient Instructions (Signed)
 Visit Information  Thank you for taking time to visit with me today. Please don't hesitate to contact me if I can be of assistance to you before our next scheduled appointment.  Our next appointment is by telephone on 09/16/2024 at 11 am Please call the care guide team at [phone] if you need to cancel or reschedule your appointment.   Following is a copy of your care plan:   Goals Addressed             This Visit's Progress    VBCI RN Care Plan       Problems:  Chronic Disease Management support and education needs related to Alcoholic Cirrhosis of liver  Goal: Over the next 90 days the Patient will attend all scheduled medical appointments: PCP and Specialist  as evidenced by keeping all schedule appointments.          continue to work with Medical illustrator and/or Social Worker to address care management and care coordination needs related to Alcoholic Cirrhosis as evidenced by adherence to care management team scheduled appointments     take all medications exactly as prescribed and will call provider for medication related questions as evidenced by compliance.    verbalize basic understanding of Alcoholic Cirrhosis disease process and self health management plan as evidenced by verbal explanation, recognize and monitor symptoms and life style changes.    Interventions:   Evaluation of current treatment plan related to Alcoholic Cirrhosis,  self-management and patient's adherence to plan as established by provider. Discussed plans with patient for ongoing care management follow up and provided patient with direct contact information for care management team Evaluation of current treatment plan related to Alcoholic Cirrhosis and patient's adherence to plan as established by provider Discussed plans with patient for ongoing care management follow up and provided patient with direct contact information for care management team Screening for signs and symptoms of depression related  to chronic disease state  Assessed social determinant of health barriers  Patient Self-Care Activities:  Attend all scheduled provider appointments Call pharmacy for medication refills 3-7 days in advance of running out of medications Call provider office for new concerns or questions  Take medications as prescribed    Plan:  Telephone follow up appointment with care management team member scheduled for:  09/16/24 @ 11 am.               Please call the Suicide and Crisis Lifeline: 988 call the [place]  National Suicide Prevention Lifeline: (864)206-1636 or TTY: 812-016-2130 TTY 253-792-2339) to talk to a trained counselor call 1-800-273-TALK (toll free, 24 hour hotline) go to [facility] [address] [place] [phone]) call 911 if you are experiencing a Mental Health or Behavioral Health Crisis or need someone to talk to.  Patient verbalizes understanding of instructions and care plan provided today and agrees to view in MyChart. Active MyChart status and patient understanding of how to access instructions and care plan via MyChart confirmed with patient.     Cristin Penaflor, RN, BSN, Theatre manager [employer] [phone]

## 2024-09-03 NOTE — Patient Outreach (Signed)
 Complex Care Management   Visit Note  09/03/2024  Name:  Stephen Carroll MRN: [id number] DOB: 02-Oct-1966  Situation: Referral received for Complex Care Management related to Alcoholic Cirrhosis of liver I obtained verbal consent from Patient.  Visit completed with Patient  on the phone  Background:   Past Medical History:  Diagnosis Date   Alcohol use 1985   Alcohol withdrawal seizure (HCC)    last seizure years ago ,not sure of date   Alcoholic cirrhosis of liver with ascites (HCC) 2017   Aneurysm, cerebral, nonruptured 08/09/2015   Arthritis    Hepatic steatosis 03/2024   CT   Migraine 1984   Toe amputee     Assessment: Patient Reported Symptoms:  Cognitive Cognitive Status: No symptoms reported, Able to follow simple commands, Alert and oriented to person, place, and time, Insightful and able to interpret abstract concepts, Normal speech and language skills Cognitive/Intellectual Conditions Management [RPT]: None reported or documented in medical history or problem list   Health Maintenance Behaviors: Annual physical exam, Spiritual practice(s), Healthy diet, Stress management, Sleep adequate Healing Pattern: Average Health Facilitated by: Healthy diet, Prayer/meditation, Rest, Stress management  Neurological Neurological Review of Symptoms: No symptoms reported Neurological Management Strategies: Routine screening Neurological Self-Management Outcome: 4 (good)  HEENT HEENT Symptoms Reported: No symptoms reported HEENT Management Strategies: Adequate rest, Routine screening HEENT Self-Management Outcome: 4 (good)    Cardiovascular Cardiovascular Symptoms Reported: No symptoms reported Does patient have uncontrolled Hypertension?: No Cardiovascular Management Strategies: Adequate rest, Routine screening Cardiovascular Self-Management Outcome: 4 (good)  Respiratory Respiratory Symptoms Reported: No symptoms reported Respiratory Management Strategies: Routine screening,  Adequate rest Respiratory Self-Management Outcome: 4 (good)  Endocrine Endocrine Symptoms Reported: No symptoms reported Is patient diabetic?: No Endocrine Self-Management Outcome: 4 (good)  Gastrointestinal Gastrointestinal Symptoms Reported: No symptoms reported Gastrointestinal Management Strategies: Adequate rest Gastrointestinal Self-Management Outcome: 4 (good)    Genitourinary Genitourinary Symptoms Reported: No symptoms reported Genitourinary Management Strategies: Adequate rest Genitourinary Self-Management Outcome: 4 (good)  Integumentary Integumentary Symptoms Reported: No symptoms reported, Other Other Integumentary Symptoms: reports that he has dry feet and uses lotion and moisturizer to help with dry feet. Skin Management Strategies: Adequate rest, Coping strategies, Routine screening Skin Self-Management Outcome: 3 (uncertain)  Musculoskeletal Additional Musculoskeletal Details: Reports recent surgical removed of 2nd toe.  He reports that this toe has healed. Musculoskeletal Management Strategies: Adequate rest, Routine screening Musculoskeletal Self-Management Outcome: 4 (good) Falls in the past year?: No Number of falls in past year: 1 or less Was there an injury with Fall?: No Fall Risk Category Calculator: 0 Patient Fall Risk Level: Low Fall Risk    Psychosocial Psychosocial Symptoms Reported: No symptoms reported Behavioral Management Strategies: Support system Behavioral Health Self-Management Outcome: 4 (good) Major Change/Loss/Stressor/Fears (CP): Denies Techniques to Cope with Loss/Stress/Change: Support group Quality of Family Relationships: helpful, involved, supportive Do you feel physically threatened by others?: No    09/03/2024    PHQ2-9 Depression Screening   Little interest or pleasure in doing things Not at all  Feeling down, depressed, or hopeless Not at all  PHQ-2 - Total Score 0  Trouble falling or staying asleep, or sleeping too much     Feeling tired or having little energy    Poor appetite or overeating     Feeling bad about yourself - or that you are a failure or have let yourself or your family down    Trouble concentrating on things, such as reading the newspaper or watching television  Moving or speaking so slowly that other people could have noticed.  Or the opposite - being so fidgety or restless that you have been moving around a lot more than usual    Thoughts that you would be better off dead, or hurting yourself in some way    PHQ2-9 Total Score    If you checked off any problems, how difficult have these problems made it for you to do your work, take care of things at home, or get along with other people    Depression Interventions/Treatment      There were no vitals filed for this visit.  Medications Reviewed Today     Reviewed by Jorja Nichole LABOR, RN (Case Manager) on 09/03/24 at 1420  Med List Status: <None>   Medication Order Taking? Sig Documenting Provider Last Dose Status Informant  folic acid  (FOLVITE ) 1 MG tablet [id number]  Take 1 tablet (1 mg total) by mouth daily.  Patient not taking: Reported on 09/03/2024   Macario Dorothyann HERO, MD  Active Self  furosemide  (LASIX ) 40 MG tablet [id number] Yes TAKE 1 TABLET BY MOUTH EVERY DAY Rosendo Rush, MD  Active   guaiFENesin  (MUCINEX ) 600 MG 12 hr tablet [id number]  Take 1 tablet (600 mg total) by mouth 2 (two) times daily.  Patient not taking: Reported on 09/03/2024   Rosendo Rush, MD  Active Self  ipratropium (ATROVENT ) 0.03 % nasal spray [id number]  Place 2 sprays into both nostrils as needed for rhinitis.  Patient not taking: Reported on 09/03/2024   Armbruster, Elspeth SQUIBB, MD  Active Self, Pharmacy Records  Multiple Vitamin (MULTIVITAMIN WITH MINERALS) TABS tablet [id number] Yes Take 1 tablet by mouth daily. Macario Dorothyann HERO, MD  Active Self  ondansetron  (ZOFRAN -ODT) 4 MG disintegrating tablet [id number]  Take 1 tablet (4 mg total) by mouth every 8 (eight)  hours as needed for nausea or vomiting.  Patient not taking: Reported on 09/03/2024   Odell Balls, PA-C  Active   pantoprazole  (PROTONIX ) 20 MG tablet [id number]  Take 1 tablet (20 mg total) by mouth daily.  Patient not taking: Reported on 09/03/2024   Odell Balls, PA-C  Active   potassium chloride  SA (KLOR-CON  M) 20 MEQ tablet [id number] Yes Take 1 tablet (20 mEq total) by mouth 2 (two) times daily. Odell Balls, PA-C  Active   propranolol  (INDERAL ) 20 MG tablet [id number] Yes TAKE 1 TABLET BY MOUTH TWICE A DAY Norbert, John, MD  Active Self           Med Note [facility], DONETA RAMAN   Sat Apr 05, 2024  5:21 AM) Patient said that he is taking this, but the prescription hasn't been filled since 10/2023.  spironolactone  (ALDACTONE ) 100 MG tablet [id number] Yes TAKE 1 TABLET BY MOUTH EVERY DAY Rosendo Rush, MD  Active   sucralfate  (CARAFATE ) 1 g tablet [id number]  Take 1 tablet (1 g total) by mouth 4 (four) times daily -  with meals and at bedtime.  Patient not taking: Reported on 09/03/2024   Odell Balls, PA-C  Active   thiamine  (VITAMIN B1) 100 MG tablet [id number] Yes Take 1 tablet (100 mg total) by mouth daily. Macario Dorothyann HERO, MD  Active Self  Med List Note (Cruthis, Chloe, CPhT 03/15/22 9255):              Recommendation:   PCP Follow-up Continue Current Plan of Care  Follow Up Plan:   Telephone follow-up 2 weeks: 09/16/24 @ 11 am  Theotis Gerdeman, RN, Scientist, research (physical sciences),  [facility] RN Care Manager [employer] [phone]

## 2024-09-08 ENCOUNTER — Telehealth: Payer: Self-pay

## 2024-09-08 ENCOUNTER — Other Ambulatory Visit: Payer: Self-pay | Admitting: Gastroenterology

## 2024-09-08 DIAGNOSIS — K7030 Alcoholic cirrhosis of liver without ascites: Secondary | ICD-10-CM

## 2024-09-08 DIAGNOSIS — I8500 Esophageal varices without bleeding: Secondary | ICD-10-CM

## 2024-09-08 DIAGNOSIS — D649 Anemia, unspecified: Secondary | ICD-10-CM

## 2024-09-08 NOTE — Telephone Encounter (Signed)
-----   Message from [facility] Clarita H sent at 04/25/2024  9:26 AM EDT ----- Regarding: due for labs and Hcc screening in Oct Per SA at [facility] on 04-25-24, patient due for RUQ U/S and labs in October  CBC, CMET, PT/INR and AFP

## 2024-09-08 NOTE — Telephone Encounter (Signed)
 Orders placed.  Scheduled RUQ U/S at [facility] on Mon October 27th at 9:30am. NPO after midnight, arr 30 mins prior. Letter to be mailed to the patient with appointment information and # to call to reschedule if needed.  He will need to go to the lab around the same time.

## 2024-09-09 ENCOUNTER — Other Ambulatory Visit: Payer: Self-pay

## 2024-09-09 ENCOUNTER — Telehealth: Payer: Self-pay | Admitting: Student

## 2024-09-09 ENCOUNTER — Other Ambulatory Visit: Payer: Self-pay | Admitting: Student

## 2024-09-09 ENCOUNTER — Other Ambulatory Visit: Payer: Self-pay | Admitting: Gastroenterology

## 2024-09-09 MED ORDER — ONDANSETRON 4 MG PO TBDP: 4.0000 mg | ORAL_TABLET | Freq: Two times a day (BID) | ORAL | 0 refills | Status: DC | PRN

## 2024-09-09 NOTE — Progress Notes (Signed)
 Refill Zofran  at caregiver's request

## 2024-09-09 NOTE — Telephone Encounter (Signed)
 Patient's sister called stating he needs a refill on his Zofran  please

## 2024-09-16 ENCOUNTER — Other Ambulatory Visit: Payer: Self-pay

## 2024-09-16 ENCOUNTER — Other Ambulatory Visit: Payer: Self-pay | Admitting: *Deleted

## 2024-09-16 DIAGNOSIS — K7030 Alcoholic cirrhosis of liver without ascites: Secondary | ICD-10-CM

## 2024-09-16 NOTE — Patient Instructions (Signed)
 Visit Information  Mr. Stephen Carroll was given information about Medicaid Managed Care team care coordination services as a part of their Healthy [facility] Medicaid benefit. Stephen Carroll   If you would like to schedule transportation through your Healthy [facility] plan, please call the following number at least 2 days in advance of your appointment: [phone]  For information about your ride after you set it up, call Ride Assist at [phone]. Use this number to activate a Will Call pickup, or if your transportation is late for a scheduled pickup. Use this number, too, if you need to make a change or cancel a previously scheduled reservation.  If you need transportation services right away, call [phone]. The after-hours call center is staffed 24 hours to handle ride assistance and urgent reservation requests (including discharges) 365 days a year. Urgent trips include sick visits, hospital discharge requests and life-sustaining treatment.  Call the [facility] Line at [phone], at any time, 24 hours a day, 7 days a week. If you are in danger or need immediate medical attention call 911.   Please see education materials related to Alcoholic Liver disease provided by MyChart link.  Patient verbalizes understanding of instructions and care plan provided today and agrees to view in MyChart. Active MyChart status and patient understanding of how to access instructions and care plan via MyChart confirmed with patient.     Telephone follow up appointment with Managed Medicaid care management team member scheduled for: 10/21/24 @ 1130 am  Mandel Seiden, RN, BSN, [facility] RN Care Manager [facility] Population Health [phone]      Visit Information  Mr. Stephen Carroll was given information about Medicaid Managed Care team care coordination services as a part of their Healthy [facility] Medicaid benefit. Stephen Carroll   If you would like to schedule transportation through your Healthy [facility] plan, please call the following number at least 2 days in advance of your appointment: [phone]  For information about your ride after you set it up, call Ride Assist at [phone]. Use this number to activate a Will Call pickup, or if your transportation is late for a scheduled pickup. Use this number, too, if you need to make a change or cancel a previously scheduled reservation.  If you need transportation services right away, call [phone]. The after-hours call center is staffed 24 hours to handle ride assistance and urgent reservation requests (including discharges) 365 days a year. Urgent trips include sick visits, hospital discharge requests and life-sustaining treatment.  Call the [facility] Line at [phone], at any time, 24 hours a day, 7 days a week. If you are in danger or need immediate medical attention call 911.   Please see education materials related to Alcoholic Liver Disease provided by MyChart link.  Patient verbalizes understanding of instructions and care plan provided today and agrees to view in MyChart. Active MyChart status and patient understanding of how to access instructions and care plan via MyChart confirmed with patient.     Telephone follow up appointment with Managed Medicaid care management team member scheduled for: 10/21/24 @ 11 30 am   Social Worker Scheduled visit- 10/07/24 @10  am  Jinny Sweetland, RN, BSN, ACM RN Care Manager [employer] [phone]

## 2024-09-16 NOTE — Patient Outreach (Signed)
 Complex Care Management   Visit Note  09/16/2024  Name:  Stephen Carroll MRN: [id number] DOB: 1966-04-05  Situation: Referral received for Complex Care Management related to Alcoholic Cirrhosis of liver I obtained verbal consent from Patient.  Visit completed with Patient  on the phone  Background:   Past Medical History:  Diagnosis Date   Alcohol use 1985   Alcohol withdrawal seizure (HCC)    last seizure years ago ,not sure of date   Alcoholic cirrhosis of liver with ascites (HCC) 2017   Aneurysm, cerebral, nonruptured 08/09/2015   Arthritis    Hepatic steatosis 03/2024   CT   Migraine 1984   Toe amputee     Assessment: Outreached completed today with patient and sister.  Patient completed some of the outreach and asked that I also speak with his sister Jon as she looks after him.  Jon reports that she would like resources for Senior living apartments/disabled apartments for her brother.  Jon reports that he use to live by himself and would be interested in resources to promote his independence.   BSW referral placed.   Patient Reported Symptoms:  Cognitive Cognitive Status: No symptoms reported, Able to follow simple commands, Alert and oriented to person, place, and time, Insightful and able to interpret abstract concepts, Normal speech and language skills, Requires Assistance Decision Making Cognitive/Intellectual Conditions Management [RPT]: None reported or documented in medical history or problem list   Health Maintenance Behaviors: Annual physical exam, Sleep adequate, Stress management, Spiritual practice(s) Healing Pattern: Average Health Facilitated by: Healthy diet, Prayer/meditation, Rest, Stress management  Neurological Neurological Review of Symptoms: No symptoms reported Neurological Management Strategies: Adequate rest, Routine screening Neurological Self-Management Outcome: 4 (good)  HEENT        Cardiovascular Cardiovascular Symptoms Reported: No  symptoms reported Does patient have uncontrolled Hypertension?: No Cardiovascular Management Strategies: Adequate rest, Routine screening, Coping strategies Cardiovascular Self-Management Outcome: 4 (good)  Respiratory Respiratory Symptoms Reported: Productive cough Other Respiratory Symptoms: Patient sister reports that patient has been having a productive cough.  Patient's sister Jon reports that she will get get some Mucinex  to assis with the cough and mucus. Respiratory Management Strategies: Adequate rest, Routine screening, Medication therapy Respiratory Self-Management Outcome: 3 (uncertain)  Endocrine Endocrine Symptoms Reported: No symptoms reported Is patient diabetic?: No Endocrine Self-Management Outcome: 4 (good)  Gastrointestinal   Gastrointestinal Management Strategies: Adequate rest Gastrointestinal Self-Management Outcome: 4 (good)    Genitourinary Genitourinary Symptoms Reported: No symptoms reported Genitourinary Management Strategies: Adequate rest Genitourinary Self-Management Outcome: 4 (good)  Integumentary Integumentary Symptoms Reported: Other Other Integumentary Symptoms: Reports dry feet and uses lotions and moisturizer to help with dry feet. Skin Management Strategies: Adequate rest, Coping strategies, Routine screening Skin Self-Management Outcome: 3 (uncertain)  Musculoskeletal Musculoskelatal Symptoms Reviewed: Other Other Musculoskeletal Symptoms: Recent surgical removal of 2nd toe.  Sister reports that toe is healed.  Patient has Podiatry appointment on 09/18/24 Musculoskeletal Management Strategies: Adequate rest, Routine screening Musculoskeletal Self-Management Outcome: 4 (good) Falls in the past year?: No Number of falls in past year: 1 or less Was there an injury with Fall?: No Fall Risk Category Calculator: 0 Patient Fall Risk Level: Low Fall Risk Patient at Risk for Falls Due to: No Fall Risks  Psychosocial Psychosocial Symptoms Reported: No  symptoms reported Behavioral Management Strategies: Support system Behavioral Health Self-Management Outcome: 4 (good) Major Change/Loss/Stressor/Fears (CP): Denies Techniques to Cope with Loss/Stress/Change: Support group Quality of Family Relationships: helpful, involved, supportive Do you feel physically threatened by  others?: No    09/16/2024    PHQ2-9 Depression Screening   Little interest or pleasure in doing things Not at all  Feeling down, depressed, or hopeless Not at all  PHQ-2 - Total Score 0  Trouble falling or staying asleep, or sleeping too much    Feeling tired or having little energy    Poor appetite or overeating     Feeling bad about yourself - or that you are a failure or have let yourself or your family down    Trouble concentrating on things, such as reading the newspaper or watching television    Moving or speaking so slowly that other people could have noticed.  Or the opposite - being so fidgety or restless that you have been moving around a lot more than usual    Thoughts that you would be better off dead, or hurting yourself in some way    PHQ2-9 Total Score    If you checked off any problems, how difficult have these problems made it for you to do your work, take care of things at home, or get along with other people    Depression Interventions/Treatment      There were no vitals filed for this visit.  Medications Reviewed Today     Reviewed by Jorja Nichole LABOR, RN (Case Manager) on 09/16/24 at 1053  Med List Status: <None>   Medication Order Taking? Sig Documenting Provider Last Dose Status Informant  folic acid  (FOLVITE ) 1 MG tablet [id number]  Take 1 tablet (1 mg total) by mouth daily.  Patient not taking: Reported on 09/16/2024   Macario Dorothyann HERO, MD  Active Self  furosemide  (LASIX ) 40 MG tablet [id number] Yes TAKE 1 TABLET BY MOUTH EVERY DAY Rosendo Rush, MD  Active   guaiFENesin  (MUCINEX ) 600 MG 12 hr tablet [id number]  Take 1 tablet (600 mg total)  by mouth 2 (two) times daily.  Patient not taking: Reported on 09/16/2024   Rosendo Rush, MD  Active Self  ipratropium (ATROVENT ) 0.03 % nasal spray [id number]  Place 2 sprays into both nostrils as needed for rhinitis.  Patient not taking: Reported on 09/16/2024   Armbruster, Elspeth SQUIBB, MD  Active Self, Pharmacy Records  Multiple Vitamin (MULTIVITAMIN WITH MINERALS) TABS tablet [id number]  Take 1 tablet by mouth daily.  Patient not taking: Reported on 09/16/2024   Macario Dorothyann HERO, MD  Active Self  ondansetron  (ZOFRAN -ODT) 4 MG disintegrating tablet [id number] Yes Take 1 tablet (4 mg total) by mouth every 12 (twelve) hours as needed for nausea or vomiting. Rosendo Rush, MD  Active   pantoprazole  (PROTONIX ) 20 MG tablet [id number]  Take 1 tablet (20 mg total) by mouth daily.  Patient not taking: Reported on 09/16/2024   Odell Balls, PA-C  Active   potassium chloride  SA (KLOR-CON  M) 20 MEQ tablet [id number]  Take 1 tablet (20 mEq total) by mouth 2 (two) times daily.  Patient not taking: Reported on 09/16/2024   Odell Balls, PA-C  Active   propranolol  (INDERAL ) 20 MG tablet [id number] Yes TAKE 1 TABLET BY MOUTH TWICE A DAY Norbert, John, MD  Active Self           Med Note [facility], DONETA RAMAN   Sat Apr 05, 2024  5:21 AM) Patient said that he is taking this, but the prescription hasn't been filled since 10/2023.  spironolactone  (ALDACTONE ) 100 MG tablet [id number] Yes TAKE 1 TABLET BY MOUTH EVERY DAY Rosendo Rush, MD  Active   sucralfate  (CARAFATE )  1 g tablet [id number]  Take 1 tablet (1 g total) by mouth 4 (four) times daily -  with meals and at bedtime.  Patient not taking: Reported on 09/16/2024   Odell Balls, PA-C  Active   thiamine  (VITAMIN B1) 100 MG tablet [id number]  Take 1 tablet (100 mg total) by mouth daily.  Patient not taking: Reported on 09/16/2024   Macario Dorothyann HERO, MD  Active Self  Med List Note Lorne Sheffield Sola 03/15/22 [id number]):              Recommendation:   PCP  Follow-up Specialty provider follow-up : Podiatry -09/18/24 Continue Current Plan of Care  Follow Up Plan:   Telephone follow-up in 1 month: 10/21/24 @ 11 30 am  Landree Fernholz, RN, Scientist, research (physical sciences), Theatre manager [employer] [phone]

## 2024-09-18 ENCOUNTER — Encounter: Payer: Self-pay | Admitting: Podiatry

## 2024-09-18 ENCOUNTER — Ambulatory Visit: Admitting: Podiatry

## 2024-09-18 DIAGNOSIS — M79674 Pain in right toe(s): Secondary | ICD-10-CM | POA: Diagnosis not present

## 2024-09-18 DIAGNOSIS — M79675 Pain in left toe(s): Secondary | ICD-10-CM

## 2024-09-18 DIAGNOSIS — Z89422 Acquired absence of other left toe(s): Secondary | ICD-10-CM

## 2024-09-18 DIAGNOSIS — B351 Tinea unguium: Secondary | ICD-10-CM | POA: Diagnosis not present

## 2024-09-18 DIAGNOSIS — D492 Neoplasm of unspecified behavior of bone, soft tissue, and skin: Secondary | ICD-10-CM

## 2024-09-18 NOTE — Progress Notes (Signed)
 This patient returns to the office for evaluation and treatment of long thick painful nails .  This patient is unable to trim his own nails since the patient cannot reach his feet.  Patient says the nails are painful walking and wearing his shoes.  He returns for preventive foot care services.  General Appearance  Alert, conversant and in no acute stress.  Vascular  Dorsalis pedis and posterior tibial  pulses are palpable  bilaterally.  Capillary return is within normal limits  bilaterally. Temperature is within normal limits  bilaterally.  Neurologic  Senn-Weinstein monofilament wire test within normal limits  bilaterally. Muscle power within normal limits bilaterally.  Nails Thick disfigured discolored nails with subungual debris  from hallux to fifth toes bilaterally. No evidence of bacterial infection or drainage bilaterally.  Orthopedic  No limitations of motion  feet .  No crepitus or effusions noted.  No bony pathology or digital deformities noted.  Skin  normotropic skin with no porokeratosis noted bilaterally.  No signs of infections or ulcers noted.   Listers corn fifth toes  B/L.  Onychomycosis  Pain in toes right foot  Pain in toes left foot  Debridement  of nails  1-5  B/L with a nail nipper.  Nails were then filed using a dremel tool with no incidents.    RTC 3 months    Cordella Bold DPM

## 2024-10-06 ENCOUNTER — Ambulatory Visit (HOSPITAL_COMMUNITY): Admission: RE | Admit: 2024-10-06 | Source: Ambulatory Visit

## 2024-10-07 ENCOUNTER — Telehealth: Payer: Self-pay

## 2024-10-13 ENCOUNTER — Telehealth: Payer: Self-pay

## 2024-10-13 ENCOUNTER — Ambulatory Visit (HOSPITAL_COMMUNITY)
Admission: RE | Admit: 2024-10-13 | Discharge: 2024-10-13 | Disposition: A | Discharge status: Home / Self Care | Source: Ambulatory Visit | Attending: Gastroenterology | Admitting: Gastroenterology

## 2024-10-13 DIAGNOSIS — D649 Anemia, unspecified: Secondary | ICD-10-CM | POA: Insufficient documentation

## 2024-10-13 DIAGNOSIS — I8500 Esophageal varices without bleeding: Secondary | ICD-10-CM | POA: Insufficient documentation

## 2024-10-13 DIAGNOSIS — K7030 Alcoholic cirrhosis of liver without ascites: Secondary | ICD-10-CM | POA: Diagnosis present

## 2024-10-13 DIAGNOSIS — K7460 Unspecified cirrhosis of liver: Secondary | ICD-10-CM | POA: Diagnosis not present

## 2024-10-13 DIAGNOSIS — K7689 Other specified diseases of liver: Secondary | ICD-10-CM | POA: Diagnosis not present

## 2024-10-13 DIAGNOSIS — R188 Other ascites: Secondary | ICD-10-CM | POA: Diagnosis not present

## 2024-10-13 NOTE — Telephone Encounter (Signed)
 Letter mailed to patient to go to the lab for liver labs

## 2024-10-13 NOTE — Telephone Encounter (Signed)
-----   Message from [facility] sent at 04/30/2024  1:01 PM EDT ----- Regarding: FW: labs Nov  ----- Message ----- From: Edmundo Clarita HERO, CMA Sent: 04/30/2024   9:10 AM EDT To: Clarita HERO Edmundo, CMA Subject: labs Nov                                       Due for labs in Nov Cbc, cmet, ptt inr and afp?

## 2024-10-15 ENCOUNTER — Other Ambulatory Visit: Payer: Self-pay | Admitting: Student

## 2024-10-16 ENCOUNTER — Ambulatory Visit: Payer: Self-pay | Admitting: Gastroenterology

## 2024-10-16 ENCOUNTER — Other Ambulatory Visit: Payer: Self-pay

## 2024-10-16 NOTE — Patient Instructions (Signed)
 Visit Information  Mr. Revelo was given information about Medicaid Managed Care team care coordination services as a part of their Healthy [facility] Medicaid benefit. Garik E Laskaris   If you would like to schedule transportation through your Healthy [facility] plan, please call the following number at least 2 days in advance of your appointment: [phone]  For information about your ride after you set it up, call Ride Assist at [phone]. Use this number to activate a Will Call pickup, or if your transportation is late for a scheduled pickup. Use this number, too, if you need to make a change or cancel a previously scheduled reservation.  If you need transportation services right away, call [phone]. The after-hours call center is staffed 24 hours to handle ride assistance and urgent reservation requests (including discharges) 365 days a year. Urgent trips include sick visits, hospital discharge requests and life-sustaining treatment.  Call the [facility] Line at [phone], at any time, 24 hours a day, 7 days a week. If you are in danger or need immediate medical attention call 911.   Care plan and visit instructions communicated with the patient verbally today. The patient DECLINED to receive copy of care plan and patient instructions in any format.   No further follow up required: patient stated he does not have any needs at this time.  [name], BSW [place]/VBCI - [employer] Social Worker [phone]   Following is a copy of your plan of care:  There are no care plans that you recently modified to display for this patient.

## 2024-10-16 NOTE — Patient Outreach (Signed)
 Social Drivers of Health  Community Resource and Care Coordination Visit Note   10/16/2024  Name: CLENNON NASCA MRN: [id number] DOB:09-07-1966  Situation: Referral received for [facility] needs assessment and assistance related to None. I obtained verbal consent from Patient.  Visit completed with Patient on the phone.   Background:   SDOH Interventions Today    Flowsheet Row Most Recent Value  SDOH Interventions   Food Insecurity Interventions Intervention Not Indicated  [siblings provide food support.]  Housing Interventions Other (Comment)  [BSW spoke with patient and he stated he was good for now and did not need any support with housing or assisted living.]  Transportation Interventions Intervention Not Indicated, Patient Resources (Friends/Family)  [Older brother provides transportation to patient.]  Utilities Interventions Intervention Not Indicated  Financial Strain Interventions Intervention Not Indicated     Assessment:   Goals Addressed             This Visit's Progress    COMPLETED: BSW Goal       Current SDOH Barriers:  None identified and confirmed by patient. Patient stated he is doing ok for now and does not need housing or assisted living resources. Pt states he has a home and his sister and brother provide care for him.  Interventions: Patient interviewed and appropriate screenings performed Discussed plans with patient for ongoing follow up and provided patient with direct contact number          Recommendation:   attend all scheduled provider appointments ask for help if you don't understand your health insurance benefits  Follow Up Plan:   Patient declines further calls or assistance. [employer] will be closed. Patient has been provided contact information should new needs arise.   [name], BSW [place]/VBCI - [employer] Social Worker [phone]

## 2024-10-17 NOTE — Telephone Encounter (Signed)
Returning call. Please advise. °

## 2024-10-17 NOTE — Telephone Encounter (Signed)
 Patients brother called requesting a call back to discuss. Please advise.

## 2024-10-17 NOTE — Telephone Encounter (Signed)
 Lm on patient's brother's voicemail to call me back

## 2024-10-21 ENCOUNTER — Other Ambulatory Visit

## 2024-10-21 ENCOUNTER — Other Ambulatory Visit: Payer: Self-pay | Admitting: *Deleted

## 2024-10-21 ENCOUNTER — Other Ambulatory Visit: Payer: Self-pay

## 2024-10-21 DIAGNOSIS — I8500 Esophageal varices without bleeding: Secondary | ICD-10-CM | POA: Diagnosis not present

## 2024-10-21 DIAGNOSIS — D649 Anemia, unspecified: Secondary | ICD-10-CM | POA: Diagnosis not present

## 2024-10-21 DIAGNOSIS — K7030 Alcoholic cirrhosis of liver without ascites: Secondary | ICD-10-CM

## 2024-10-21 LAB — CBC WITH DIFFERENTIAL/PLATELET
Basophils Absolute: 0.1 K/uL (ref 0.0–0.1)
Basophils Relative: 1.5 % (ref 0.0–3.0)
Eosinophils Absolute: 0.1 K/uL (ref 0.0–0.7)
Eosinophils Relative: 1.8 % (ref 0.0–5.0)
HCT: 33.6 % — ABNORMAL LOW (ref 39.0–52.0)
Hemoglobin: 11.2 g/dL — ABNORMAL LOW (ref 13.0–17.0)
Lymphocytes Relative: 45.6 % (ref 12.0–46.0)
Lymphs Abs: 1.7 K/uL (ref 0.7–4.0)
MCHC: 33.3 g/dL (ref 30.0–36.0)
MCV: 77.7 fl — ABNORMAL LOW (ref 78.0–100.0)
Monocytes Absolute: 0.6 K/uL (ref 0.1–1.0)
Monocytes Relative: 15 % — ABNORMAL HIGH (ref 3.0–12.0)
Neutro Abs: 1.3 K/uL — ABNORMAL LOW (ref 1.4–7.7)
Neutrophils Relative %: 36.1 % — ABNORMAL LOW (ref 43.0–77.0)
Platelets: 207 K/uL (ref 150.0–400.0)
RBC: 4.32 Mil/uL (ref 4.22–5.81)
RDW: 17 % — ABNORMAL HIGH (ref 11.5–15.5)
WBC: 3.7 K/uL — ABNORMAL LOW (ref 4.0–10.5)

## 2024-10-21 LAB — COMPREHENSIVE METABOLIC PANEL WITH GFR
ALT: 27 U/L (ref 0–53)
AST: 40 U/L — ABNORMAL HIGH (ref 0–37)
Albumin: 4.3 g/dL (ref 3.5–5.2)
Alkaline Phosphatase: 58 U/L (ref 39–117)
BUN: 3 mg/dL — ABNORMAL LOW (ref 6–23)
CO2: 27 meq/L (ref 19–32)
Calcium: 9.6 mg/dL (ref 8.4–10.5)
Chloride: 95 meq/L — ABNORMAL LOW (ref 96–112)
Creatinine, Ser: 0.59 mg/dL (ref 0.40–1.50)
GFR: 107.64 mL/min (ref >60.00)
Glucose, Bld: 85 mg/dL (ref 70–99)
Potassium: 4.1 meq/L (ref 3.5–5.1)
Sodium: 132 meq/L — ABNORMAL LOW (ref 135–145)
Total Bilirubin: 0.5 mg/dL (ref 0.2–1.2)
Total Protein: 9 g/dL — ABNORMAL HIGH (ref 6.0–8.3)

## 2024-10-21 LAB — PROTIME-INR
INR: 1.2 ratio — ABNORMAL HIGH (ref 0.8–1.0)
Prothrombin Time: 12.9 s (ref 9.6–13.1)

## 2024-10-21 LAB — TIQ-NTM

## 2024-10-21 NOTE — Patient Outreach (Signed)
 Complex Care Management   Visit Note  10/21/2024  Name:  Stephen Carroll MRN: [id number] DOB: [birth year]  Situation: Referral received for Complex Care Management related to SDOH Barriers:  Housing   Food insecurity and Alcoholic Cirrhosis I obtained verbal consent from Patient And sister Alfonso.  Visit completed with Patient And sister Alfonso  on the phone  Background:   Past Medical History:  Diagnosis Date   Alcohol use 1985   Alcohol withdrawal seizure (HCC)    last seizure years ago ,not sure of date   Alcoholic cirrhosis of liver with ascites (HCC) 2017   Aneurysm, cerebral, nonruptured 08/09/2015   Arthritis    Hepatic steatosis 03/2024   CT   Migraine 1984   Toe amputee     Assessment: Patient Reported Symptoms:  Cognitive Cognitive Status: No symptoms reported, Able to follow simple commands, Insightful and able to interpret abstract concepts, Normal speech and language skills, Requires Assistance Decision Making Cognitive/Intellectual Conditions Management [RPT]: None reported or documented in medical history or problem list   Health Maintenance Behaviors: Annual physical exam, Sleep adequate, Stress management, Spiritual practice(s) Healing Pattern: Average Health Facilitated by: Healthy diet, Prayer/meditation, Rest, Stress management  Neurological   Neurological Management Strategies: Adequate rest, Routine screening Neurological Self-Management Outcome: 4 (good)  HEENT HEENT Symptoms Reported: No symptoms reported HEENT Management Strategies: Adequate rest, Routine screening HEENT Self-Management Outcome: 4 (good)    Cardiovascular Cardiovascular Symptoms Reported: No symptoms reported Does patient have uncontrolled Hypertension?: No Cardiovascular Management Strategies: Adequate rest, Routine screening, Coping strategies Cardiovascular Self-Management Outcome: 4 (good)  Respiratory Respiratory Symptoms Reported: Productive cough Other Respiratory  Symptoms: Patient sister reports that patient cough is much better.  She reports that she has been giving patient Mucinex  to help with cough and congestion. Respiratory Management Strategies: Adequate rest, Routine screening Respiratory Self-Management Outcome: 3 (uncertain)  Endocrine Endocrine Symptoms Reported: No symptoms reported Is patient diabetic?: No Endocrine Self-Management Outcome: 4 (good)  Gastrointestinal Gastrointestinal Symptoms Reported: No symptoms reported Gastrointestinal Management Strategies: Adequate rest Gastrointestinal Self-Management Outcome: 4 (good)    Genitourinary Genitourinary Symptoms Reported: No symptoms reported Genitourinary Management Strategies: Adequate rest Genitourinary Self-Management Outcome: 4 (good)  Integumentary Integumentary Symptoms Reported: Other Other Integumentary Symptoms: Patient sister reports that patient has dry feet and uses lotions and moisturizer to help with dry feet. Skin Management Strategies: Adequate rest, Coping strategies, Routine screening Skin Self-Management Outcome: 3 (uncertain)  Musculoskeletal Musculoskelatal Symptoms Reviewed: No symptoms reported Other Musculoskeletal Symptoms: Patient had recent surgical removal of 2nd toe.  Sister repports that would is total healed and patient is continued to be followed by Podiatry. Musculoskeletal Management Strategies: Adequate rest, Coping strategies, Routine screening Musculoskeletal Self-Management Outcome: 4 (good) Falls in the past year?: No Number of falls in past year: 1 or less Patient at Risk for Falls Due to: No Fall Risks  Psychosocial Psychosocial Symptoms Reported: No symptoms reported Behavioral Management Strategies: Support system Behavioral Health Self-Management Outcome: 4 (good) Major Change/Loss/Stressor/Fears (CP): Denies Techniques to Cope with Loss/Stress/Change: Support group Quality of Family Relationships: helpful, involved, supportive Do you  feel physically threatened by others?: No    10/21/2024    PHQ2-9 Depression Screening   Little interest or pleasure in doing things Not at all  Feeling down, depressed, or hopeless Not at all  PHQ-2 - Total Score 0  Trouble falling or staying asleep, or sleeping too much    Feeling tired or having little energy    Poor appetite or overeating  Feeling bad about yourself - or that you are a failure or have let yourself or your family down    Trouble concentrating on things, such as reading the newspaper or watching television    Moving or speaking so slowly that other people could have noticed.  Or the opposite - being so fidgety or restless that you have been moving around a lot more than usual    Thoughts that you would be better off dead, or hurting yourself in some way    PHQ2-9 Total Score    If you checked off any problems, how difficult have these problems made it for you to do your work, take care of things at home, or get along with other people    Depression Interventions/Treatment      There were no vitals filed for this visit. Pain Scale: 0-10 Pain Score: 0-No pain  Medications Reviewed Today     Reviewed by Jorja Nichole LABOR, RN (Case Manager) on 10/21/24 at 1213  Med List Status: <None>   Medication Order Taking? Sig Documenting Provider Last Dose Status Informant  folic acid  (FOLVITE ) 1 MG tablet [id number] Yes Take 1 tablet (1 mg total) by mouth daily. Macario Dorothyann HERO, MD  Active Self  furosemide  (LASIX ) 40 MG tablet [id number] Yes TAKE 1 TABLET BY MOUTH EVERY DAY Rosendo Rush, MD  Active   guaiFENesin  (MUCINEX ) 600 MG 12 hr tablet [id number] Yes Take 1 tablet (600 mg total) by mouth 2 (two) times daily. Rosendo Rush, MD  Active Self  ipratropium (ATROVENT ) 0.03 % nasal spray [id number] Yes Place 2 sprays into both nostrils as needed for rhinitis. Armbruster, Elspeth SQUIBB, MD  Active Self, Pharmacy Records  Multiple Vitamin (MULTIVITAMIN WITH MINERALS) TABS tablet  [id number] Yes Take 1 tablet by mouth daily. Macario Dorothyann HERO, MD  Active Self  ondansetron  (ZOFRAN -ODT) 4 MG disintegrating tablet [id number] Yes TAKE 1 TABLET BY MOUTH EVERY 12 HOURS AS NEEDED FOR NAUSEA OR VOMITING. Rosendo Rush, MD  Active   pantoprazole  (PROTONIX ) 20 MG tablet [id number] Yes Take 1 tablet (20 mg total) by mouth daily. Odell Balls, PA-C  Active   potassium chloride  SA (KLOR-CON  M) 20 MEQ tablet [id number] Yes Take 1 tablet (20 mEq total) by mouth 2 (two) times daily. Odell Balls, PA-C  Active   propranolol  (INDERAL ) 20 MG tablet [id number] Yes TAKE 1 TABLET BY MOUTH TWICE A DAY Norbert, John, MD  Active Self           Med Note [facility], DONETA RAMAN   Sat Apr 05, 2024  5:21 AM) Patient said that he is taking this, but the prescription hasn't been filled since 10/2023.  spironolactone  (ALDACTONE ) 100 MG tablet [id number] Yes TAKE 1 TABLET BY MOUTH EVERY DAY Rosendo Rush, MD  Active   sucralfate  (CARAFATE ) 1 g tablet [id number] Yes Take 1 tablet (1 g total) by mouth 4 (four) times daily -  with meals and at bedtime. Odell Balls, PA-C  Active   thiamine  (VITAMIN B1) 100 MG tablet [id number] Yes Take 1 tablet (100 mg total) by mouth daily. Macario Dorothyann HERO, MD  Active Self  Med List Note (Cruthis, Chloe, CPhT 03/15/22 9255):              Recommendation:   PCP Follow-up Specialty provider follow-up : Gastroenterology-12/05/24 Continue Current Plan of Care  Follow Up Plan:   Telephone follow-up in 1 month: 11/27/24  Zoltan Genest, RN, BSN, ACM RN Care Manager [facility] Population Health [phone]

## 2024-10-21 NOTE — Patient Instructions (Signed)
 Visit Information  Mr. Reichardt was given information about Medicaid Managed Care team care coordination services as a part of their Healthy [facility] Medicaid benefit. [place]ord[place] E Mar[place]k   If you would like to schedule transportation through your Healthy [facility] plan, please call the following number at least 2 days in advance of your appointment: [phone]  For information about your ride after you set it up, call Ride Assist at [phone]. Use this number to activate a Will Call pickup, or if your transportation is late for a scheduled pickup. Use this number, too, if you need to make a change or cancel a previously scheduled reservation.  If you need transportation services right away, call [phone]. The after-hours call center is staffed 24 hours to handle ride assistance and urgent reservation requests (including discharges) 365 days a year. Urgent trips include sick visits, hospital discharge requests and life-sustaining treatment.  Call the [facility] Line at [phone], at any time, 24 hours a day, 7 days a week. If you are in danger or need immediate medical attention call 911.   Please see education materials related to Alcoholic Cirrhosis provided by MyChart link.  Care plan and visit instructions communicated with the patient verbally today. Patient agrees to receive a copy in MyChart. Active MyChart status and patient understanding of how to access instructions and care plan via MyChart confirmed with patient.     Telephone follow up appointment with Managed Medicaid care management team member scheduled for: 11/27/24 @ 1:30 pm  Joby Richart, RN, BSN, ACM RN Care Manager [employer] [phone]

## 2024-10-22 NOTE — Patient Outreach (Signed)
 Social Drivers of Health  Community Resource and Care Coordination Visit Note   10/22/2024  Name: CAMRAN KEADY MRN: [id number] DOB:August 15, 1966  Situation: Referral received for [facility] needs assessment and assistance related to Housing  Financial Strain  Food Insecurity  utility assistance. I obtained verbal consent from patient's sister.  Visit completed with patient's sister on the phone.   Background:   SDOH Interventions Today    Flowsheet Row Most Recent Value  SDOH Interventions   Food Insecurity Interventions Community Resources Provided  [BSW provided food resources via mail and email to patient sisters email.]  Housing Interventions Community Resources Provided  [housing resources provided via mail and email.]  Transportation Interventions Patient Resources (Friends/Family), Payor Benefit  Utilities Interventions --  [utility assistance resources provided via email and mail.]     Assessment:  BSW received message from [facility] [place] requesting to call patient's sister back to confirm resources for housing and food. BSW spoke with Alfonso Greener and confirmed need for food, housing, and utility resources. BSW will provide all resources via mail and email.   Goals Addressed             This Visit's Progress    BSW goal   On track    Current SDOH Barriers:  Limited access to food Housing Utility assistance  Interventions: Patient interviewed and appropriate screenings performed Referred patient to community resources  Provided patient with information about out of the garden food project, housing resources, and utility assistance programs in [employer] [place] Advised patient to reach out to DSS to inquire about LEIAP program for winter.           Recommendation:   attend all scheduled provider appointments call for transportation assistance at least one week before appointments ask for help if you don't understand your health insurance benefits call and/or follow up  with out of the garden food project for food assistance F/u with DSS about LEIAP winter program.   Follow Up Plan:   Telephone follow up appointment date/time:  10/30/2024 at 3:45pm  [name], BSW [place]/VBCI - [facility] Social Worker [phone]

## 2024-10-22 NOTE — Patient Instructions (Signed)
 Visit Information  Mr. Goga was given information about Medicaid Managed Care team care coordination services as a part of their Healthy [facility] Medicaid benefit. Zaiyden E Degenhart   If you would like to schedule transportation through your Healthy [facility] plan, please call the following number at least 2 days in advance of your appointment: [phone]  For information about your ride after you set it up, call Ride Assist at [phone]. Use this number to activate a Will Call pickup, or if your transportation is late for a scheduled pickup. Use this number, too, if you need to make a change or cancel a previously scheduled reservation.  If you need transportation services right away, call [phone]. The after-hours call center is staffed 24 hours to handle ride assistance and urgent reservation requests (including discharges) 365 days a year. Urgent trips include sick visits, hospital discharge requests and life-sustaining treatment.  Call the [facility] Line at [phone], at any time, 24 hours a day, 7 days a week. If you are in danger or need immediate medical attention call 911.   Please see education materials related to food, housing, and utility resources provided by email.   The patient verbalized understanding of instructions, educational materials, and care plan provided today and DECLINED offer to receive copy of patient instructions, educational materials, and care plan.   Telephone follow up appointment with Managed Medicaid care management team member scheduled for: 10/30/2024 at 3:45pm  [name], BSW [place]/VBCI - [facility] Social Worker [phone]   Following is a copy of your plan of care:  There are no care plans that you recently modified to display for this patient.

## 2024-10-24 LAB — AFP TUMOR MARKER: AFP-Tumor Marker: 12.8 ng/mL — ABNORMAL HIGH (ref <6.1)

## 2024-10-30 ENCOUNTER — Telehealth: Payer: Self-pay

## 2024-10-30 ENCOUNTER — Other Ambulatory Visit: Payer: Self-pay | Admitting: Student

## 2024-10-30 DIAGNOSIS — K7030 Alcoholic cirrhosis of liver without ascites: Secondary | ICD-10-CM

## 2024-10-31 ENCOUNTER — Other Ambulatory Visit: Payer: Self-pay

## 2024-10-31 NOTE — Patient Outreach (Signed)
 Social Drivers of Health  Community Resource and Care Coordination Visit Note   10/31/2024  Name: Stephen Carroll MRN: [id number] DOB:[birth year]  Situation: Referral received for [facility] needs assessment and assistance related to Food Insecurity . I obtained verbal consent from Patient.  Visit completed with Patient and sister on the phone.   Background:   SDOH Interventions Today    Flowsheet Row Most Recent Value  SDOH Interventions   Food Insecurity Interventions --  [food resources were recieved.]  Housing Interventions Intervention Not Indicated  [housing resources provided via mail and email.]  Transportation Interventions Patient Resources (Friends/Family), Payor Benefit  Utilities Interventions Intervention Not Indicated  [utility assistance resources provided via email and mail.]     Assessment:   Goals Addressed             This Visit's Progress    COMPLETED: BSW goal       Current SDOH Barriers:  Limited access to food Housing Utility assistance  Interventions: Patient interviewed and appropriate screenings performed Referred patient to community resources  Provided patient with information about out of the garden food project, housing resources, and utility assistance programs in [place] [place]. Advised patient to reach out to DSS to inquire about LEIAP program for winter.           Recommendation:   attend all scheduled provider appointments call for transportation assistance at least one week before appointments ask for help if you don't understand your health insurance benefits  Follow Up Plan:   Patient declines any other SDOH needs at this time.   [name], BSW [place]/VBCI - [employer] Social Worker [phone]

## 2024-10-31 NOTE — Patient Instructions (Signed)
 Visit Information  Stephen Carroll was given information about Medicaid Managed Care team care coordination services as a part of their Healthy [facility] Medicaid benefit. [name]   If you would like to schedule transportation through your Healthy [facility] plan, please call the following number at least 2 days in advance of your appointment: [phone]  For information about your ride after you set it up, call Ride Assist at [phone]. Use this number to activate a Will Call pickup, or if your transportation is late for a scheduled pickup. Use this number, too, if you need to make a change or cancel a previously scheduled reservation.  If you need transportation services right away, call [phone]. The after-hours call center is staffed 24 hours to handle ride assistance and urgent reservation requests (including discharges) 365 days a year. Urgent trips include sick visits, hospital discharge requests and life-sustaining treatment.  Call the [facility] Line at [phone], at any time, 24 hours a day, 7 days a week. If you are in danger or need immediate medical attention call 911.    The patient verbalized understanding of instructions, educational materials, and care plan provided today and DECLINED offer to receive copy of patient instructions, educational materials, and care plan.   No further follow up required: No other SDOH needs identified by patient or sister.   [name], BSW [place]/VBCI - [employer] Social Worker [phone]   Following is a copy of your plan of care:  There are no care plans that you recently modified to display for this patient.

## 2024-11-27 ENCOUNTER — Other Ambulatory Visit: Payer: Self-pay | Admitting: *Deleted

## 2024-11-27 ENCOUNTER — Other Ambulatory Visit: Payer: Self-pay

## 2024-11-27 NOTE — Patient Outreach (Signed)
 Complex Care Management   Visit Note  11/27/2024  Name:  Stephen Carroll MRN: [id number] DOB: 1966/04/04  Situation: Referral received for Complex Care Management related to SDOH Barriers:  Food insecurity and Alcoholic Cirrhosis I obtained verbal consent from Caregiver Patient.  Visit completed with Caregiver Patient  on the phone  Background:   Past Medical History:  Diagnosis Date   Alcohol use 1985   Alcohol withdrawal seizure (HCC)    last seizure years ago ,not sure of date   Alcoholic cirrhosis of liver with ascites (HCC) 2017   Aneurysm, cerebral, nonruptured 08/09/2015   Arthritis    Hepatic steatosis 03/2024   CT   Migraine 1984   Toe amputee     Assessment: Patient Reported Symptoms:  Cognitive Cognitive Status: No symptoms reported, Able to follow simple commands, Insightful and able to interpret abstract concepts, Normal speech and language skills, Requires Assistance Decision Making Cognitive/Intellectual Conditions Management [RPT]: None reported or documented in medical history or problem list   Health Maintenance Behaviors: Annual physical exam, Sleep adequate, Spiritual practice(s), Stress management Healing Pattern: Average Health Facilitated by: Healthy diet, Prayer/meditation, Rest, Stress management  Neurological   Neurological Management Strategies: Adequate rest, Routine screening Neurological Self-Management Outcome: 4 (good)  HEENT HEENT Symptoms Reported: No symptoms reported HEENT Management Strategies: Adequate rest, Routine screening HEENT Self-Management Outcome: 4 (good)    Cardiovascular Cardiovascular Symptoms Reported: No symptoms reported Does patient have uncontrolled Hypertension?: No Cardiovascular Management Strategies: Adequate rest, Routine screening, Coping strategies Cardiovascular Self-Management Outcome: 4 (good)  Respiratory Respiratory Symptoms Reported: Productive cough Other Respiratory Symptoms: Patient's sister reports  that patient has a productive cought.  She reports that patient is taking Mucinex  to help with help with cough and congestion. Respiratory Management Strategies: Adequate rest, Routine screening Respiratory Self-Management Outcome: 3 (uncertain)  Endocrine Endocrine Symptoms Reported: No symptoms reported Is patient diabetic?: No Endocrine Self-Management Outcome: 4 (good)  Gastrointestinal Gastrointestinal Symptoms Reported: No symptoms reported Gastrointestinal Management Strategies: Adequate rest Gastrointestinal Self-Management Outcome: 4 (good)    Genitourinary Genitourinary Symptoms Reported: No symptoms reported Genitourinary Management Strategies: Adequate rest Genitourinary Self-Management Outcome: 4 (good)  Integumentary Integumentary Symptoms Reported: Other Other Integumentary Symptoms: Patient sister reports that patient has dry feet and uses lotions and moisturizer to help with dry feet. Skin Management Strategies: Adequate rest, Coping strategies, Routine screening Skin Self-Management Outcome: 3 (uncertain)  Musculoskeletal Musculoskelatal Symptoms Reviewed: No symptoms reported Other Musculoskeletal Symptoms: Patient 's sister reports taht surgical site on 2nd toe is completed healed.  Patient is followed by Podiatry. Musculoskeletal Management Strategies: Adequate rest, Coping strategies, Routine screening Musculoskeletal Self-Management Outcome: 4 (good) Falls in the past year?: No Number of falls in past year: 1 or less Was there an injury with Fall?: No Fall Risk Category Calculator: 0 Patient Fall Risk Level: Low Fall Risk Patient at Risk for Falls Due to: No Fall Risks Fall risk Follow up: Falls evaluation completed  Psychosocial Psychosocial Symptoms Reported: No symptoms reported Behavioral Management Strategies: Support system Behavioral Health Self-Management Outcome: 4 (good) Major Change/Loss/Stressor/Fears (CP): Denies Techniques to Cope with  Loss/Stress/Change: Support group Quality of Family Relationships: helpful, involved, supportive Do you feel physically threatened by others?: No    11/27/2024    PHQ2-9 Depression Screening   Little interest or pleasure in doing things Not at all  Feeling down, depressed, or hopeless Not at all  PHQ-2 - Total Score 0  Trouble falling or staying asleep, or sleeping too much    Feeling  tired or having little energy    Poor appetite or overeating     Feeling bad about yourself - or that you are a failure or have let yourself or your family down    Trouble concentrating on things, such as reading the newspaper or watching television    Moving or speaking so slowly that other people could have noticed.  Or the opposite - being so fidgety or restless that you have been moving around a lot more than usual    Thoughts that you would be better off dead, or hurting yourself in some way    PHQ2-9 Total Score    If you checked off any problems, how difficult have these problems made it for you to do your work, take care of things at home, or get along with other people    Depression Interventions/Treatment      There were no vitals filed for this visit. Pain Score: 0-No pain  Medications Reviewed Today     Reviewed by Augustus Zurawski A, RN (Case Manager) on 11/27/24 at 1344  Med List Status: <None>   Medication Order Taking? Sig Documenting Provider Last Dose Status Informant  folic acid  (FOLVITE ) 1 MG tablet [id number] Yes Take 1 tablet (1 mg total) by mouth daily. Macario Dorothyann HERO, MD  Active Self  furosemide  (LASIX ) 40 MG tablet [id number] Yes TAKE 1 TABLET BY MOUTH EVERY DAY Rosendo Norleen BROCKS, MD  Active   guaiFENesin  (MUCINEX ) 600 MG 12 hr tablet [id number] Yes Take 1 tablet (600 mg total) by mouth 2 (two) times daily. Rosendo Norleen BROCKS, MD  Active Self  ipratropium (ATROVENT ) 0.03 % nasal spray [id number] Yes Place 2 sprays into both nostrils as needed for rhinitis. Armbruster, Elspeth SQUIBB, MD   Active Self, Pharmacy Records  Multiple Vitamin (MULTIVITAMIN WITH MINERALS) TABS tablet [id number] Yes Take 1 tablet by mouth daily. Macario Dorothyann HERO, MD  Active Self  ondansetron  (ZOFRAN -ODT) 4 MG disintegrating tablet [id number] Yes TAKE 1 TABLET BY MOUTH EVERY 12 HOURS AS NEEDED FOR NAUSEA OR VOMITING. Rosendo Norleen BROCKS, MD  Active   pantoprazole  (PROTONIX ) 20 MG tablet [id number] Yes Take 1 tablet (20 mg total) by mouth daily. Odell Balls, PA-C  Active   potassium chloride  SA (KLOR-CON  M) 20 MEQ tablet [id number] Yes Take 1 tablet (20 mEq total) by mouth 2 (two) times daily. Odell Balls, PA-C  Active   propranolol  (INDERAL ) 20 MG tablet [id number] Yes TAKE 1 TABLET BY MOUTH TWICE A DAY Rosendo Norleen BROCKS, MD  Active Self           Med Note [facility], DONETA RAMAN   Sat Apr 05, 2024  5:21 AM) Patient said that he is taking this, but the prescription hasn't been filled since 10/2023.  spironolactone  (ALDACTONE ) 100 MG tablet [id number] Yes TAKE 1 TABLET BY MOUTH EVERY DAY Rosendo Norleen BROCKS, MD  Active   sucralfate  (CARAFATE ) 1 g tablet [id number] Yes Take 1 tablet (1 g total) by mouth 4 (four) times daily -  with meals and at bedtime. Odell Balls, PA-C  Active   thiamine  (VITAMIN B1) 100 MG tablet [id number] Yes Take 1 tablet (100 mg total) by mouth daily. Macario Dorothyann HERO, MD  Active Self  Med List Note Lorne Sheffield Sola 03/15/22 [id number]):              Recommendation:   PCP Follow-up Specialty provider follow-up : Gastroenterology- 12/05/24; Podiatry-12/16/24 Continue Current Plan of Care  Follow Up Plan:   Telephone follow-up  in 1 month: 01/16/25 @ 11 am.   Deadrian Toya, RN, BSN, ACM RN Care Manager [employer] [phone]

## 2024-12-05 ENCOUNTER — Encounter: Payer: Self-pay | Admitting: Physician Assistant

## 2024-12-05 ENCOUNTER — Ambulatory Visit: Admitting: Physician Assistant

## 2024-12-05 VITALS — BP 132/74 | HR 82 | Ht 68.0 in | Wt 143.5 lb

## 2024-12-05 DIAGNOSIS — I8500 Esophageal varices without bleeding: Secondary | ICD-10-CM

## 2024-12-05 DIAGNOSIS — K7031 Alcoholic cirrhosis of liver with ascites: Secondary | ICD-10-CM | POA: Diagnosis not present

## 2024-12-05 DIAGNOSIS — K824 Cholesterolosis of gallbladder: Secondary | ICD-10-CM | POA: Diagnosis not present

## 2024-12-05 DIAGNOSIS — K769 Liver disease, unspecified: Secondary | ICD-10-CM

## 2024-12-05 DIAGNOSIS — F1010 Alcohol abuse, uncomplicated: Secondary | ICD-10-CM | POA: Diagnosis not present

## 2024-12-05 DIAGNOSIS — R110 Nausea: Secondary | ICD-10-CM

## 2024-12-05 DIAGNOSIS — F1090 Alcohol use, unspecified, uncomplicated: Secondary | ICD-10-CM

## 2024-12-05 DIAGNOSIS — K7030 Alcoholic cirrhosis of liver without ascites: Secondary | ICD-10-CM

## 2024-12-05 DIAGNOSIS — K219 Gastro-esophageal reflux disease without esophagitis: Secondary | ICD-10-CM

## 2024-12-05 MED ORDER — ONDANSETRON 4 MG PO TBDP: 4.0000 mg | ORAL_TABLET | ORAL | 1 refills | Status: AC | PRN

## 2024-12-05 MED ORDER — FUROSEMIDE 40 MG PO TABS: 40.0000 mg | ORAL_TABLET | Freq: Every day | ORAL | 3 refills | Status: AC

## 2024-12-05 MED ORDER — PROPRANOLOL HCL 20 MG PO TABS: 20.0000 mg | ORAL_TABLET | Freq: Two times a day (BID) | ORAL | 3 refills | Status: AC

## 2024-12-05 MED ORDER — SPIRONOLACTONE 100 MG PO TABS: 100.0000 mg | ORAL_TABLET | Freq: Every day | ORAL | 3 refills | Status: AC

## 2024-12-05 MED ORDER — PANTOPRAZOLE SODIUM 20 MG PO TBEC: 20.0000 mg | DELAYED_RELEASE_TABLET | Freq: Every day | ORAL | 3 refills | Status: AC

## 2024-12-05 NOTE — Patient Instructions (Signed)
 Please follow up sooner if symptoms increase or worsen  Due to recent changes in healthcare laws, you may see the results of your imaging and laboratory studies on MyChart before your provider has had a chance to review them.  We understand that in some cases there may be results that are confusing or concerning to you. Not all laboratory results come back in the same time frame and the provider may be waiting for multiple results in order to interpret others.  Please give us  48 hours in order for your provider to thoroughly review all the results before contacting the office for clarification of your results.   Thank you for trusting me with your gastrointestinal care!   [name], PA-C _______________________________________________________  If your blood pressure at your visit was 140/90 or greater, please contact your primary care physician to follow up on this.  _______________________________________________________  If you are age 76 or older, your body mass index should be between 23-30. Your Body mass index is 21.82 kg/m. If this is out of the aforementioned range listed, please consider follow up with your Primary Care Provider.  If you are age 39 or younger, your body mass index should be between 19-25. Your Body mass index is 21.82 kg/m. If this is out of the aformentioned range listed, please consider follow up with your Primary Care Provider.   ________________________________________________________  The [facility] providers would like to encourage you to use MYCHART to communicate with providers for non-urgent requests or questions.  Due to long hold times on the telephone, sending your provider a message by [facility] may be a faster and more efficient way to get a response.  Please allow 48 business hours for a response.  Please remember that this is for non-urgent requests.  _______________________________________________________
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
## 2024-12-05 NOTE — Progress Notes (Signed)
 Agree with assessment and plan as outlined.

## 2024-12-16 ENCOUNTER — Ambulatory Visit: Admitting: Podiatry

## 2024-12-16 ENCOUNTER — Encounter: Payer: Self-pay | Admitting: Podiatry

## 2024-12-16 DIAGNOSIS — M79674 Pain in right toe(s): Secondary | ICD-10-CM

## 2024-12-16 DIAGNOSIS — B351 Tinea unguium: Secondary | ICD-10-CM | POA: Diagnosis not present

## 2024-12-16 DIAGNOSIS — M79675 Pain in left toe(s): Secondary | ICD-10-CM | POA: Diagnosis not present

## 2024-12-16 DIAGNOSIS — Z89422 Acquired absence of other left toe(s): Secondary | ICD-10-CM

## 2024-12-16 NOTE — Progress Notes (Signed)
 This patient returns to the office for evaluation and treatment of long thick painful nails .  This patient is unable to trim his own nails since the patient cannot reach his feet.  Patient says the nails are painful walking and wearing his shoes.  He returns for preventive foot care services.  General Appearance  Alert, conversant and in no acute stress.  Vascular  Dorsalis pedis and posterior tibial  pulses are palpable  bilaterally.  Capillary return is within normal limits  bilaterally. Temperature is within normal limits  bilaterally.  Neurologic  Senn-Weinstein monofilament wire test within normal limits  bilaterally. Muscle power within normal limits bilaterally.  Nails Thick disfigured discolored nails with subungual debris  from hallux to fifth toes bilaterally. No evidence of bacterial infection or drainage bilaterally.  Orthopedic  No limitations of motion  feet .  No crepitus or effusions noted.  No bony pathology or digital deformities noted.  Skin  normotropic skin with no porokeratosis noted bilaterally.  No signs of infections or ulcers noted.   Listers corn fifth toes  B/L.  Onychomycosis  Pain in toes right foot  Pain in toes left foot  Debridement  of nails  1-5  B/L with a nail nipper.  Nails were then filed using a dremel tool with no incidents.    RTC 10 weeks    Cordella Bold DPM

## 2025-01-02 ENCOUNTER — Other Ambulatory Visit: Payer: Self-pay

## 2025-01-02 ENCOUNTER — Emergency Department (HOSPITAL_COMMUNITY)

## 2025-01-02 ENCOUNTER — Inpatient Hospital Stay (HOSPITAL_COMMUNITY)
Admission: EM | Admit: 2025-01-02 | Discharge: 2025-01-03 | Disposition: A | Attending: Emergency Medicine | Admitting: Emergency Medicine

## 2025-01-02 ENCOUNTER — Encounter (HOSPITAL_COMMUNITY): Payer: Self-pay | Admitting: Emergency Medicine

## 2025-01-02 DIAGNOSIS — E871 Hypo-osmolality and hyponatremia: Secondary | ICD-10-CM | POA: Insufficient documentation

## 2025-01-02 DIAGNOSIS — Y9301 Activity, walking, marching and hiking: Secondary | ICD-10-CM | POA: Diagnosis not present

## 2025-01-02 DIAGNOSIS — N390 Urinary tract infection, site not specified: Secondary | ICD-10-CM | POA: Diagnosis not present

## 2025-01-02 DIAGNOSIS — Y900 Blood alcohol level of less than 20 mg/100 ml: Secondary | ICD-10-CM | POA: Diagnosis not present

## 2025-01-02 DIAGNOSIS — S4992XA Unspecified injury of left shoulder and upper arm, initial encounter: Secondary | ICD-10-CM | POA: Insufficient documentation

## 2025-01-02 DIAGNOSIS — R112 Nausea with vomiting, unspecified: Secondary | ICD-10-CM

## 2025-01-02 DIAGNOSIS — M79602 Pain in left arm: Secondary | ICD-10-CM

## 2025-01-02 DIAGNOSIS — W01198A Fall on same level from slipping, tripping and stumbling with subsequent striking against other object, initial encounter: Secondary | ICD-10-CM | POA: Insufficient documentation

## 2025-01-02 DIAGNOSIS — F1010 Alcohol abuse, uncomplicated: Secondary | ICD-10-CM | POA: Diagnosis not present

## 2025-01-02 DIAGNOSIS — A419 Sepsis, unspecified organism: Secondary | ICD-10-CM | POA: Diagnosis not present

## 2025-01-02 DIAGNOSIS — R319 Hematuria, unspecified: Secondary | ICD-10-CM | POA: Diagnosis not present

## 2025-01-02 DIAGNOSIS — Z789 Other specified health status: Secondary | ICD-10-CM

## 2025-01-02 DIAGNOSIS — Z79899 Other long term (current) drug therapy: Secondary | ICD-10-CM | POA: Insufficient documentation

## 2025-01-02 DIAGNOSIS — R000 Tachycardia, unspecified: Secondary | ICD-10-CM | POA: Diagnosis present

## 2025-01-02 DIAGNOSIS — R0789 Other chest pain: Secondary | ICD-10-CM | POA: Insufficient documentation

## 2025-01-02 LAB — URINE DRUG SCREEN
Amphetamines: NEGATIVE
Barbiturates: NEGATIVE
Benzodiazepines: NEGATIVE
Cocaine: NEGATIVE
Fentanyl: NEGATIVE
Methadone Scn, Ur: NEGATIVE
Opiates: NEGATIVE
Tetrahydrocannabinol: POSITIVE — AB

## 2025-01-02 LAB — URINALYSIS, ROUTINE W REFLEX MICROSCOPIC
Bilirubin Urine: NEGATIVE
Glucose, UA: NEGATIVE mg/dL
Ketones, ur: NEGATIVE mg/dL
Nitrite: POSITIVE — AB
Protein, ur: NEGATIVE mg/dL
Specific Gravity, Urine: 1.009 (ref 1.005–1.030)
pH: 7 (ref 5.0–8.0)

## 2025-01-02 LAB — I-STAT CG4 LACTIC ACID, ED: Lactic Acid, Venous: 1.8 mmol/L (ref 0.5–1.9)

## 2025-01-02 LAB — CBC
HCT: 31.9 % — ABNORMAL LOW (ref 39.0–52.0)
Hemoglobin: 11.2 g/dL — ABNORMAL LOW (ref 13.0–17.0)
MCH: 24.8 pg — ABNORMAL LOW (ref 26.0–34.0)
MCHC: 35.1 g/dL (ref 30.0–36.0)
MCV: 70.7 fL — ABNORMAL LOW (ref 80.0–100.0)
Platelets: 149 K/uL — ABNORMAL LOW (ref 150–400)
RBC: 4.51 MIL/uL (ref 4.22–5.81)
RDW: 16.3 % — ABNORMAL HIGH (ref 11.5–15.5)
WBC: 3.6 K/uL — ABNORMAL LOW (ref 4.0–10.5)
nRBC: 0 % (ref 0.0–0.2)

## 2025-01-02 LAB — TROPONIN T, HIGH SENSITIVITY: Troponin T High Sensitivity: 12 ng/L (ref 0–19)

## 2025-01-02 LAB — BASIC METABOLIC PANEL WITH GFR
Anion gap: 15 (ref 5–15)
BUN: <5 mg/dL — ABNORMAL LOW (ref 6–20)
CO2: 24 mmol/L (ref 22–32)
Calcium: 9.4 mg/dL (ref 8.9–10.3)
Chloride: 85 mmol/L — ABNORMAL LOW (ref 98–111)
Creatinine, Ser: 0.71 mg/dL (ref 0.61–1.24)
GFR, Estimated: >60 mL/min
Glucose, Bld: 100 mg/dL — ABNORMAL HIGH (ref 70–99)
Potassium: 3.6 mmol/L (ref 3.5–5.1)
Sodium: 124 mmol/L — ABNORMAL LOW (ref 135–145)

## 2025-01-02 LAB — ETHANOL: Alcohol, Ethyl (B): <15 mg/dL

## 2025-01-02 MED ORDER — OXYCODONE-ACETAMINOPHEN 5-325 MG PO TABS
1.0000 | ORAL_TABLET | Freq: Once | ORAL | Status: AC
  Administered 2025-01-02: 1 via ORAL
  Filled 2025-01-02: qty 1

## 2025-01-02 MED ORDER — LACTATED RINGERS IV BOLUS
1000.0000 mL | Freq: Once | INTRAVENOUS | Status: AC
  Administered 2025-01-02: 1000 mL via INTRAVENOUS

## 2025-01-02 MED ORDER — IBUPROFEN 400 MG PO TABS
600.0000 mg | ORAL_TABLET | Freq: Once | ORAL | Status: AC
  Administered 2025-01-02: 600 mg via ORAL
  Filled 2025-01-02: qty 1

## 2025-01-02 MED ORDER — SODIUM CHLORIDE 0.9 % IV SOLN
1.0000 g | Freq: Once | INTRAVENOUS | Status: AC
  Administered 2025-01-02: 1 g via INTRAVENOUS
  Filled 2025-01-02: qty 10

## 2025-01-02 NOTE — ED Notes (Signed)
 Patient transported to X-ray

## 2025-01-02 NOTE — H&P (Shared)
 "    Hospital Admission History and Physical Service Pager: [phone]  Patient name: Stephen Carroll Medical record number: [id number] Date of Birth: 1966/04/28 Age: 59 y.o. Gender: male  Primary Care Provider: Toma Matas, MD  Consultants: None Code Status: Full Preferred Emergency Contact:  Contact Information     Name Relation Home Work Mobile   Stephen Carroll,Stephen Carroll Brother [phone]  [phone]   Stephen Carroll, Stephen Carroll [phone]  [phone]      Other Contacts   None on File      Chief Complaint: Fall w/ left ribs pain  Differential and Medical Decision Making:  Stephen Carroll is a 59 y.o. 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 He presenting after Fall EKG found to have tachycardia  Differential for this patient's presentation of this includes  Marijuana use : + THC on UDS w/ Hx of Marijuana use. This may contribute to his inc HR  Dehydration : Report N/V in the last 2 days (potential viral gastro). Na 124 w/ dry skin on Home Lasix  however labs show normal sCr and normal sp urine gravity  PE very less likely but should consider in differential especially w/ hx of cirrhosis even thought he is on RA w/ no hx of PE or clot. Pending CT PE to rule out PE   Hyperthyroidism : Should be consider in differential - Pending TSH  Alcohol withdraw : Less likely. Pt states has been drinking every day and last drink was before admission. Will continue CIWA protocol  Anemia : Unlikely but should not be excluded. Pending Iron panel, Ferritin, Vit B12  CHF less likely as No s/s of heart disease, CXR (-) Cardiomegaly.  Assessment & Plan Tachycardia - Admit to FMTS, attending Dr. Rumball - Vital signs per floor - Antiemesis : Zofran  4 mg Q6H PRN - Bowel Reg: MiraLax  - Fall precautions - PT/OT consult  Chronic health problem Cirrhosis : Lasix  40 mg daily, Aldactone  100 mg daily GERD : Protonix  20 mg  Daily EtOH : Continue CIWA protocol  Rib pain Likely contusion 2/2 fall. Imaging neg for fractures. - Pain control: Tylenol  650 mg Q6H PRN, Oxycodone  5 mg Q6H PRN  FEN/GI: Regular VTE Prophylaxis: Lovenox   Disposition: Med -Tele  History of Present Illness:  Stephen Carroll is a 59 y.o. 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 Reports 2 days ago, was moving something heavy, tripped and hit his Left ribs on a corner of counter and has been having pain since then. He also reports some N/V but has been able to tolerate PO intake.  Last vomited today. He denies blood in emesis.  Last BM this AM. Denies  HA, Dizziness, cough, congestion, Palpitation, SOB, abdominal pain, dysuria, or blood in urine.   In the ED, incidentally noted elevated HR in 120s-140s. 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   Pertinent Past Surgical History: Second toe amp (2023)   Remainder reviewed in history tab.  Pertinent Social History: Tobacco use: smokes occasional cigars Alcohol use: 2-3 beers a day, last drink today at noon Other Substance use: none   Pertinent Family History: None contributing   Objective: BP (!) 156/82   Pulse ROLLEN)  116   Temp 100 F (37.8 C) (Rectal)   Resp (!) 22   Ht 5' 8 (1.727 m)   Wt 70.3 kg   SpO2 99%   BMI 23.57 kg/m  Physical Exam Constitutional:      Appearance: Normal appearance.  Cardiovascular:     Rate and Rhythm: Regular rhythm. Tachycardia present.     Pulses: Normal pulses.          Dorsalis pedis pulses are 2+ on the right side and 2+ on the left side.  Pulmonary:     Effort: Pulmonary effort is normal.  Abdominal:     Palpations: Abdomen is soft.   Musculoskeletal:     Left foot: Bunion present.     Left Lower Extremity: (2nd toe amp) Feet:     Right foot:     Skin integrity: Dry skin and fissure present.     Left foot:     Skin integrity: Callus, dry skin and fissure present.  Neurological:     Mental Status: He is alert and oriented to person, place, and time. Mental status is at baseline.  Psychiatric:        Mood and Affect: Mood normal.        Behavior: Behavior normal.      Labs:  CBC BMET  Recent Labs  Lab 01/02/25 1859  WBC 3.6*  HGB 11.2*  HCT 31.9*  PLT 149*   Recent Labs  Lab 01/02/25 1859  NA 124*  K 3.6  CL 85*  CO2 24  BUN <5*  CREATININE 0.71  GLUCOSE 100*  CALCIUM  9.4     EKG:  NST Vent. rate 158 BPM PR interval 122 ms QRS duration 74 ms QT/QTcB 240/389 ms P-R-T axes 22 -29 50   Imaging Studies Performed: Radiographs w/o significant finding  Suzen Houston NOVAK, DO 01/02/2025, 11:36 PM PGY-1, [facility]  FPTS Intern pager: [phone], text pages welcome Secure chat group [facility] Teaching Service     "

## 2025-01-02 NOTE — ED Triage Notes (Signed)
 PT came in POV for hiccups, dehydration, fall with pain to L. Arm, hx of ETOH.

## 2025-01-02 NOTE — ED Provider Notes (Signed)
 " [place] [facility] Provider Note   CSN: [id number] Arrival date & time: 01/02/25  1800     Patient presents with: Shoulder Injury   Stephen Carroll is a 59 y.o. male with past medical history of EtOH abuse, cirrhosis, cerebral aneurysm, pancytopenia, chronic hyponatremia, migraine presents to emergency department for evaluation of hiccups, dehydration, vomiting for past 2 days, fall onto his left side today.  Reports that he was walking when he hit his arm on side of table. No dizziness, weakness, visual disturbances. drinks alcohol daily.  Last drink was today at noon.  No complaints of tactile disturbances, headache, chest pain, anxiety, hallucinations    Shoulder Injury       Prior to Admission medications  Medication Sig Start Date End Date Taking? Authorizing Provider  folic acid  (FOLVITE ) 1 MG tablet Take 1 tablet (1 mg total) by mouth daily. 04/10/23   Macario Dorothyann HERO, MD  furosemide  (LASIX ) 40 MG tablet Take 1 tablet (40 mg total) by mouth daily. 12/05/24   [place], PA-C  guaiFENesin  (MUCINEX ) 600 MG 12 hr tablet Take 1 tablet (600 mg total) by mouth 2 (two) times daily. 02/04/24   Norbert, John C, MD  ipratropium (ATROVENT ) 0.03 % nasal spray Place 2 sprays into both nostrils as needed for rhinitis. 10/29/23   Armbruster, Elspeth SQUIBB, MD  Multiple Vitamin (MULTIVITAMIN WITH MINERALS) TABS tablet Take 1 tablet by mouth daily. 04/10/23   Macario Dorothyann HERO, MD  ondansetron  (ZOFRAN -ODT) 4 MG disintegrating tablet Take 1 tablet (4 mg total) by mouth as needed for nausea or vomiting. 12/05/24   [place], PA-C  pantoprazole  (PROTONIX ) 20 MG tablet Take 1 tablet (20 mg total) by mouth daily. 12/05/24   [place], PA-C  potassium chloride  SA (KLOR-CON  M) 20 MEQ tablet Take 1 tablet (20 mEq total) by mouth 2 (two) times daily. 05/22/24   Odell Balls, PA-C  propranolol  (INDERAL ) 20 MG tablet Take 1 tablet (20 mg total) by mouth 2 (two)  times daily. 12/05/24   [place], PA-C  spironolactone  (ALDACTONE ) 100 MG tablet Take 1 tablet (100 mg total) by mouth daily. 12/05/24   [place], PA-C  sucralfate  (CARAFATE ) 1 g tablet Take 1 tablet (1 g total) by mouth 4 (four) times daily -  with meals and at bedtime. 05/22/24   Odell Balls, PA-C  thiamine  (VITAMIN B1) 100 MG tablet Take 1 tablet (100 mg total) by mouth daily. 04/10/23   Macario Dorothyann HERO, MD    Allergies: Patient has no known allergies.    Review of Systems  Gastrointestinal:  Positive for vomiting.    Updated Vital Signs BP (!) 151/93   Pulse 90   Temp 100 F (37.8 C) (Rectal)   Resp (!) 22   Ht 5' 8 (1.727 m)   Wt 70.3 kg   SpO2 99%   BMI 23.57 kg/m   Physical Exam Vitals and nursing note reviewed.  Constitutional:      General: He is not in acute distress.    Appearance: Normal appearance.  HENT:     Head: Normocephalic and atraumatic.  Eyes:     Conjunctiva/sclera: Conjunctivae normal.  Cardiovascular:     Rate and Rhythm: Tachycardia present.     Comments: 108bpm-150bpm Pulmonary:     Effort: Pulmonary effort is normal. No respiratory distress.  Abdominal:     General: Bowel sounds are normal. There is no distension.     Palpations: Abdomen is  soft.     Tenderness: There is no abdominal tenderness. There is no guarding or rebound.  Skin:    Coloration: Skin is not jaundiced or pale.  Neurological:     Mental Status: He is alert and oriented to person, place, and time. Mental status is at baseline.     (all labs ordered are listed, but only abnormal results are displayed) Labs Reviewed  CBC - Abnormal; Notable for the following components:      Result Value   WBC 3.6 (*)    Hemoglobin 11.2 (*)    HCT 31.9 (*)    MCV 70.7 (*)    MCH 24.8 (*)    RDW 16.3 (*)    Platelets 149 (*)    All other components within normal limits  BASIC METABOLIC PANEL WITH GFR - Abnormal; Notable for the following components:   Sodium 124 (*)     Chloride 85 (*)    Glucose, Bld 100 (*)    BUN <5 (*)    All other components within normal limits  URINALYSIS, ROUTINE W REFLEX MICROSCOPIC - Abnormal; Notable for the following components:   APPearance HAZY (*)    Hgb urine dipstick SMALL (*)    Nitrite POSITIVE (*)    Leukocytes,Ua TRACE (*)    Bacteria, UA MANY (*)    All other components within normal limits  URINE DRUG SCREEN - Abnormal; Notable for the following components:   Tetrahydrocannabinol POSITIVE (*)    All other components within normal limits  URINE CULTURE  CULTURE, BLOOD (ROUTINE X 2)  CULTURE, BLOOD (ROUTINE X 2)  ETHANOL  I-STAT CG4 LACTIC ACID, ED  I-STAT CG4 LACTIC ACID, ED  TROPONIN T, HIGH SENSITIVITY  TROPONIN T, HIGH SENSITIVITY    EKG: EKG Interpretation Date/Time:  Friday January 02 2025 18:20:07 EST Ventricular Rate:  158 PR Interval:  122 QRS Duration:  74 QT Interval:  240 QTC Calculation: 389 R Axis:   -29  Text Interpretation: Sinus tachycardia Possible Anterior infarct , age undetermined Abnormal ECG When compared with ECG of 20-Jun-2024 05:42, PREVIOUS ECG IS PRESENT Since last tracing rate faster Confirmed by Dean Clarity [phone]) on 01/02/2025 9:09:44 PM  Radiology: [facility] Ribs Unilateral W/Chest Left Result Date: 01/02/2025 CLINICAL DATA:  Fall with arm pain EXAM: LEFT RIBS AND CHEST - 3+ VIEW COMPARISON:  05/22/2024 FINDINGS: Single-view chest demonstrates no acute airspace disease or effusion. Normal cardiac size. No pneumothorax. Left rib series demonstrates no acute displaced left rib fracture IMPRESSION: Negative. Electronically Signed   By: Luke Bun M.D.   On: 01/02/2025 21:45   DG Elbow Complete Left Result Date: 01/02/2025 CLINICAL DATA:  Arm pain EXAM: LEFT ELBOW - COMPLETE 3+ VIEW COMPARISON:  None Available. FINDINGS: No fracture or malalignment. No significant effusion. Mild spurring at the coronoid process IMPRESSION: No acute osseous abnormality. Electronically Signed    By: Luke Bun M.D.   On: 01/02/2025 21:44   DG Humerus Left Result Date: 01/02/2025 CLINICAL DATA:  Fall with arm pain EXAM: LEFT HUMERUS - 2+ VIEW COMPARISON:  Chest x-ray 01/05/2020 FINDINGS: No definitive fracture or malalignment. 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 Probable ghost track in the left humeral head/greater tuberosity region. IMPRESSION: No acute osseous abnormality. Electronically Signed   By: Luke Bun M.D.   On: 01/02/2025 21:43   DG Shoulder Left Result Date: 01/02/2025 CLINICAL DATA:  Fall with arm pain EXAM: LEFT SHOULDER - 2+ VIEW COMPARISON:  Chest x-ray 01/05/2020 FINDINGS: No  definite acute displaced fracture or malalignment. AC joint and glenohumeral degenerative change. Prominent calcific tendinopathy. Amorphous soft tissue and bony calcification about the proximal humerus is chronic and could reflect remote trauma/heterotopic ossification. IMPRESSION: 1. No definite acute osseous abnormality. 2. AC joint and glenohumeral degenerative change. Prominent calcific tendinopathy. Electronically Signed   By: Luke Bun M.D.   On: 01/02/2025 21:42     .Critical Care  Performed by: Minnie Tinnie BRAVO, PA Authorized by: Minnie Tinnie BRAVO, PA   Critical care provider statement:    Critical care time (minutes):  30   Critical care was necessary to treat or prevent imminent or life-threatening deterioration of the following conditions:  Sepsis   Critical care was time spent personally by me on the following activities:  Development of treatment plan with patient or surrogate, discussions with consultants, evaluation of patient's response to treatment, examination of patient, ordering and review of laboratory studies, ordering and review of radiographic studies, ordering and performing treatments and interventions, pulse oximetry, re-evaluation of patient's condition and review of old charts   Care discussed with: admitting provider      Medications  Ordered in the ED  oxyCODONE -acetaminophen  (PERCOCET/ROXICET) 5-325 MG per tablet 1 tablet (1 tablet Oral Given 01/02/25 2058)  lactated ringers  bolus 1,000 mL (1,000 mLs Intravenous New Bag/Given 01/02/25 2231)  cefTRIAXone  (ROCEPHIN ) 1 g in sodium chloride  0.9 % 100 mL IVPB (0 g Intravenous Stopped 01/02/25 2221)  ibuprofen  (ADVIL ) tablet 600 mg (600 mg Oral Given 01/02/25 2148)                                    Medical Decision Making Amount and/or Complexity of Data Reviewed Labs: ordered. Radiology: ordered.  Risk Prescription drug management.   Patient presents to the ED for concern of left arm pain, nausea, vomiting, this involves an extensive number of treatment options, and is a complaint that carries with it a high risk of complications and morbidity.  The differential diagnosis includes UTI, sepsis, infection, PNA, dehydration, electrolyte abnormality, drug toxidrome   Co morbidities that complicate the patient evaluation  Etoh abuse See hpi   Additional history obtained:  Additional history obtained from Nursing and Outside Medical Records   External records from outside source obtained and reviewed including triage RN note   Lab Tests:  I Ordered, and personally interpreted labs.  The pertinent results include:   UA notable for small Hgb, positive nitrates, trace leukocytes, many bacteria Sodium 124 CBG 100 WBC 3.6 Hgb 11.2 PLT 149 UDS THC positive    Cardiac Monitoring:  The patient was maintained on a cardiac monitor.  I personally viewed and interpreted the cardiac monitored which showed an underlying rhythm of: ST at 158bpm with no ischemic changes   Medicines ordered and prescription drug management:  I ordered medication including rocephin , IVF, motrin   for UTI, pain, fever  Reevaluation of the patient after these medicines showed that the patient improved I have reviewed the patients home medicines and have made adjustments as  needed   Critical Interventions:  sepsis   Consultations Obtained:  I requested consultation with the FM service Resident Elicia MD staffed with Dr. Rumball,  and discussed lab and imaging findings as well as pertinent plan - they accept patient for admission   Problem List / ED Course:  Sepsis UTI Vital signs notable for tachycardia trending from 150-116bpm.  Initial temperature was 97.8 F.  Rectal 100F No complaints of CP, SHOB Tachycardia wo hypoxia Pancytopenia Provided patient with IVF, Rocephin , Motrin  for hydration, UTI, fever.   CXR wo PNA nor effusion Obtained lactic following 500 cc of IVF.  Lactic 1.8. Started with Motrin  for fever as patient has a history of alcoholic cirrhosis.  Last liver enzymes noted to be AST 40 on 10/21/2024.  No improvement of tachycardia.  Temperature increased to 100F  Fall Left arm pain Left side pain Left shoulder, left humerus, left elbow wo fracture nor dislocation No signs of vascular injury. Radial 2+ TTP of left thoracic cage. CXR wo traumatic injury No head injury no LOC No additional complains of pain nor injury by patient nor found on PE  ETOH abuse No complaints of NV, skin disturbances, hallucinations, HA, CP, anxiety No tremors No signs of withdrawal currently Last drink was at noon Ethanol negative Last CMP on 10/21/24 AST 40  Vomiting 2 episodes yesterday No abd tenderness  Hyponatremia Chronically low 2/2 etoh abuse Trends 125-132 over past 11 months   Reevaluation:  After the interventions noted above, I reevaluated the patient and found that they have :improved    Dispostion:  After consideration of the diagnostic results and the patients response to treatment, I feel that the patent would benefit from admission for sepsis 2/2 UTI.   Discussed ED workup with patient who expresses understanding and agrees to plan  Final diagnoses:  Left arm pain  Nausea and vomiting, unspecified vomiting type   Sepsis, due to unspecified organism, unspecified whether acute organ dysfunction present [facility])  Urinary tract infection with hematuria, site unspecified  Hyponatremia    ED Discharge Orders     None        Minnie Tinnie BRAVO, PA 01/03/25 [id number]    Dean Clarity, MD 01/03/25 1743  "

## 2025-01-03 ENCOUNTER — Other Ambulatory Visit (HOSPITAL_COMMUNITY): Payer: Self-pay

## 2025-01-03 ENCOUNTER — Emergency Department (HOSPITAL_COMMUNITY)

## 2025-01-03 DIAGNOSIS — Z789 Other specified health status: Secondary | ICD-10-CM

## 2025-01-03 DIAGNOSIS — R000 Tachycardia, unspecified: Secondary | ICD-10-CM | POA: Diagnosis present

## 2025-01-03 DIAGNOSIS — R0789 Other chest pain: Secondary | ICD-10-CM | POA: Insufficient documentation

## 2025-01-03 LAB — COMPREHENSIVE METABOLIC PANEL WITH GFR
ALT: 26 U/L (ref 0–44)
AST: 48 U/L — ABNORMAL HIGH (ref 15–41)
Albumin: 3.9 g/dL (ref 3.5–5.0)
Alkaline Phosphatase: 79 U/L (ref 38–126)
Anion gap: 13 (ref 5–15)
BUN: <5 mg/dL — ABNORMAL LOW (ref 6–20)
CO2: 23 mmol/L (ref 22–32)
Calcium: 9.4 mg/dL (ref 8.9–10.3)
Chloride: 89 mmol/L — ABNORMAL LOW (ref 98–111)
Creatinine, Ser: 0.8 mg/dL (ref 0.61–1.24)
GFR, Estimated: >60 mL/min
Glucose, Bld: 130 mg/dL — ABNORMAL HIGH (ref 70–99)
Potassium: 3.7 mmol/L (ref 3.5–5.1)
Sodium: 125 mmol/L — ABNORMAL LOW (ref 135–145)
Total Bilirubin: 0.9 mg/dL (ref 0.0–1.2)
Total Protein: 8.7 g/dL — ABNORMAL HIGH (ref 6.5–8.1)

## 2025-01-03 LAB — VITAMIN B12: Vitamin B-12: 603 pg/mL (ref 180–914)

## 2025-01-03 LAB — IRON AND TIBC
Iron: 91 ug/dL (ref 45–182)
Saturation Ratios: 18 % (ref 17.9–39.5)
TIBC: 514 ug/dL — ABNORMAL HIGH (ref 250–450)
UIBC: 423 ug/dL

## 2025-01-03 LAB — CBC
HCT: 34.7 % — ABNORMAL LOW (ref 39.0–52.0)
Hemoglobin: 12.2 g/dL — ABNORMAL LOW (ref 13.0–17.0)
MCH: 25 pg — ABNORMAL LOW (ref 26.0–34.0)
MCHC: 35.2 g/dL (ref 30.0–36.0)
MCV: 71.1 fL — ABNORMAL LOW (ref 80.0–100.0)
Platelets: 121 10*3/uL — ABNORMAL LOW (ref 150–400)
RBC: 4.88 MIL/uL (ref 4.22–5.81)
RDW: 16.7 % — ABNORMAL HIGH (ref 11.5–15.5)
WBC: 2.8 10*3/uL — ABNORMAL LOW (ref 4.0–10.5)
nRBC: 0 % (ref 0.0–0.2)

## 2025-01-03 LAB — HEMOGLOBIN A1C
Hgb A1c MFr Bld: 5.4 % (ref 4.8–5.6)
Mean Plasma Glucose: 108.28 mg/dL

## 2025-01-03 LAB — FERRITIN: Ferritin: 77 ng/mL (ref 24–336)

## 2025-01-03 LAB — HIV ANTIBODY (ROUTINE TESTING W REFLEX): HIV Screen 4th Generation wRfx: NONREACTIVE

## 2025-01-03 LAB — TROPONIN T, HIGH SENSITIVITY: Troponin T High Sensitivity: 9 ng/L (ref 0–19)

## 2025-01-03 LAB — I-STAT CG4 LACTIC ACID, ED: Lactic Acid, Venous: 1.4 mmol/L (ref 0.5–1.9)

## 2025-01-03 LAB — TSH: TSH: 1.1 u[IU]/mL (ref 0.350–4.500)

## 2025-01-03 MED ORDER — OXYCODONE HCL 5 MG PO TABS: 5.0000 mg | ORAL_TABLET | Freq: Four times a day (QID) | ORAL | Status: DC | PRN

## 2025-01-03 MED ORDER — ENOXAPARIN SODIUM 40 MG/0.4ML IJ SOSY
40.0000 mg | PREFILLED_SYRINGE | INTRAMUSCULAR | Status: DC
  Administered 2025-01-03: 40 mg via SUBCUTANEOUS
  Filled 2025-01-03: qty 0.4

## 2025-01-03 MED ORDER — ADULT MULTIVITAMIN W/MINERALS CH
1.0000 | ORAL_TABLET | Freq: Every day | ORAL | Status: DC
  Administered 2025-01-03: 1 via ORAL
  Filled 2025-01-03: qty 1

## 2025-01-03 MED ORDER — ACETAMINOPHEN 325 MG PO TABS: 650.0000 mg | ORAL_TABLET | Freq: Four times a day (QID) | ORAL | Status: DC | PRN

## 2025-01-03 MED ORDER — FOLIC ACID 1 MG PO TABS
1.0000 mg | ORAL_TABLET | Freq: Every day | ORAL | Status: DC
  Administered 2025-01-03: 1 mg via ORAL
  Filled 2025-01-03: qty 1

## 2025-01-03 MED ORDER — ONDANSETRON HCL 4 MG PO TABS: 4.0000 mg | ORAL_TABLET | Freq: Four times a day (QID) | ORAL | Status: DC | PRN

## 2025-01-03 MED ORDER — PROPRANOLOL HCL 10 MG PO TABS
20.0000 mg | ORAL_TABLET | Freq: Two times a day (BID) | ORAL | Status: DC
  Administered 2025-01-03 (×2): 20 mg via ORAL
  Filled 2025-01-03 (×2): qty 2

## 2025-01-03 MED ORDER — THIAMINE HCL 100 MG/ML IJ SOLN
100.0000 mg | Freq: Every day | INTRAMUSCULAR | Status: DC
  Filled 2025-01-03: qty 2

## 2025-01-03 MED ORDER — ONDANSETRON HCL 4 MG/2ML IJ SOLN: 4.0000 mg | Freq: Four times a day (QID) | INTRAMUSCULAR | Status: DC | PRN

## 2025-01-03 MED ORDER — FUROSEMIDE 20 MG PO TABS
40.0000 mg | ORAL_TABLET | Freq: Every day | ORAL | Status: DC
  Administered 2025-01-03: 40 mg via ORAL
  Filled 2025-01-03: qty 2

## 2025-01-03 MED ORDER — ACETAMINOPHEN 500 MG PO TABS: 500.0000 mg | ORAL_TABLET | Freq: Four times a day (QID) | ORAL | Status: DC | PRN

## 2025-01-03 MED ORDER — HYDROXYZINE HCL 10 MG PO TABS
10.0000 mg | ORAL_TABLET | Freq: Two times a day (BID) | ORAL | Status: DC | PRN
  Administered 2025-01-03: 10 mg via ORAL
  Filled 2025-01-03: qty 1

## 2025-01-03 MED ORDER — POTASSIUM CHLORIDE CRYS ER 20 MEQ PO TBCR
20.0000 meq | EXTENDED_RELEASE_TABLET | Freq: Two times a day (BID) | ORAL | 0 refills | Status: AC
  Filled 2025-01-03: qty 6, 3d supply, fill #0

## 2025-01-03 MED ORDER — PANTOPRAZOLE SODIUM 20 MG PO TBEC
20.0000 mg | DELAYED_RELEASE_TABLET | Freq: Every day | ORAL | Status: DC
  Administered 2025-01-03: 20 mg via ORAL
  Filled 2025-01-03: qty 1

## 2025-01-03 MED ORDER — IOHEXOL 350 MG/ML SOLN
75.0000 mL | Freq: Once | INTRAVENOUS | Status: AC | PRN
  Administered 2025-01-03: 75 mL via INTRAVENOUS

## 2025-01-03 MED ORDER — POTASSIUM CHLORIDE CRYS ER 20 MEQ PO TBCR
20.0000 meq | EXTENDED_RELEASE_TABLET | Freq: Two times a day (BID) | ORAL | Status: DC
  Administered 2025-01-03: 20 meq via ORAL
  Filled 2025-01-03: qty 1

## 2025-01-03 MED ORDER — THIAMINE MONONITRATE 100 MG PO TABS
100.0000 mg | ORAL_TABLET | Freq: Every day | ORAL | Status: DC
  Administered 2025-01-03: 100 mg via ORAL
  Filled 2025-01-03: qty 1

## 2025-01-03 MED ORDER — POLYETHYLENE GLYCOL 3350 17 G PO PACK
17.0000 g | PACK | Freq: Every day | ORAL | Status: DC
  Filled 2025-01-03: qty 1

## 2025-01-03 MED ORDER — SPIRONOLACTONE 100 MG PO TABS
100.0000 mg | ORAL_TABLET | Freq: Every day | ORAL | Status: DC
  Administered 2025-01-03: 100 mg via ORAL
  Filled 2025-01-03: qty 4
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
## 2025-01-03 NOTE — Evaluation (Signed)
 Physical Therapy Evaluation Patient Details Name: Stephen Carroll MRN: [id number] DOB: [birth year] Today's Date: 01/03/2025  History of Present Illness  59 y.o. male presents to [facility] 01/02/25 after falling with tachycardia and rib pain. Imaging negative for fxs. PMHx: EtOH, cirrhosis, pancytopenia, chronic hyponatremia, migraine, and Left 2nd toe amp  Clinical Impression  PTA pt was independent for mobility with no AD. Pt reported being at mobility baseline being ModI for transfers and gait with no AD. Pt reported cognitive deficits ever since being in a coma for two years with difficulty following commands and increased time to process. Pt tends to drift right and left with dynamic balance testing, however, lower score also likely due to difficulty following commands. No overt LOB throughout. Pt has no further acute or post-acute PT needs as he is at baseline. Pt reported feeling comfortable d/c home when medically stable. Will sign off. Please re-consult if new needs arise.         If plan is discharge home, recommend the following: Assist for transportation;Help with stairs or ramp for entrance   Can travel by private vehicle    Yes    Equipment Recommendations None recommended by PT     Functional Status Assessment Patient has not had a recent decline in their functional status     Precautions / Restrictions Precautions Precautions: Fall Recall of Precautions/Restrictions: Intact Restrictions Weight Bearing Restrictions Per Provider Order: No      Mobility  Bed Mobility Overal bed mobility: Independent   Transfers Overall transfer level: Modified independent Equipment used: None   Ambulation/Gait Ambulation/Gait assistance: Modified independent (Device/Increase time) Gait Distance (Feet): 400 Feet Assistive device: None Gait Pattern/deviations: Narrow base of support, Drifts right/left, Step-through pattern, Decreased stride length, Decreased dorsiflexion - right,  Decreased dorsiflexion - left Gait velocity: decr     General Gait Details: Limited foot clearance with R foot positioned in ER. Narrow BOS with pt drifting right/left with challenges to balance. Steady throughout with pt reporting being at mobility baseline     Balance Overall balance assessment: Needs assistance Sitting-balance support: Feet supported, No upper extremity supported Sitting balance-Leahy Scale: Fair     Standing balance support: No upper extremity supported Standing balance-Leahy Scale: Fair    Standardized Balance Assessment Standardized Balance Assessment : Dynamic Gait Index   Dynamic Gait Index Level Surface: Mild Impairment Change in Gait Speed: Moderate Impairment Gait with Horizontal Head Turns: Mild Impairment Gait with Vertical Head Turns: Mild Impairment Gait and Pivot Turn: Normal Step Over Obstacle: Normal Step Around Obstacles: Mild Impairment Steps: Moderate Impairment Total Score: 16       Pertinent Vitals/Pain Pain Assessment Pain Assessment: Faces Faces Pain Scale: Hurts a little bit Pain Location: ribs Pain Descriptors / Indicators: Aching, Discomfort Pain Intervention(s): Limited activity within patient's tolerance, Monitored during session, Repositioned    Home Living Family/patient expects to be discharged to:: Private residence Living Arrangements: Children (sister) Available Help at Discharge: Family;Available PRN/intermittently Type of Home: House Home Access: Level entry    Home Layout: Two level;Full bath on main level;Able to live on main level with bedroom/bathroom Home Equipment: Grab bars - tub/shower;Grab bars - toilet;Rolling Walker (2 wheels)      Prior Function Prior Level of Function : Independent/Modified Independent;Driving      Mobility Comments: Ind with no AD, fell over shoes ADLs Comments: Sister assists with driving     Extremity/Trunk Assessment   Upper Extremity Assessment Upper Extremity  Assessment: Defer to OT evaluation  Lower Extremity Assessment Lower Extremity Assessment: Generalized weakness    Cervical / Trunk Assessment Cervical / Trunk Assessment: Normal  Communication   Communication Communication: No apparent difficulties    Cognition Arousal: Alert Behavior During Therapy: WFL for tasks assessed/performed   PT - Cognitive impairments: History of cognitive impairments, Orientation, Awareness, Memory, Attention, Sequencing, Problem solving, Safety/Judgement   Orientation impairments: Time      PT - Cognition Comments: reported hx of cognitive impairments after being in a coma for two year. Difficulty following commands and understanding instructions Following commands: Impaired Following commands impaired: Follows one step commands inconsistently, Follows one step commands with increased time     Cueing Cueing Techniques: Verbal cues     General Comments General comments (skin integrity, edema, etc.): HR 120s with activity     PT Assessment Patient does not need any further PT services         PT Goals (Current goals can be found in the Care Plan section)  Acute Rehab PT Goals PT Goal Formulation: All assessment and education complete, DC therapy     AM-PAC PT 6 Clicks Mobility  Outcome Measure Help needed turning from your back to your side while in a flat bed without using bedrails?: None Help needed moving from lying on your back to sitting on the side of a flat bed without using bedrails?: None Help needed moving to and from a bed to a chair (including a wheelchair)?: None Help needed standing up from a chair using your arms (e.g., wheelchair or bedside chair)?: None Help needed to walk in hospital room?: None Help needed climbing 3-5 steps with a railing? : A Little 6 Click Score: 23    End of Session   Activity Tolerance: Patient tolerated treatment well Patient left: in bed;with call bell/phone within reach Nurse  Communication: Mobility status PT Visit Diagnosis: Other abnormalities of gait and mobility (R26.89);Muscle weakness (generalized) (M62.81)    Time: 9256-9240 PT Time Calculation (min) (ACUTE ONLY): 16 min   Charges:   PT Evaluation $PT Eval Low Complexity: 1 Low   PT General Charges $$ ACUTE PT VISIT: 1 Visit       Kate ORN, PT, DPT Secure Chat Preferred  Rehab Office [phone]   Kate BRAVO Wendolyn 01/03/2025, 8:44 AM

## 2025-01-03 NOTE — Assessment & Plan Note (Addendum)
-   Admit to FMTS, attending Dr. Madelon - Vital signs per floor - Antiemesis : Zofran  4 mg Q6H PRN - Bowel Reg: MiraLax  - Fall precautions - PT/OT consult

## 2025-01-03 NOTE — ED Notes (Signed)
 Pt ambulated independently with steady gait to restroom & back to treatment room.

## 2025-01-03 NOTE — Discharge Instructions (Addendum)
 Dear Stephen Carroll,   Thank you for letting us  participate in your care! In this section, you will find a brief hospital admission summary of why you were admitted to the hospital, what happened during your admission, your diagnosis/diagnoses, and recommended follow up.   You were admitted for an elevated heart rate. We got an EKG which showed a normal rhythm. Your heart returned to normal by the time  you were discharged.  You also had rib pain which we think is from the fall you had a few days ago. Continue using tylenol  as needed.   MEDICATION CHANGES - None  POST-HOSPITAL & CARE INSTRUCTIONS We recommend following up with your PCP within 1 week from being discharged from the hospital. Please let PCP/Specialists know of any changes in medications that were made which you will be able to see in the medications section of this packet. Go to your follow up appointments (listed below)  DOCTOR'S APPOINTMENTS & FOLLOW UP Future Appointments  Date Time Provider Department Center  01/16/2025 11:00 AM Jorja Nichole LABOR, RN [facility] None  03/17/2025  3:45 PM Loreda Hacker, DPM [facility]      Thank you for choosing [facility]! Take care and be well!  Family Medicine Teaching Service Inpatient Team [place]  [facility]  [address] [place], [place] [address] [phone]

## 2025-01-03 NOTE — Assessment & Plan Note (Addendum)
 Cirrhosis : Lasix  40 mg daily, Aldactone  100 mg daily GERD : Protonix  20 mg Daily EtOH : Continue CIWA protocol

## 2025-01-03 NOTE — ED Notes (Signed)
 Pt is requesting something for anxiety. States he feels like he needs something to calm his nerves. Denies any pain.

## 2025-01-03 NOTE — ED Notes (Signed)
 Pt up ambulating to restroom with steady gait independently.

## 2025-01-03 NOTE — Discharge Summary (Addendum)
 "  Family Medicine Teaching [facility] Discharge Summary  Patient name: Stephen Carroll Medical record number: [id number] Date of birth: [birth year] Age: 59 y.o. Gender: male Date of Admission: 01/02/2025  Date of Discharge: 12/03/2025 Admitting Physician: [place] KATHEE Samuels, DO  Primary Care Provider: Toma Matas, MD Consultants: None  Indication for Hospitalization: Tachycardia  Discharge Diagnoses/Problem List:  Principal Problem for Admission:  Other Problems addressed during stay:  Principal Problem:   Tachycardia Active Problems:   Chronic health problem   Rib pain   Brief Hospital Course:  Stephen Carroll is a 50 y.o.male with a history of EtOH, cirrhosis, pancytopenia, chronic hyponatremia, migraine, and Left 2nd toe amp who was admitted to the [facility] Teaching Service at [facility] for Tachycardia after Fall. His hospital course is detailed below:  Tachycardia [name] presented to the ED c/o left ribs pain after Fall 2 days ago and N/V. In ED he found to be tachycardic with HR 120-140s but EKG with sinus tachycardia. CTA PE without pulmonary embolism or acute pulmonary findings. Anemia panel, TSH wnl. Patient was given a 1L bolus and atarax  in the ED with which he showed improvement. Likely multifactorial, marijuana use vs dehydration given N/V. Tachycardia improved by the time of discharge.  Rib pain Likely contusion 2/2 to fall. Imaging negative for acute fractures. Patient did well with conservative measures for pain management.  Other chronic conditions were medically managed with home medications and formulary alternatives as necessary (Cirrhosis, GERD, EtOH use, HTN) - EtOH use - CIWAs continued while admitted and remained negative  PCP Follow-up Recommendations:  Recheck CBC and BMP in 2 weeks Recommend discussing alcohol cessation resources Titrate blood pressure medications to ensure BP at goal    Results/Tests Pending at Time of Discharge:  Unresulted  Labs (From admission, onward)     Start     Ordered   01/03/25 0019  HIV Antibody (routine testing w rflx)  (HIV Antibody (Routine testing w reflex) panel)  Once,   R        01/03/25 0025   01/02/25 2243  Urine Culture  Add-on,   AD       Question:  Indication  Answer:  Dysuria   01/02/25 2242             Disposition: Home  Discharge Condition: Stable  Discharge Exam:  Vitals:   01/03/25 0900 01/03/25 1009  BP: (!) 169/95   Pulse: 91   Resp: 14   Temp:  (!) 100.5 F (38.1 C)  SpO2: 100%    General: Alert, well-appearing male in NAD.  Cardiovascular: RRR, no m/r/g appreciated. Radial pulse +2 bilaterally Pulmonary: Normal WOB. CTAB with no w/c/r present Abdomen: Soft, non-tender, non-distended. Extremities: Warm, no edema Neurologic: No focal deficits, moves all four extremities appropriately  Significant Procedures: None  Significant Labs and Imaging:  Recent Labs  Lab 01/02/25 1859 01/03/25 0333  WBC 3.6* 2.8*  HGB 11.2* 12.2*  HCT 31.9* 34.7*  PLT 149* 121*   Recent Labs  Lab 01/02/25 1859 01/03/25 0333  NA 124* 125*  K 3.6 3.7  CL 85* 89*  CO2 24 23  GLUCOSE 100* 130*  BUN <5* <5*  CREATININE 0.71 0.80  CALCIUM  9.4 9.4  ALKPHOS  --  79  AST  --  48*  ALT  --  26  ALBUMIN  --  3.9    Pertinent Imaging:  CT Angio Chest Pulmonary Embolism (PE) W or WO Contrast Result Date: 01/03/2025 1. No  pulmonary embolism.  2. No acute pulmonary abnormality  DG Ribs Unilateral W/Chest Left Result Date: 01/02/2025 Negative  DG Elbow Complete Left Result Date: 01/02/2025 No acute osseous abnormality  DG Humerus Left Result Date: 01/02/2025 No acute osseous abnormality  DG Shoulder Left Result Date: 01/02/2025 1. No definite acute osseous abnormality.  2. AC joint and glenohumeral degenerative change. Prominent calcific tendinopathy  Discharge Medications:  Allergies as of 01/03/2025   No Known Allergies      Medication List     TAKE these  medications    furosemide  40 MG tablet Commonly known as: LASIX  Take 1 tablet (40 mg total) by mouth daily.   Icy Hot Original Pain Relief 10-30 % Crea Apply 1 Application topically as needed (muscle and joint pain).   naproxen sodium 220 MG tablet Commonly known as: ALEVE Take 220 mg by mouth daily as needed (pain/headache).   ondansetron  4 MG disintegrating tablet Commonly known as: ZOFRAN -ODT Take 1 tablet (4 mg total) by mouth as needed for nausea or vomiting.   pantoprazole  20 MG tablet Commonly known as: PROTONIX  Take 1 tablet (20 mg total) by mouth daily.   potassium chloride  SA 20 MEQ tablet Commonly known as: KLOR-CON  M Take 1 tablet (20 mEq total) by mouth 2 (two) times daily.   propranolol  20 MG tablet Commonly known as: INDERAL  Take 1 tablet (20 mg total) by mouth 2 (two) times daily.   spironolactone  100 MG tablet Commonly known as: ALDACTONE  Take 1 tablet (100 mg total) by mouth daily.   tetrahydrozoline 0.05 % ophthalmic solution Place 2 drops into both eyes 2 (two) times daily.        Discharge Instructions: Please refer to Patient Instructions section of EMR for full details.  Patient was counseled important signs and symptoms that should prompt return to medical care, changes in medications, dietary instructions, activity restrictions, and follow up appointments.   Follow-Up Appointments:   Bhagat, Virali, DO 01/03/2025, 11:48 AM PGY-1, [facility]   FPTS Upper-Level Resident Addendum   I have discussed the above with Dr. Jerrie and agree with the documented plan. My edits for correction/addition/clarification are included above. Please see any attending notes.   Payton Coward, MD PGY-3, [facility] 01/03/2025 12:00 PM  FPTS Service pager: [phone] (text pages welcome through [employer]) "

## 2025-01-03 NOTE — Hospital Course (Addendum)
 Stephen Carroll is a 59 y.o.male with a history of EtOH, cirrhosis, pancytopenia, chronic hyponatremia, migraine, and Left 2nd toe amp who was admitted to the [facility] Teaching Service at [facility] for Tachycardia after Fall. His hospital course is detailed below:  Tachycardia Stephen Carroll presented to the ED c/o left ribs pain after Fall 2 days ago and N/V. In ED he found to be tachycardic with HR 120-140s but EKG with sinus tachycardia. CTA PE without pulmonary embolism or acute pulmonary findings. Anemia panel, TSH wnl. Patient was given a 1L bolus and atarax  in the ED with which he showed improvement. Likely multifactorial, marijuana use vs dehydration given N/V. Tachycardia improved by the time of discharge.  Rib pain Likely contusion 2/2 to fall. Imaging negative for acute fractures. Patient did well with conservative measures for pain management.  Other chronic conditions were medically managed with home medications and formulary alternatives as necessary (Cirrhosis, GERD, EtOH use, HTN) - EtOH use - CIWAs continued while admitted and remained negative  PCP Follow-up Recommendations:  Recheck CBC and BMP in 2 weeks Recommend discussing alcohol cessation resources Titrate blood pressure medications to ensure BP at goal

## 2025-01-03 NOTE — Assessment & Plan Note (Signed)
 Likely contusion 2/2 fall. Imaging neg for fractures. - Pain control: Tylenol  650 mg Q6H PRN, Oxycodone  5 mg Q6H PRN

## 2025-01-03 NOTE — ED Provider Notes (Incomplete)
 " [facility] Provider Note   CSN: [id number] Arrival date & time: 01/02/25  1800     Patient presents with: Shoulder Injury   Stephen Carroll is a 59 y.o. male with past medical history of EtOH abuse, cirrhosis, cerebral aneurysm, pancytopenia, chronic hyponatremia, migraine presents to emergency department for evaluation of hiccups, dehydration, vomiting for past 2 days, fall onto his left side today.  Uses alcohol daily.  Last drink was today at noon.  No complaints of tactile disturbances, headache, chest pain, anxiety, hallucinations  {Add pertinent medical, surgical, social history, OB history to YEP:67052}  Shoulder Injury       Prior to Admission medications  Medication Sig Start Date End Date Taking? Authorizing Provider  folic acid  (FOLVITE ) 1 MG tablet Take 1 tablet (1 mg total) by mouth daily. 04/10/23   Macario Dorothyann HERO, MD  furosemide  (LASIX ) 40 MG tablet Take 1 tablet (40 mg total) by mouth daily. 12/05/24   [place], PA-C  guaiFENesin  (MUCINEX ) 600 MG 12 hr tablet Take 1 tablet (600 mg total) by mouth 2 (two) times daily. 02/04/24   Norbert, John C, MD  ipratropium (ATROVENT ) 0.03 % nasal spray Place 2 sprays into both nostrils as needed for rhinitis. 10/29/23   Armbruster, Elspeth SQUIBB, MD  Multiple Vitamin (MULTIVITAMIN WITH MINERALS) TABS tablet Take 1 tablet by mouth daily. 04/10/23   Macario Dorothyann HERO, MD  ondansetron  (ZOFRAN -ODT) 4 MG disintegrating tablet Take 1 tablet (4 mg total) by mouth as needed for nausea or vomiting. 12/05/24   [place], PA-C  pantoprazole  (PROTONIX ) 20 MG tablet Take 1 tablet (20 mg total) by mouth daily. 12/05/24   [place], PA-C  potassium chloride  SA (KLOR-CON  M) 20 MEQ tablet Take 1 tablet (20 mEq total) by mouth 2 (two) times daily. 05/22/24   Odell Balls, PA-C  propranolol  (INDERAL ) 20 MG tablet Take 1 tablet (20 mg total) by mouth 2 (two) times daily. 12/05/24   [place],  PA-C  spironolactone  (ALDACTONE ) 100 MG tablet Take 1 tablet (100 mg total) by mouth daily. 12/05/24   [place], PA-C  sucralfate  (CARAFATE ) 1 g tablet Take 1 tablet (1 g total) by mouth 4 (four) times daily -  with meals and at bedtime. 05/22/24   Odell Balls, PA-C  thiamine  (VITAMIN B1) 100 MG tablet Take 1 tablet (100 mg total) by mouth daily. 04/10/23   Macario Dorothyann HERO, MD    Allergies: Patient has no known allergies.    Review of Systems  Gastrointestinal:  Positive for vomiting.    Updated Vital Signs BP (!) 151/93   Pulse 90   Temp 100 F (37.8 C) (Rectal)   Resp (!) 22   Ht 5' 8 (1.727 m)   Wt 70.3 kg   SpO2 99%   BMI 23.57 kg/m   Physical Exam Vitals and nursing note reviewed.  Constitutional:      General: He is not in acute distress.    Appearance: Normal appearance.  HENT:     Head: Normocephalic and atraumatic.  Eyes:     Conjunctiva/sclera: Conjunctivae normal.  Cardiovascular:     Rate and Rhythm: Tachycardia present.     Comments: 108bpm-150bpm Pulmonary:     Effort: Pulmonary effort is normal. No respiratory distress.  Abdominal:     General: Bowel sounds are normal. There is no distension.     Palpations: Abdomen is soft.     Tenderness: There is no abdominal  tenderness. There is no guarding or rebound.  Skin:    Coloration: Skin is not jaundiced or pale.  Neurological:     Mental Status: He is alert and oriented to person, place, and time. Mental status is at baseline.     (all labs ordered are listed, but only abnormal results are displayed) Labs Reviewed  CBC - Abnormal; Notable for the following components:      Result Value   WBC 3.6 (*)    Hemoglobin 11.2 (*)    HCT 31.9 (*)    MCV 70.7 (*)    MCH 24.8 (*)    RDW 16.3 (*)    Platelets 149 (*)    All other components within normal limits  BASIC METABOLIC PANEL WITH GFR - Abnormal; Notable for the following components:   Sodium 124 (*)    Chloride 85 (*)    Glucose, Bld  100 (*)    BUN <5 (*)    All other components within normal limits  URINALYSIS, ROUTINE W REFLEX MICROSCOPIC - Abnormal; Notable for the following components:   APPearance HAZY (*)    Hgb urine dipstick SMALL (*)    Nitrite POSITIVE (*)    Leukocytes,Ua TRACE (*)    Bacteria, UA MANY (*)    All other components within normal limits  URINE DRUG SCREEN - Abnormal; Notable for the following components:   Tetrahydrocannabinol POSITIVE (*)    All other components within normal limits  URINE CULTURE  CULTURE, BLOOD (ROUTINE X 2)  CULTURE, BLOOD (ROUTINE X 2)  ETHANOL  I-STAT CG4 LACTIC ACID, ED  I-STAT CG4 LACTIC ACID, ED  TROPONIN T, HIGH SENSITIVITY  TROPONIN T, HIGH SENSITIVITY    EKG: EKG Interpretation Date/Time:  Friday January 02 2025 18:20:07 EST Ventricular Rate:  158 PR Interval:  122 QRS Duration:  74 QT Interval:  240 QTC Calculation: 389 R Axis:   -29  Text Interpretation: Sinus tachycardia Possible Anterior infarct , age undetermined Abnormal ECG When compared with ECG of 20-Jun-2024 05:42, PREVIOUS ECG IS PRESENT Since last tracing rate faster Confirmed by Dean Clarity [phone]) on 01/02/2025 9:09:44 PM  Radiology: [facility] Ribs Unilateral W/Chest Left Result Date: 01/02/2025 CLINICAL DATA:  Fall with arm pain EXAM: LEFT RIBS AND CHEST - 3+ VIEW COMPARISON:  05/22/2024 FINDINGS: Single-view chest demonstrates no acute airspace disease or effusion. Normal cardiac size. No pneumothorax. Left rib series demonstrates no acute displaced left rib fracture IMPRESSION: Negative. Electronically Signed   By: Luke Bun M.D.   On: 01/02/2025 21:45   DG Elbow Complete Left Result Date: 01/02/2025 CLINICAL DATA:  Arm pain EXAM: LEFT ELBOW - COMPLETE 3+ VIEW COMPARISON:  None Available. FINDINGS: No fracture or malalignment. No significant effusion. Mild spurring at the coronoid process IMPRESSION: No acute osseous abnormality. Electronically Signed   By: Luke Bun M.D.   On:  01/02/2025 21:44   DG Humerus Left Result Date: 01/02/2025 CLINICAL DATA:  Fall with arm pain EXAM: LEFT HUMERUS - 2+ VIEW COMPARISON:  Chest x-ray 01/05/2020 FINDINGS: No definitive fracture or malalignment. 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 Probable ghost track in the left humeral head/greater tuberosity region. IMPRESSION: No acute osseous abnormality. Electronically Signed   By: Luke Bun M.D.   On: 01/02/2025 21:43   DG Shoulder Left Result Date: 01/02/2025 CLINICAL DATA:  Fall with arm pain EXAM: LEFT SHOULDER - 2+ VIEW COMPARISON:  Chest x-ray 01/05/2020 FINDINGS: No definite acute displaced fracture or malalignment. AC joint and glenohumeral  degenerative change. Prominent calcific tendinopathy. Amorphous soft tissue and bony calcification about the proximal humerus is chronic and could reflect remote trauma/heterotopic ossification. IMPRESSION: 1. No definite acute osseous abnormality. 2. AC joint and glenohumeral degenerative change. Prominent calcific tendinopathy. Electronically Signed   By: Luke Bun M.D.   On: 01/02/2025 21:42    {Document cardiac monitor, telemetry assessment procedure when appropriate:32947} .Critical Care  Performed by: Minnie Tinnie BRAVO, PA Authorized by: Minnie Tinnie BRAVO, PA   Critical care provider statement:    Critical care time (minutes):  30   Critical care was necessary to treat or prevent imminent or life-threatening deterioration of the following conditions:  Sepsis   Critical care was time spent personally by me on the following activities:  Development of treatment plan with patient or surrogate, discussions with consultants, evaluation of patient's response to treatment, examination of patient, ordering and review of laboratory studies, ordering and review of radiographic studies, ordering and performing treatments and interventions, pulse oximetry, re-evaluation of patient's condition and review of old charts   Care  discussed with: admitting provider      Medications Ordered in the ED  oxyCODONE-acetaminophen  (PERCOCET/ROXICET) 5-325 MG per tablet 1 tablet (1 tablet Oral Given 01/02/25 2058)  lactated ringers  bolus 1,000 mL (1,000 mLs Intravenous New Bag/Given 01/02/25 2231)  cefTRIAXone  (ROCEPHIN ) 1 g in sodium chloride  0.9 % 100 mL IVPB (0 g Intravenous Stopped 01/02/25 2221)  ibuprofen  (ADVIL ) tablet 600 mg (600 mg Oral Given 01/02/25 2148)      {Click here for ABCD2, HEART and other calculators REFRESH Note before signing:1}                              Medical Decision Making Amount and/or Complexity of Data Reviewed Labs: ordered. Radiology: ordered.  Risk Prescription drug management.     Patient presents to the ED for concern of ***, this involves an extensive number of treatment options, and is a complaint that carries with it a high risk of complications and morbidity.  The differential diagnosis includes ***   Co morbidities that complicate the patient evaluation  ***   Additional history obtained:  Additional history obtained from *** {Blank multiple:19196::EMS,Family,Nursing,Outside Medical Records,Past Admission}   External records from outside source obtained and reviewed including ***   Lab Tests:  I Ordered, and personally interpreted labs.  The pertinent results include:  ***   Imaging Studies ordered:  I ordered imaging studies including ***  I independently visualized and interpreted imaging which showed *** I agree with the radiologist interpretation   Cardiac Monitoring:  The patient was maintained on a cardiac monitor.  I personally viewed and interpreted the cardiac monitored which showed an underlying rhythm of: ***   Medicines ordered and prescription drug management:  I ordered medication including ***  for ***  Reevaluation of the patient after these medicines showed that the patient {resolved/improved/worsened:23923::improved} I  have reviewed the patients home medicines and have made adjustments as needed   Test Considered:  ***   Critical Interventions:  ***   Consultations Obtained:  I requested consultation with the FM service Resident Zheng MD staffed with Dr. Rumball,  and discussed lab and imaging findings as well as pertinent plan - they accept patient for admission   Problem List / ED Course:  Sepsis UTI Vital signs notable for tachycardia trending from 150-116bpm.  Initial temperature was 97.8 F Pancytopenia Provided patient with IVF, Rocephin , Motrin   for hydration, UTI, fever.   Started with Motrin  for fever as patient has a history of alcoholic cirrhosis.  Last liver enzymes noted to be AST 40 on 10/21/2024.  No improvement of tachycardia.  Temperature increased to 100F  Fall Left arm pain Left shoulder, left humerus, left elbow wo fracture nor dislocation No signs of vascular injury. Radial 2+  ETOH abuse No complaints of NV, skin disturbances, hallucinations, HA, CP, anxiety No tremors Last drink was at noon Ethanol negative Last CMP on 10/21/24   Hyponatremia Chronically low 2/2 etoh abuse   Reevaluation:  After the interventions noted above, I reevaluated the patient and found that they have :improved    Dispostion:  After consideration of the diagnostic results and the patients response to treatment, I feel that the patent would benefit from admission for sepsis 2/2 UTI.   Discussed ED workup with patient who expresses understanding and agrees to plan  Final diagnoses:  Left arm pain  Nausea and vomiting, unspecified vomiting type  Sepsis, due to unspecified organism, unspecified whether acute organ dysfunction present [facility])  Urinary tract infection with hematuria, site unspecified  Hyponatremia    ED Discharge Orders     None      "

## 2025-01-05 ENCOUNTER — Other Ambulatory Visit (HOSPITAL_COMMUNITY): Payer: Self-pay

## 2025-01-05 ENCOUNTER — Telehealth: Payer: Self-pay

## 2025-01-05 NOTE — Transitions of Care (Post Inpatient/ED Visit) (Addendum)
 "  01/05/2025  Name: Stephen Carroll MRN: [id number] DOB: 1966/04/17  Today's TOC FU Call Status: Today's TOC FU Call Status:: Successful TOC FU Call Completed TOC FU Call Complete Date: 01/05/25  Patient's Name and Date of Birth confirmed. Name, DOB  Transition Care Management Follow-up Telephone Call Date of Discharge: 01/03/25 Discharge Facility: [facility]) Type of Discharge: Inpatient Admission Primary Inpatient Discharge Diagnosis:: Tachycardia How have you been since you were released from the hospital?: Better Any questions or concerns?: No  Items Reviewed: Did you receive and understand the discharge instructions provided?: Yes (But had left paperwork in room before discharging. Reviewed AVS with patient, medications, follow up appointments, to make PCP HFU appointement.) Medications obtained,verified, and reconciled?: Yes (Medications Reviewed) Any new allergies since your discharge?: No Dietary orders reviewed?: NA Do you have support at home?: Yes People in Home [RPT]: sibling(s) Name of Support/Comfort Primary Source: Johnson,Reginald  Brother, Emergency Contact  [phone] (Mobile)  Medications Reviewed Today: Medications Reviewed Today     Reviewed by Carolee Heron NOVAK, RN (Case Manager) on 01/05/25 at 1148  Med List Status: <None>   Medication Order Taking? Sig Documenting Provider Last Dose Status Informant  furosemide  (LASIX ) 40 MG tablet [id number] Yes Take 1 tablet (40 mg total) by mouth daily. [place], PA-C  Active Self, Pharmacy Records  Menthol-Methyl Salicylate (ICY HOT ORIGINAL PAIN RELIEF) 10-30 % CREA [id number] Yes Apply 1 Application topically as needed (muscle and joint pain). [provider]  Active Self, Pharmacy Records  naproxen sodium (ALEVE) 220 MG tablet [id number] Yes Take 220 mg by mouth daily as needed (pain/headache). [provider]  Active Self, Pharmacy Records  ondansetron  (ZOFRAN -ODT) 4 MG disintegrating tablet  [id number] Yes Take 1 tablet (4 mg total) by mouth as needed for nausea or vomiting. [place], PA-C  Active Self, Pharmacy Records           Med Note LEOBARDO, [facility]   Dju Jan 03, 2025  4:34 AM) Needs to refill.   pantoprazole  (PROTONIX ) 20 MG tablet [id number] Yes Take 1 tablet (20 mg total) by mouth daily. [place], PA-C  Active Self, Pharmacy Records  potassium chloride  SA (KLOR-CON  M) 20 MEQ tablet [id number] Yes Take 1 tablet (20 mEq total) by mouth 2 (two) times daily. Bhagat, Virali, DO  Active   propranolol  (INDERAL ) 20 MG tablet [id number] Yes Take 1 tablet (20 mg total) by mouth 2 (two) times daily. [place], PA-C  Active Self, Pharmacy Records  spironolactone  (ALDACTONE ) 100 MG tablet [id number] Yes Take 1 tablet (100 mg total) by mouth daily. [place], PA-C  Active Self, Pharmacy Records  tetrahydrozoline 0.05 % ophthalmic solution [id number] Yes Place 2 drops into both eyes 2 (two) times daily. [provider]  Active Self, Pharmacy Records            Home Care and Equipment/Supplies: Were Home Health Services Ordered?: No Any new equipment or medical supplies ordered?: No  Functional Questionnaire: Do you need assistance with bathing/showering or dressing?: No Do you need assistance with meal preparation?: No Do you need assistance with eating?: No Do you have difficulty maintaining continence: No Do you need assistance with getting out of bed/getting out of a chair/moving?: No Do you have difficulty managing or taking your medications?: No  Follow up appointments reviewed: PCP Follow-up appointment confirmed?: No (Patient or sister to call, confirmed PCP/Office with patient.) Specialist Hospital Follow-up appointment confirmed?: NA Do you need transportation to your follow-up appointment?: No Do  you understand care options if your condition(s) worsen?: Yes-patient verbalized understanding  SDOH Interventions Today    Flowsheet Row Most Recent Value   SDOH Interventions   Food Insecurity Interventions Other (Comment)  [CCM RN CM following and BSW following for SDOH needs. Patient stated on this call he was straight with food needs when asked.]  Housing Interventions Other (Comment)  [BSW following.]  Transportation Interventions Patient Resources (Friends/Family), Payor Benefit  Utilities Interventions Other (Comment)  [BSW following.]   01/05/2025: Successful TOC RN CM post discharge outreach with patient. Patient verbally declined follow up calls and was brief on this call.   Patient is being followed by CCM RN CM and BSW for prior SDOH needs.  Patient stated he was straight with any food needs now.  The patient has been provided with contact information for the care management team and has been advised to call if decides to enroll in follow up call for TOC 30 day program.  The patient verbalized understanding with current POC and to call to schedule a PCP hospital follow up or HFU.  Confirmed PCP with patient.  Patient does not have MyChart but declined at this time.  The patient is directed to their insurance card regarding availability of benefits coverage. Patient had questions that were addressed concerning his AVS paperwork and it was reviewed with patient including follow ups, medications, what to call provider for, and any other discharge instructions.  Medications to be picked up at [facility] but patient has current medications at home due to weather/road conditions today.  No red flags notes on assessment or in any of patient statements.   Chart note routed via EPIC to CCM RN and BSW for [employer].   Bing Edison MSN, RN RN [employer]  [facility] Office Hours M-F [phone] Direct Dial: [phone] Main Phone [phone]  Fax: [phone] [email or web address]    "

## 2025-01-06 ENCOUNTER — Other Ambulatory Visit (HOSPITAL_COMMUNITY): Payer: Self-pay

## 2025-01-08 ENCOUNTER — Other Ambulatory Visit (HOSPITAL_COMMUNITY): Payer: Self-pay

## 2025-01-08 LAB — CULTURE, BLOOD (ROUTINE X 2)
Culture: NO GROWTH
Culture: NO GROWTH
Special Requests: ADEQUATE
Special Requests: ADEQUATE

## 2025-01-16 ENCOUNTER — Telehealth: Payer: Self-pay | Admitting: *Deleted

## 2025-02-17 ENCOUNTER — Telehealth: Payer: Self-pay | Admitting: *Deleted

## 2025-03-17 ENCOUNTER — Ambulatory Visit: Admitting: Podiatry
